# Patient Record
Sex: Female | Born: 1975 | Race: White | Hispanic: No | Marital: Married | Smoking: Former smoker
Health system: Southern US, Community
[De-identification: ages and names within clinical notes are randomized; demographics above are authoritative.]

## PROBLEM LIST (undated history)

## (undated) DIAGNOSIS — G894 Chronic pain syndrome: Secondary | ICD-10-CM

## (undated) DIAGNOSIS — N189 Chronic kidney disease, unspecified: Secondary | ICD-10-CM

## (undated) DIAGNOSIS — M797 Fibromyalgia: Secondary | ICD-10-CM

## (undated) DIAGNOSIS — G43909 Migraine, unspecified, not intractable, without status migrainosus: Secondary | ICD-10-CM

## (undated) DIAGNOSIS — I951 Orthostatic hypotension: Secondary | ICD-10-CM

## (undated) DIAGNOSIS — G5600 Carpal tunnel syndrome, unspecified upper limb: Secondary | ICD-10-CM

## (undated) DIAGNOSIS — I471 Supraventricular tachycardia, unspecified: Secondary | ICD-10-CM

## (undated) DIAGNOSIS — G43009 Migraine without aura, not intractable, without status migrainosus: Secondary | ICD-10-CM

## (undated) DIAGNOSIS — R002 Palpitations: Secondary | ICD-10-CM

## (undated) DIAGNOSIS — L4050 Arthropathic psoriasis, unspecified: Secondary | ICD-10-CM

## (undated) DIAGNOSIS — I341 Nonrheumatic mitral (valve) prolapse: Secondary | ICD-10-CM

## (undated) DIAGNOSIS — M5416 Radiculopathy, lumbar region: Secondary | ICD-10-CM

## (undated) DIAGNOSIS — M659 Unspecified synovitis and tenosynovitis, unspecified site: Secondary | ICD-10-CM

## (undated) DIAGNOSIS — I10 Essential (primary) hypertension: Secondary | ICD-10-CM

## (undated) DIAGNOSIS — E7800 Pure hypercholesterolemia, unspecified: Secondary | ICD-10-CM

## (undated) DIAGNOSIS — R0789 Other chest pain: Secondary | ICD-10-CM

## (undated) DIAGNOSIS — M533 Sacrococcygeal disorders, not elsewhere classified: Secondary | ICD-10-CM

## (undated) HISTORY — PX: CERVICAL SPINE SURGERY: SHX589

## (undated) HISTORY — DX: Chronic kidney disease, unspecified: N18.9

## (undated) HISTORY — PX: CARDIAC SURGERY: SHX584

## (undated) HISTORY — PX: WRIST SURGERY: SHX841

## (undated) HISTORY — PX: ABDOMINAL HYSTERECTOMY: SHX81

## (undated) HISTORY — PX: CARDIAC ELECTROPHYSIOLOGY MAPPING AND ABLATION: SHX1292

---

## 2014-02-13 ENCOUNTER — Ambulatory Visit: Payer: Self-pay

## 2014-07-03 ENCOUNTER — Ambulatory Visit: Payer: Self-pay | Admitting: Physician Assistant

## 2014-09-09 IMAGING — CR DG SHOULDER 2+V*R*
3 series · 3 of 3 positions shown · non-contrast
Comparison: None.

CLINICAL DATA: Right shoulder pain

EXAM:
RIGHT SHOULDER - 2+ VIEW

[si joints obli]
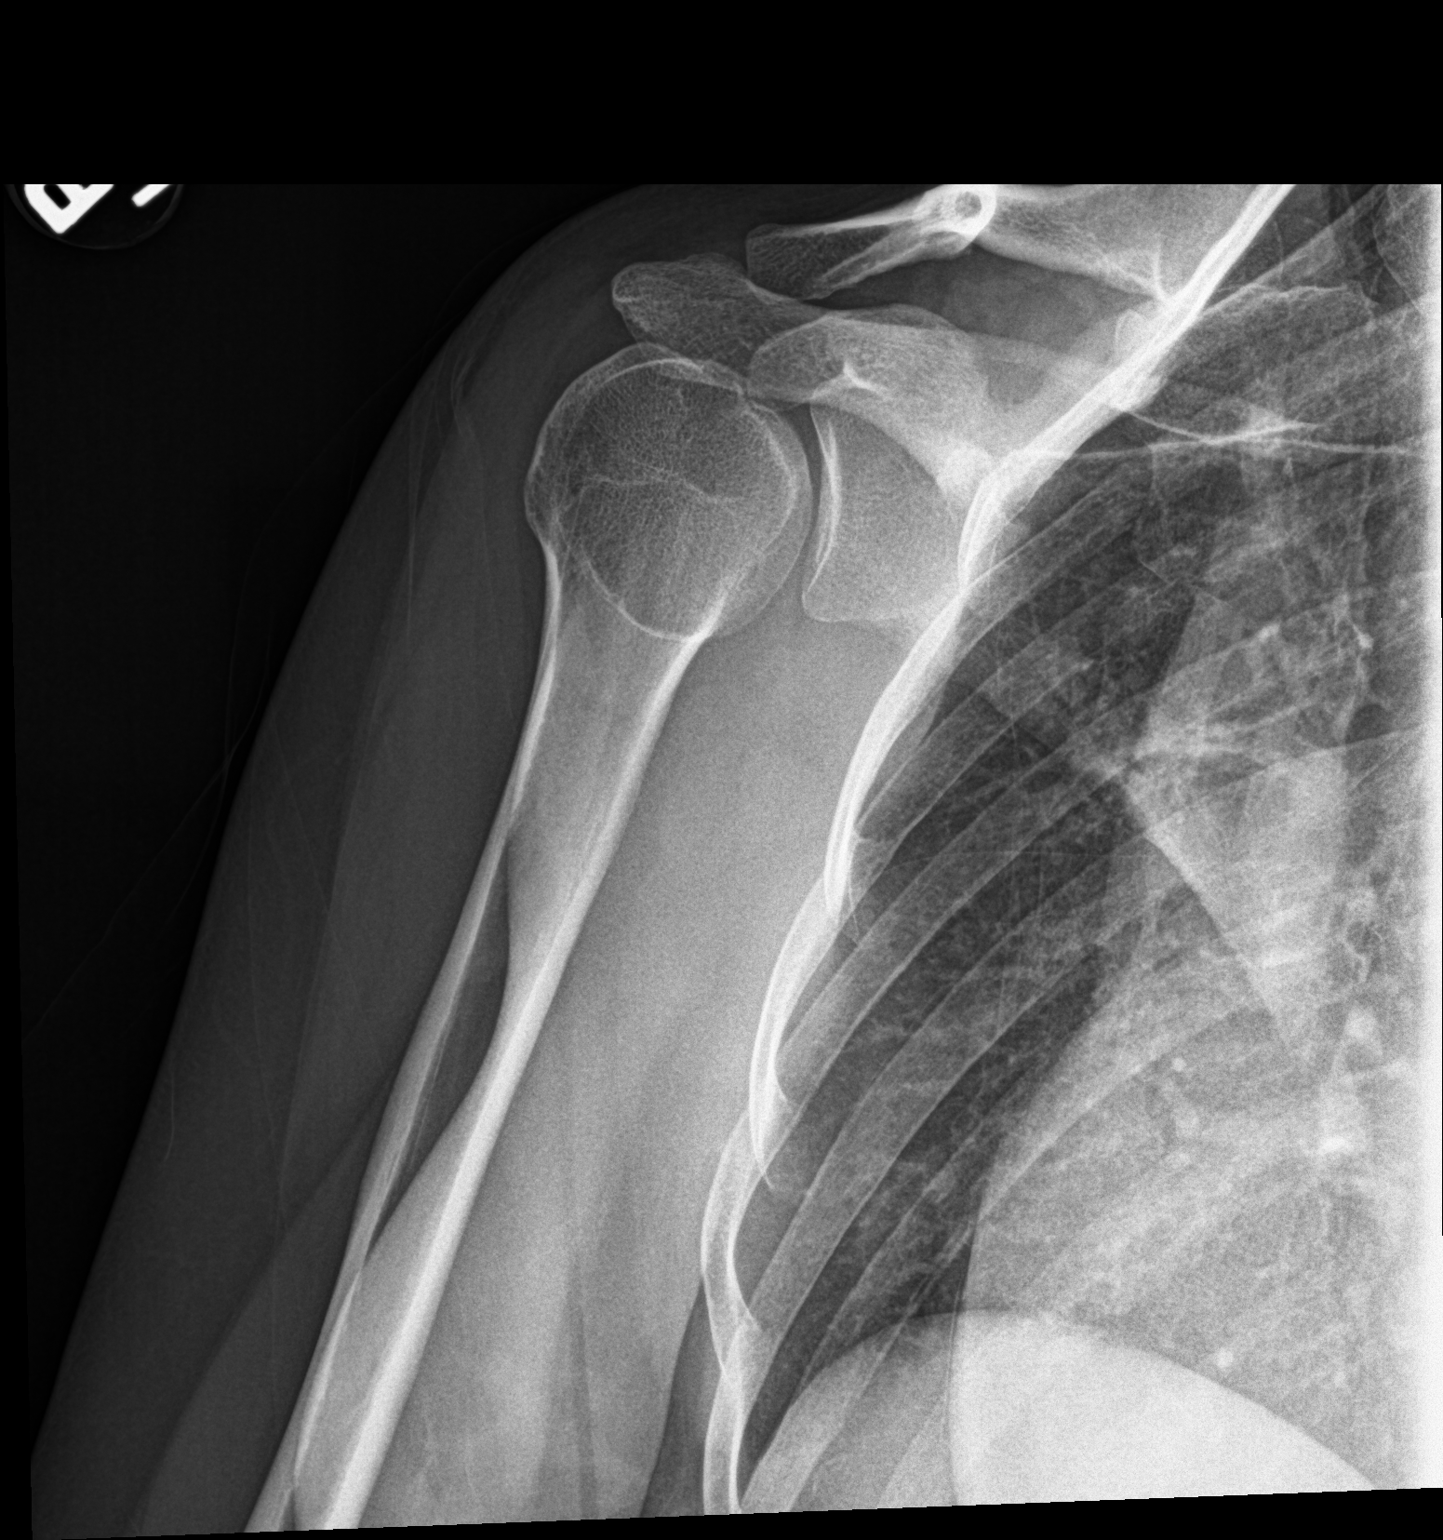

[shoulder axial]
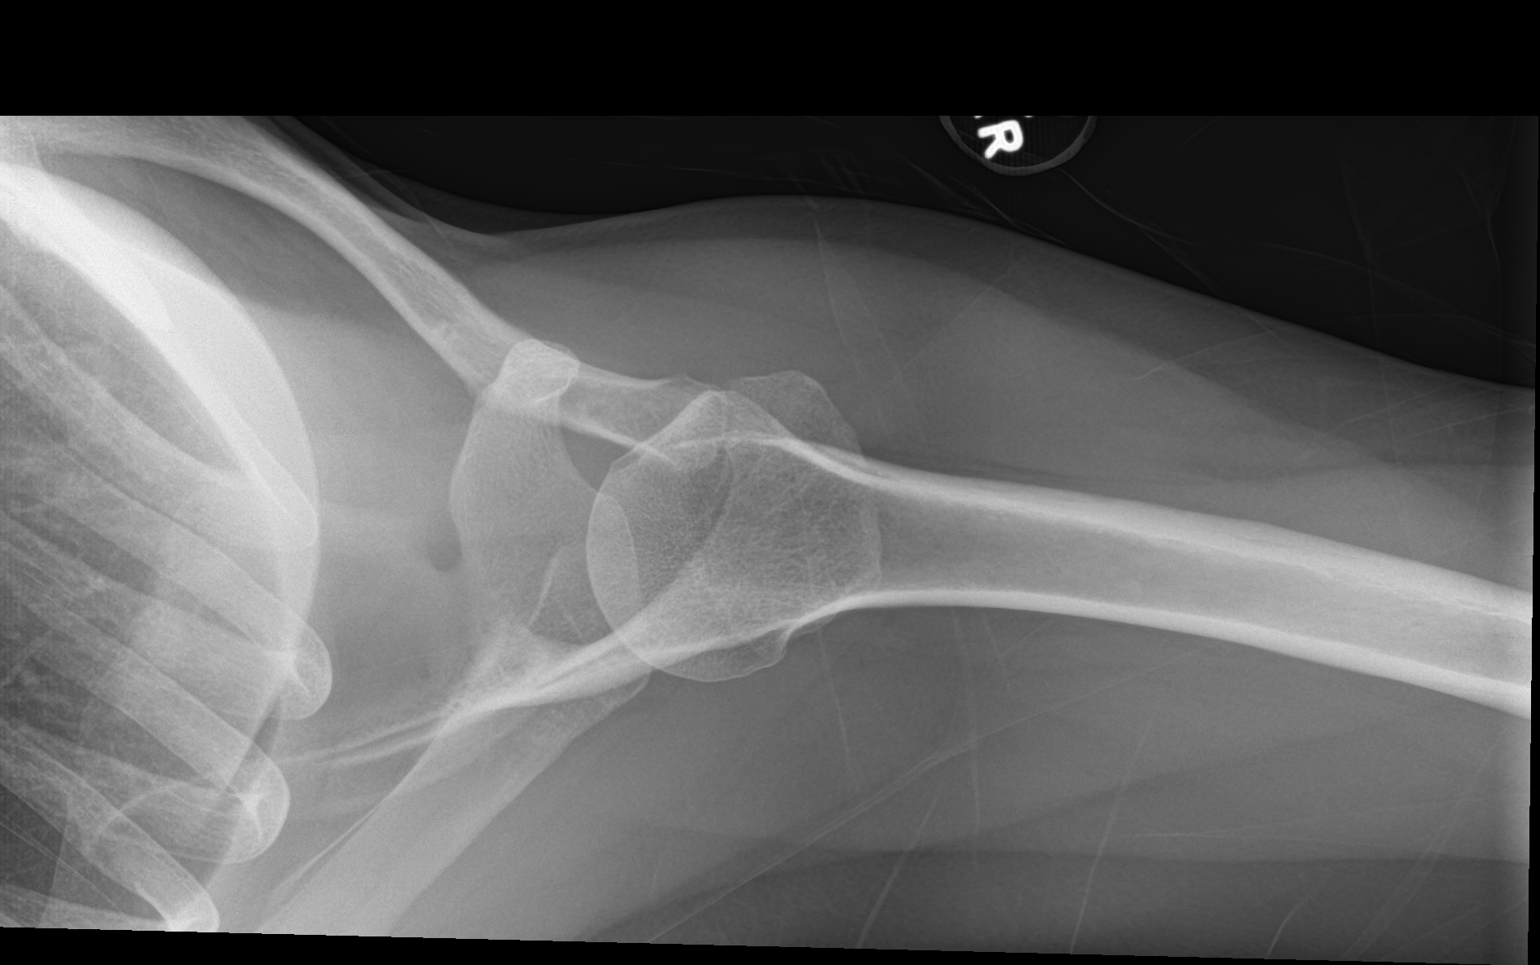

[shoulder ap internal]
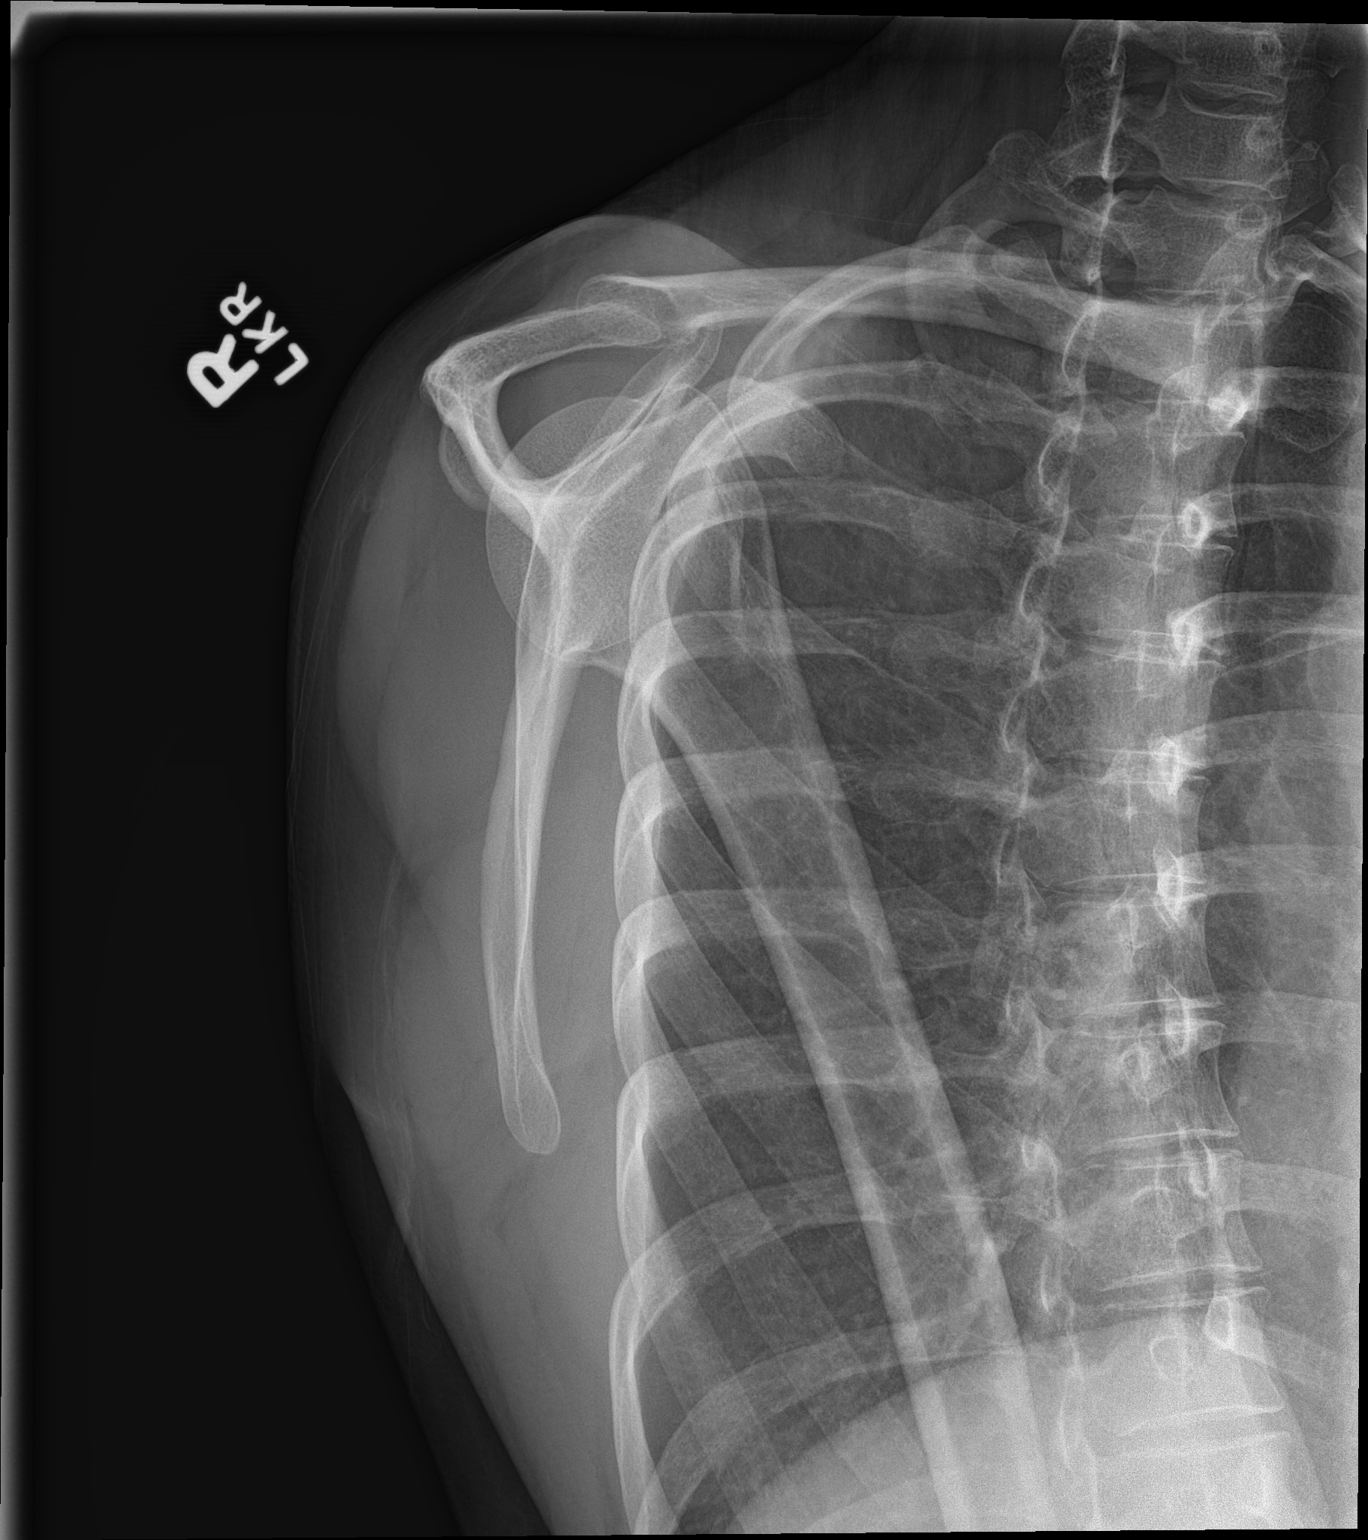

[3 of 3 positions shown; findings below may reference images not displayed]

FINDINGS: There is no evidence of fracture or dislocation. There is no
evidence of arthropathy or other focal bone abnormality. Soft
tissues are unremarkable.
IMPRESSION: Negative.

## 2015-02-20 IMAGING — US US BREAST*L* LIMITED INC AXILLA
1 series · 11 of 11 positions shown · non-contrast
Comparison: Previous exam(s).

CLINICAL DATA: Screening recall for possible bilateral masses from
baseline exam. Additionally, the patient describes diffuse nonfocal
bilateral breast pain.

EXAM:
2D DIGITAL DIAGNOSTIC BILATERAL MAMMOGRAM WITH CAD AND ADJUNCT TOMO
BILATERAL BREAST ULTRASOUND

[Series 1: us breast*left* limited inc axilla · 0.06mm/px · 11 of 11 slices shown]
[im 1/11]
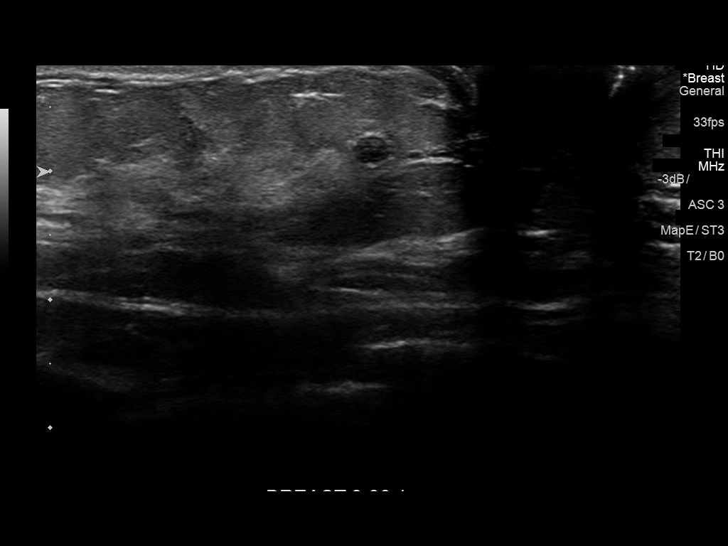
[im 2/11]
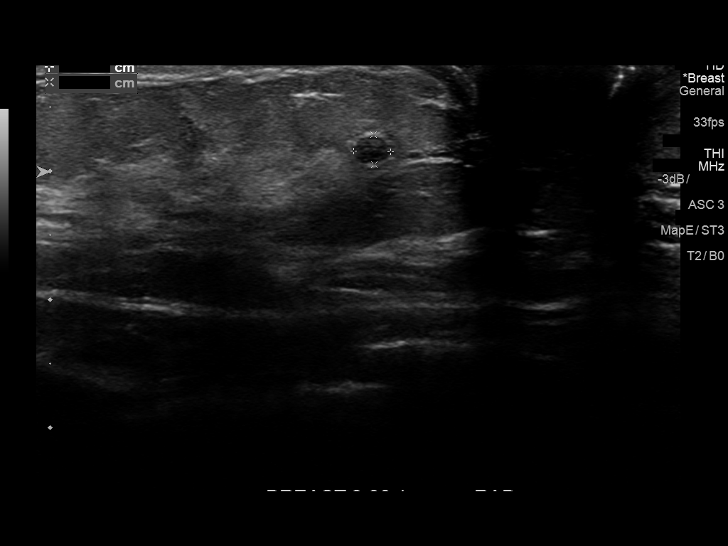
[im 3/11]
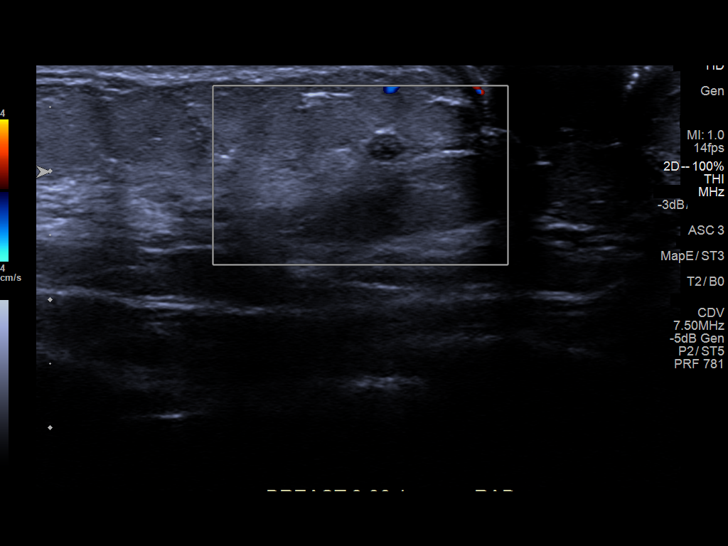
[im 4/11]
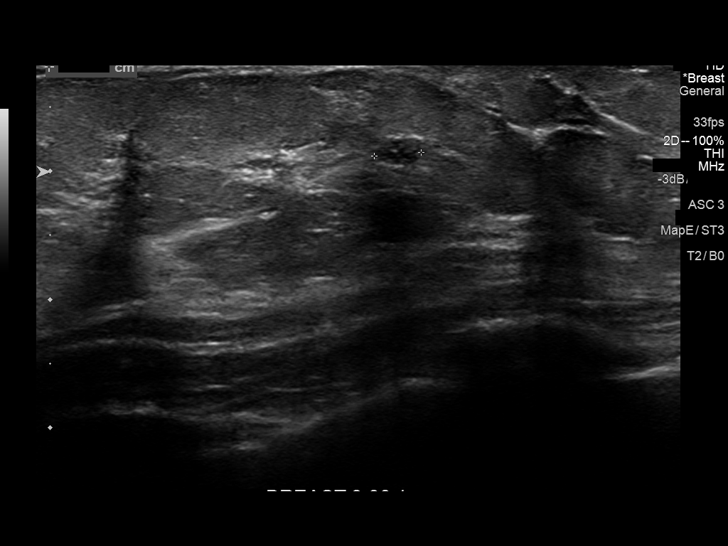
[im 5/11]
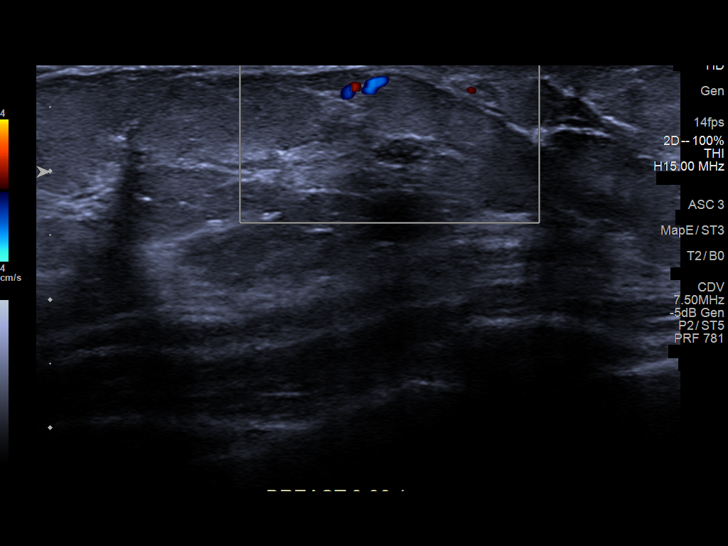
[im 6/11]
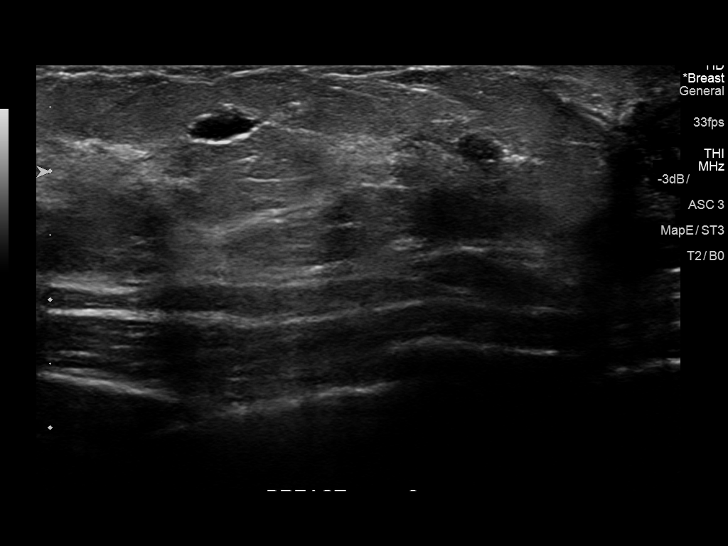
[im 7/11]
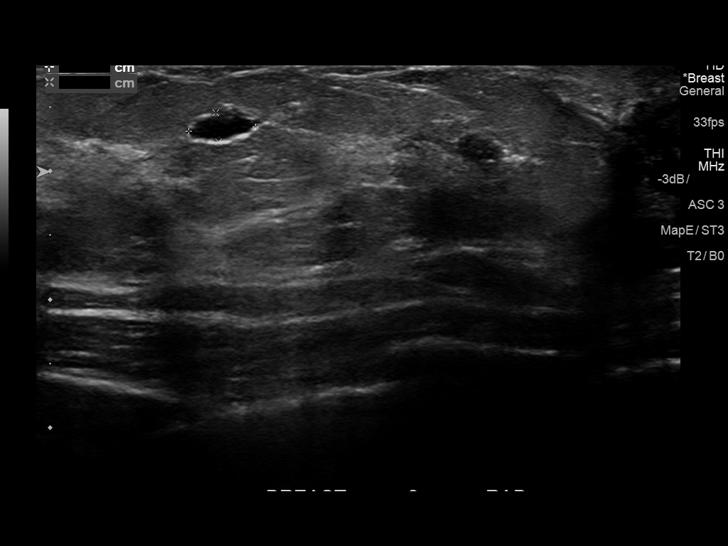
[im 8/11]
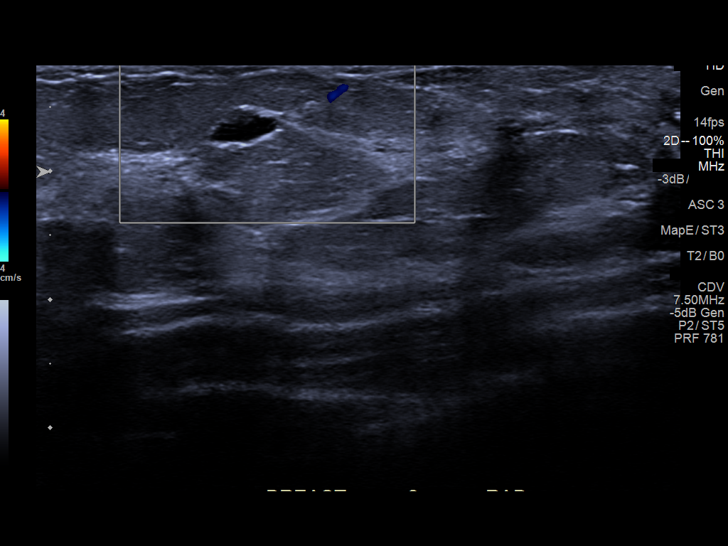
[im 9/11]
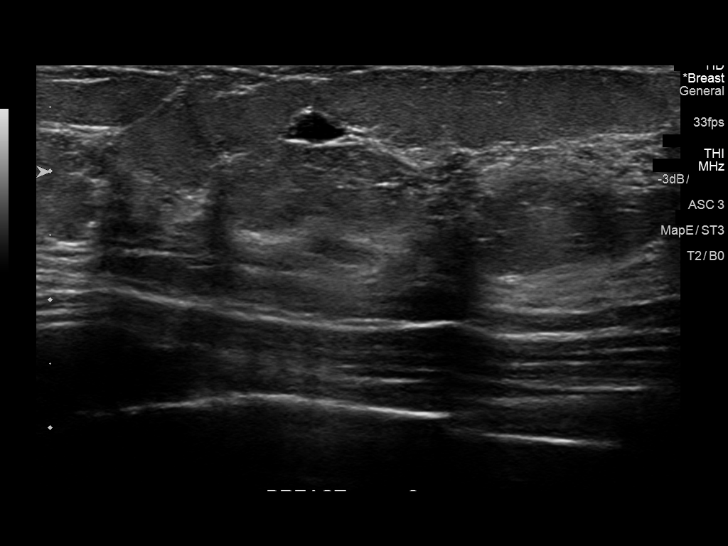
[im 10/11]
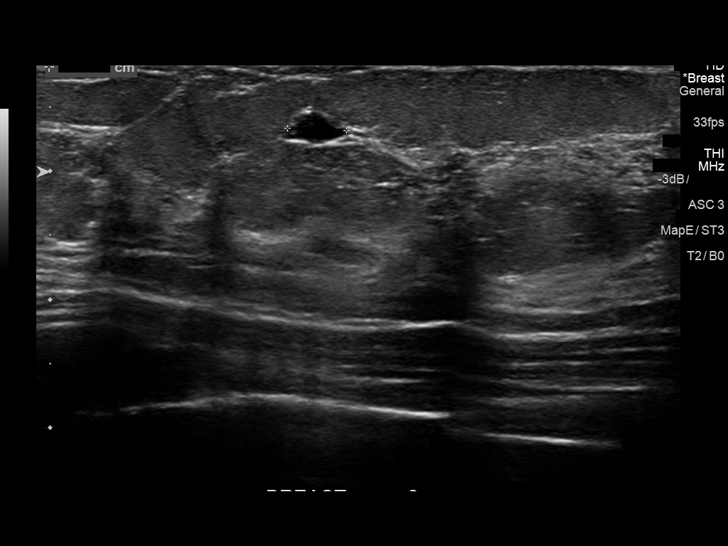
[im 11/11]
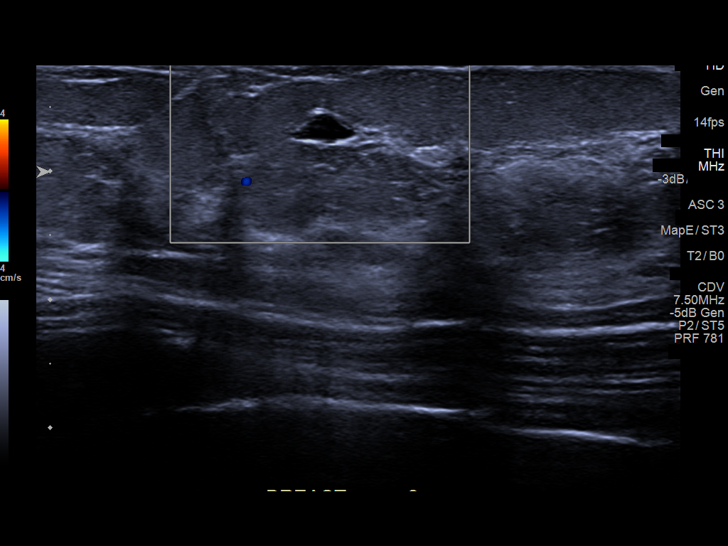

[11 of 11 positions shown; findings below may reference images not displayed]

ACR Breast Density Category c: The breast tissue is heterogeneously
dense, which may obscure small masses.
FINDINGS: No definite masses are identified on the additional spot compression
tomosynthesis images of the right breast, however due to the density
in this region, ultrasound will be performed for further evaluation.
In the slightly medial left breast, posterior depth there is a
circumscribed oval mass measuring 5 mm.

Mammographic images were processed with CAD.

Physical exam of the lower outer and lower inner right breast
demonstrates no discrete palpable masses.

Physical exam of the medial left breast demonstrates no discrete
palpable masses.

Ultrasound of the inferior right breast which was scanned from the 4
o'clock to the 8 o'clock position demonstrates multiple
circumscribed oval anechoic masses compatible with benign cysts. No
solid masses or suspicious areas of shadowing are identified.

Ultrasound of the medial left breast demonstrates a few small
anechoic circumscribed masses. There is a 5 mm hypoechoic mass at 9
o'clock, 1 cm from the nipple, likely representing a complicated
cyst.
IMPRESSION: 1. There is a 5 mm probably benign mass in the left breast at 9
o'clock, favored to represent a complicated cyst.

2. Benign bilateral fibrocystic change is noted. No mammographic
evidence of malignancy in the bilateral breasts.

RECOMMENDATION:
Six-month follow-up ultrasound of the left breast is recommended to
ensure continued stability of the probably benign 5 mm mass at 9
o'clock.

I have discussed the findings and recommendations with the patient.
Results were also provided in writing at the conclusion of the
visit. If applicable, a reminder letter will be sent to the patient
regarding the next appointment.

BI-RADS CATEGORY  3: Probably benign.

## 2015-02-20 IMAGING — US US BREAST*R* LIMITED INC AXILLA
1 series · 13 of 24 positions shown · non-contrast
Comparison: Previous exam(s).

CLINICAL DATA: Screening recall for possible bilateral masses from
baseline exam. Additionally, the patient describes diffuse nonfocal
bilateral breast pain.

EXAM:
2D DIGITAL DIAGNOSTIC BILATERAL MAMMOGRAM WITH CAD AND ADJUNCT TOMO
BILATERAL BREAST ULTRASOUND

[Series 1: us breast*right* limited inc axilla · 0.06mm/px · 13 of 24 slices shown]
[im 1/24]
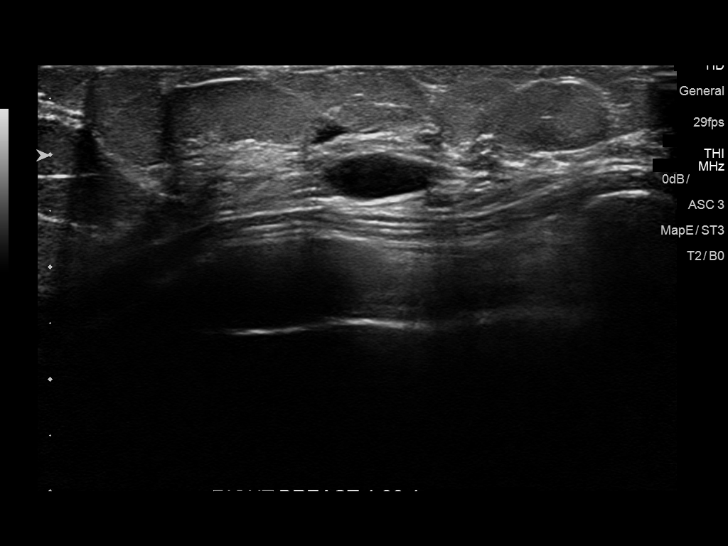
[im 3/24]
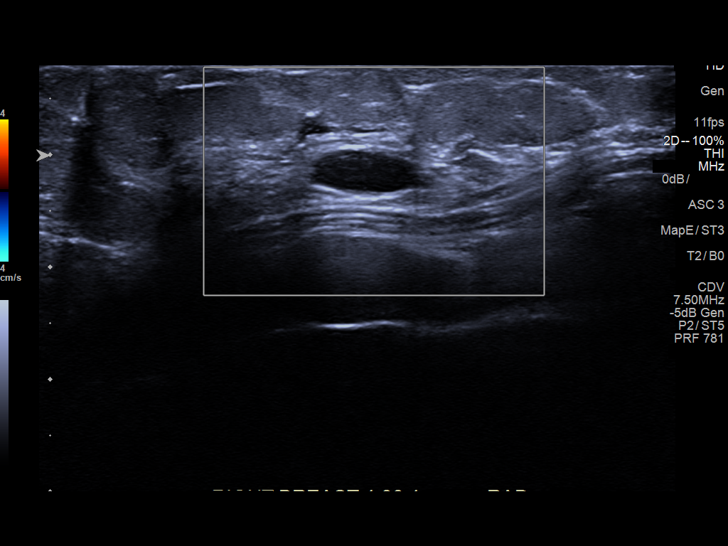
[im 5/24]
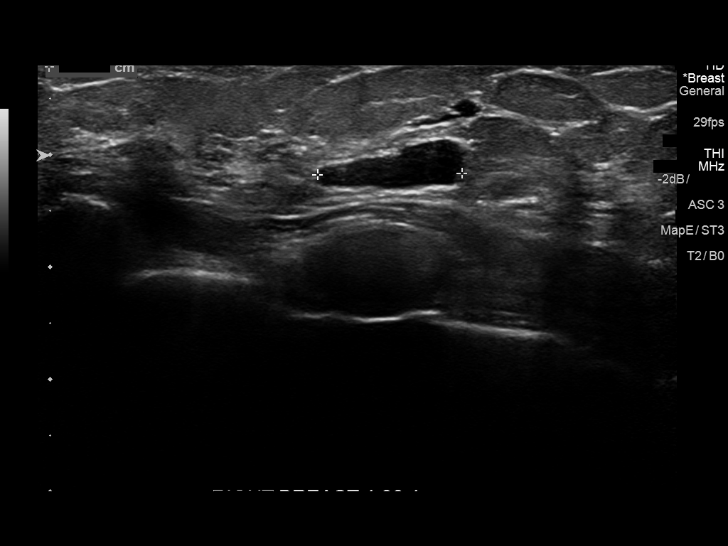
[im 7/24]
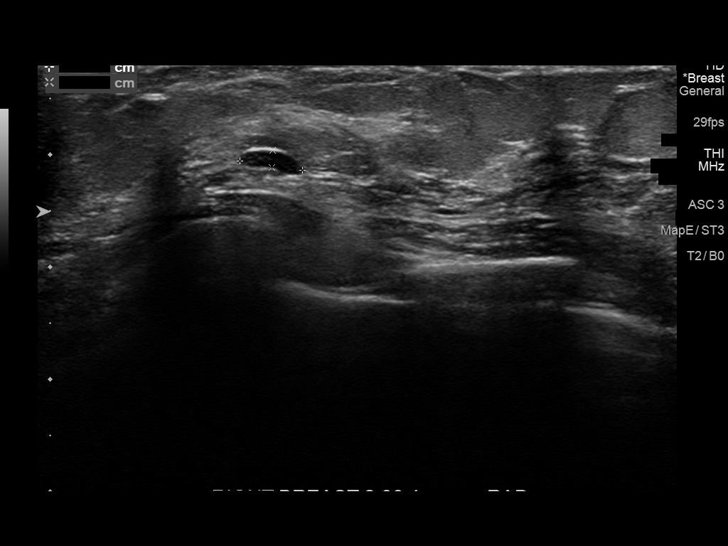
[im 9/24]
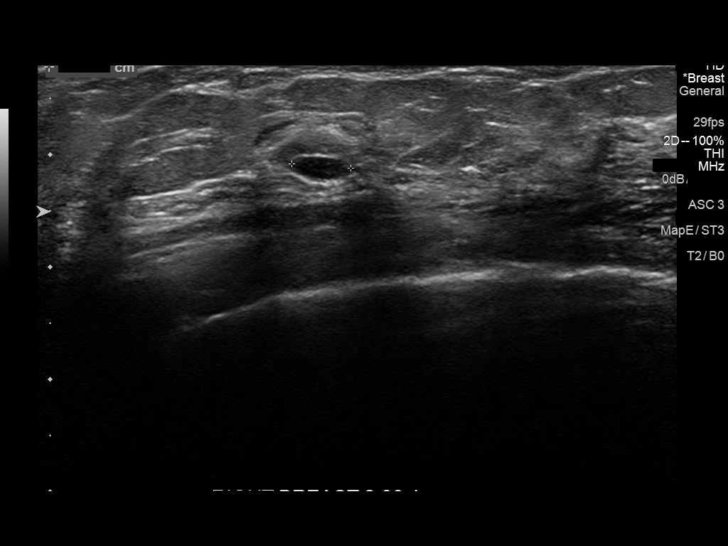
[im 11/24]
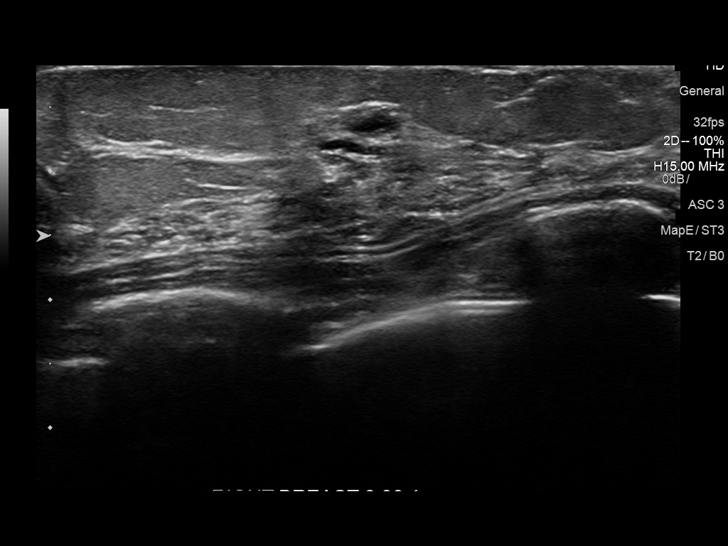
[im 13/24]
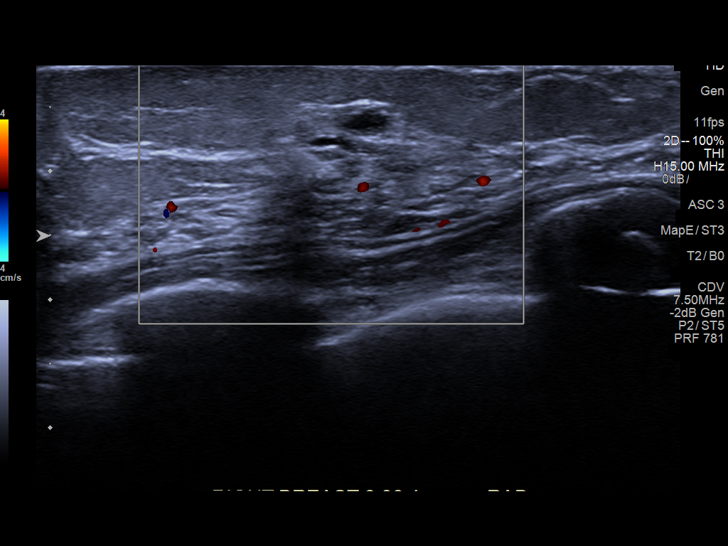
[im 14/24]
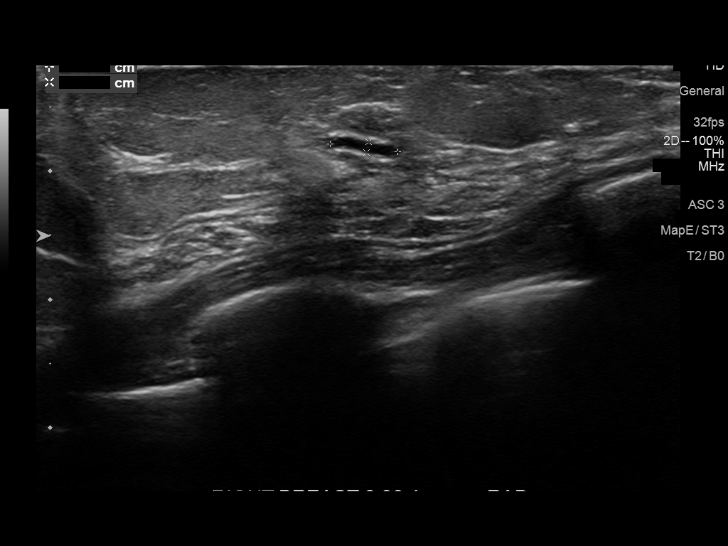
[im 16/24]
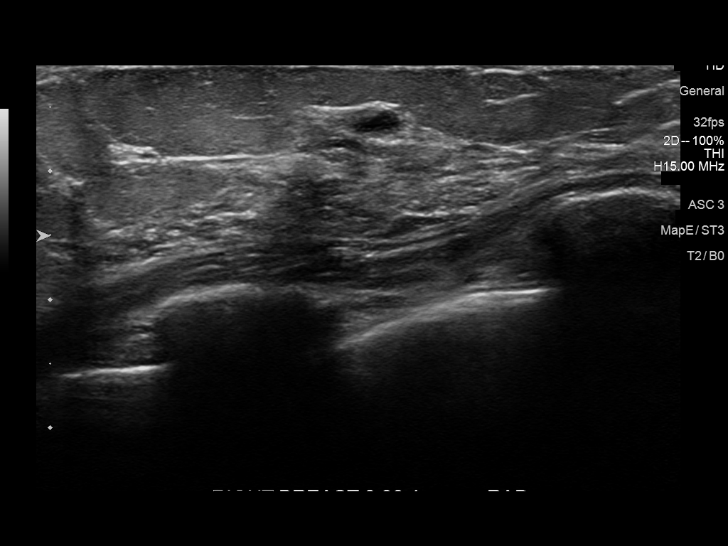
[im 18/24]
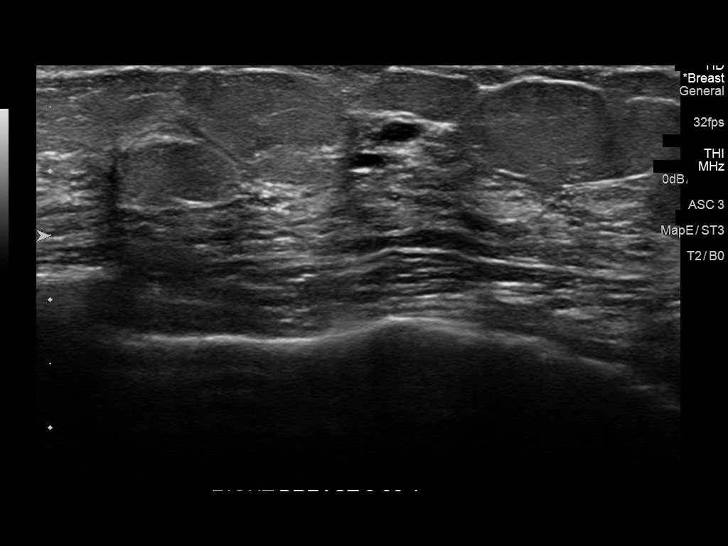
[im 20/24]
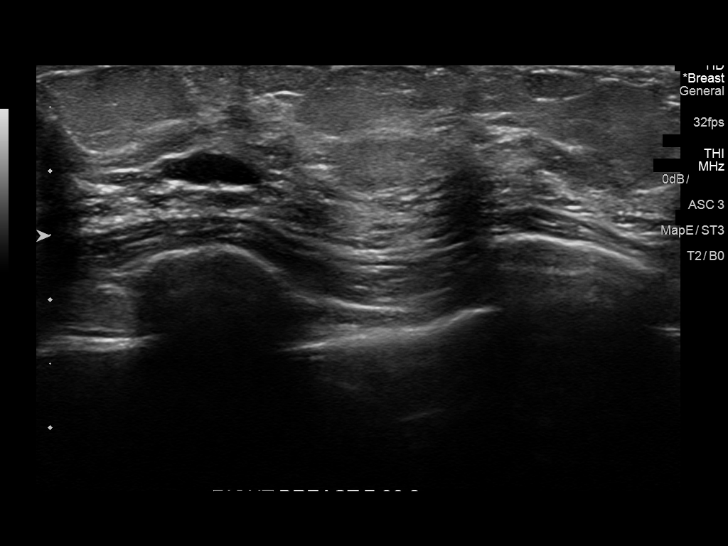
[im 22/24]
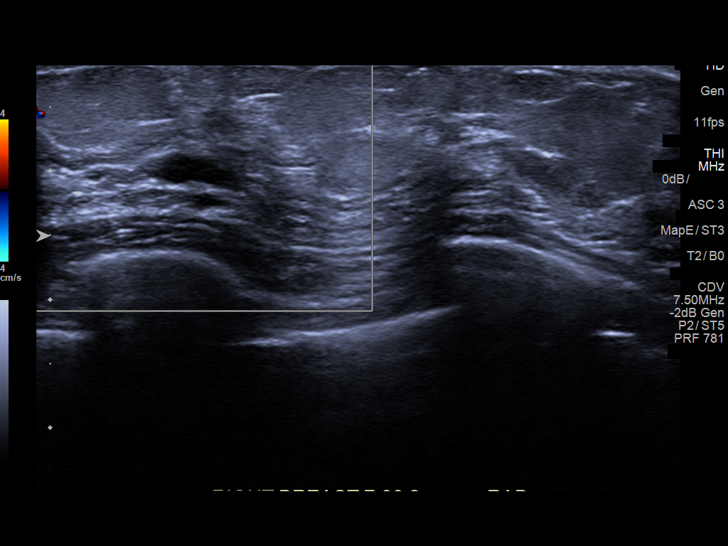
[im 24/24]
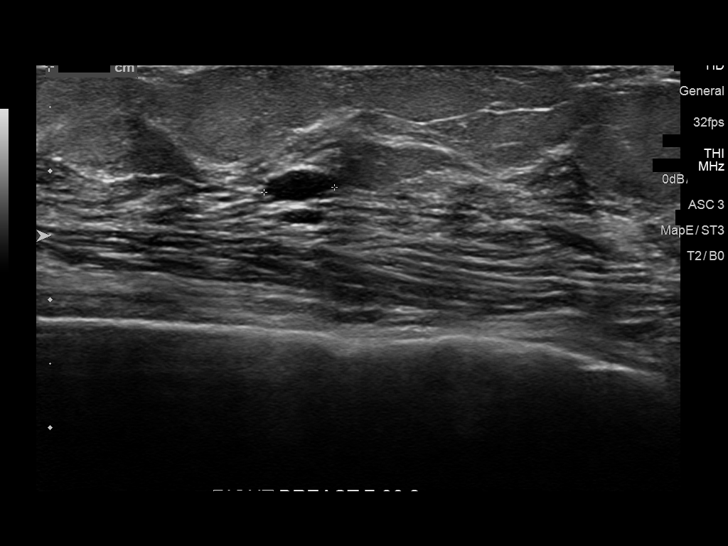

[13 of 24 positions shown; findings below may reference images not displayed]

ACR Breast Density Category c: The breast tissue is heterogeneously
dense, which may obscure small masses.
FINDINGS: No definite masses are identified on the additional spot compression
tomosynthesis images of the right breast, however due to the density
in this region, ultrasound will be performed for further evaluation.
In the slightly medial left breast, posterior depth there is a
circumscribed oval mass measuring 5 mm.

Mammographic images were processed with CAD.

Physical exam of the lower outer and lower inner right breast
demonstrates no discrete palpable masses.

Physical exam of the medial left breast demonstrates no discrete
palpable masses.

Ultrasound of the inferior right breast which was scanned from the 4
o'clock to the 8 o'clock position demonstrates multiple
circumscribed oval anechoic masses compatible with benign cysts. No
solid masses or suspicious areas of shadowing are identified.

Ultrasound of the medial left breast demonstrates a few small
anechoic circumscribed masses. There is a 5 mm hypoechoic mass at 9
o'clock, 1 cm from the nipple, likely representing a complicated
cyst.
IMPRESSION: 1. There is a 5 mm probably benign mass in the left breast at 9
o'clock, favored to represent a complicated cyst.

2. Benign bilateral fibrocystic change is noted. No mammographic
evidence of malignancy in the bilateral breasts.

RECOMMENDATION:
Six-month follow-up ultrasound of the left breast is recommended to
ensure continued stability of the probably benign 5 mm mass at 9
o'clock.

I have discussed the findings and recommendations with the patient.
Results were also provided in writing at the conclusion of the
visit. If applicable, a reminder letter will be sent to the patient
regarding the next appointment.

BI-RADS CATEGORY  3: Probably benign.

## 2015-04-05 ENCOUNTER — Ambulatory Visit
Admit: 2015-04-05 | Disposition: A | Discharge status: Home / Self Care | Payer: Self-pay | Attending: Family Medicine | Admitting: Family Medicine

## 2015-10-16 ENCOUNTER — Encounter: Payer: Self-pay | Admitting: Emergency Medicine

## 2015-10-16 ENCOUNTER — Emergency Department: Payer: Self-pay

## 2015-10-16 ENCOUNTER — Emergency Department
Admission: EM | Admit: 2015-10-16 | Discharge: 2015-10-16 | Disposition: A | Discharge status: Home / Self Care | Payer: Self-pay | Attending: Emergency Medicine | Admitting: Emergency Medicine

## 2015-10-16 DIAGNOSIS — G43109 Migraine with aura, not intractable, without status migrainosus: Secondary | ICD-10-CM | POA: Insufficient documentation

## 2015-10-16 LAB — CBC
HEMATOCRIT: 39.8 % (ref 35.0–47.0)
HEMOGLOBIN: 13.3 g/dL (ref 12.0–16.0)
MCH: 32 pg (ref 26.0–34.0)
MCHC: 33.5 g/dL (ref 32.0–36.0)
MCV: 95.5 fL (ref 80.0–100.0)
Platelets: 341 10*3/uL (ref 150–440)
RBC: 4.16 MIL/uL (ref 3.80–5.20)
RDW: 12.1 % (ref 11.5–14.5)
WBC: 7.5 10*3/uL (ref 3.6–11.0)

## 2015-10-16 LAB — BASIC METABOLIC PANEL
ANION GAP: 6 (ref 5–15)
BUN: 10 mg/dL (ref 6–20)
CHLORIDE: 104 mmol/L (ref 101–111)
CO2: 28 mmol/L (ref 22–32)
Calcium: 9.3 mg/dL (ref 8.9–10.3)
Creatinine, Ser: 0.73 mg/dL (ref 0.44–1.00)
Glucose, Bld: 95 mg/dL (ref 65–99)
POTASSIUM: 3.9 mmol/L (ref 3.5–5.1)
SODIUM: 138 mmol/L (ref 135–145)

## 2015-10-16 LAB — URINALYSIS COMPLETE WITH MICROSCOPIC (ARMC ONLY)
Bilirubin Urine: NEGATIVE
Glucose, UA: NEGATIVE mg/dL
Ketones, ur: NEGATIVE mg/dL
LEUKOCYTES UA: NEGATIVE
NITRITE: NEGATIVE
PH: 6 (ref 5.0–8.0)
Protein, ur: NEGATIVE mg/dL
SPECIFIC GRAVITY, URINE: 1.005 (ref 1.005–1.030)

## 2015-10-16 IMAGING — CT CT HEAD W/O CM
1 series · 16 of 30 positions shown, 20 images · non-contrast
Comparison: None.

CLINICAL DATA: Migraine headache.

EXAM:
CT HEAD WITHOUT CONTRAST
TECHNIQUE: Contiguous axial images were obtained from the base of the skull
through the vertex without intravenous contrast.

[Series 2: head wo · axial · 0.39mm/px · z∈[-198,-54]mm · 16 of 36 slices shown, 20 images]
[im 2/36  brain]
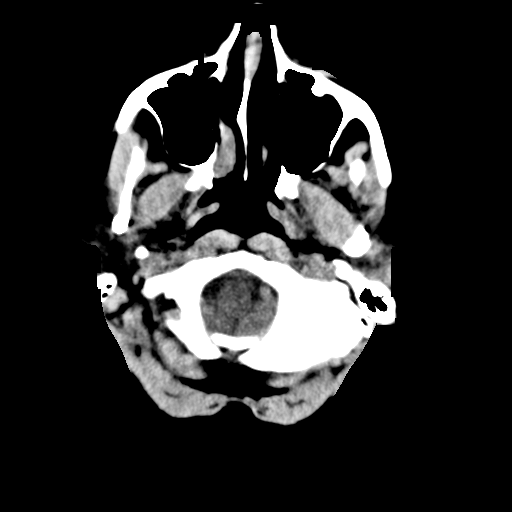
[im 2/36  bone]
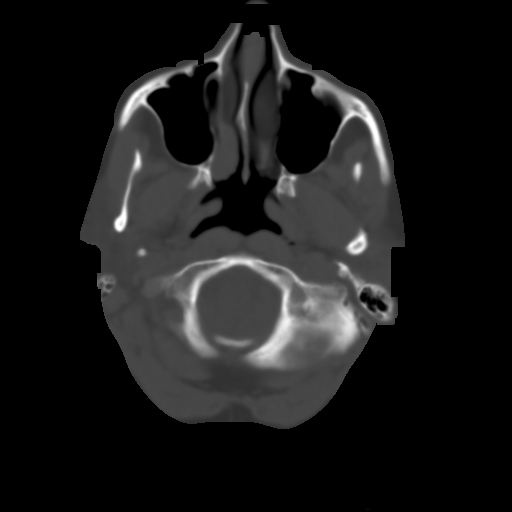
[im 4/36  brain]
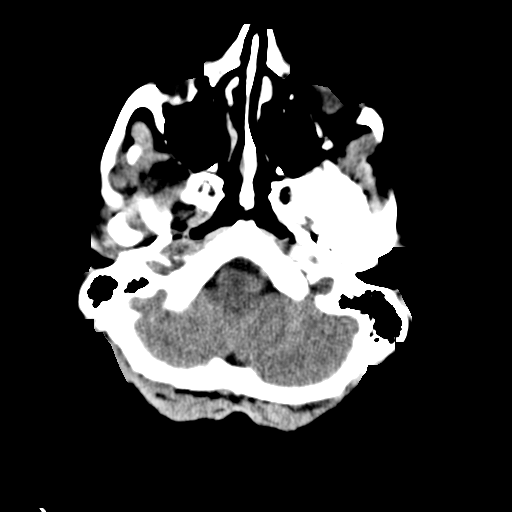
[im 7/36  brain]
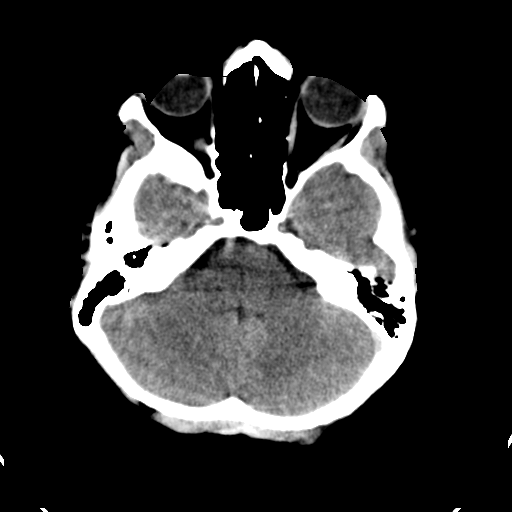
[im 9/36  brain]
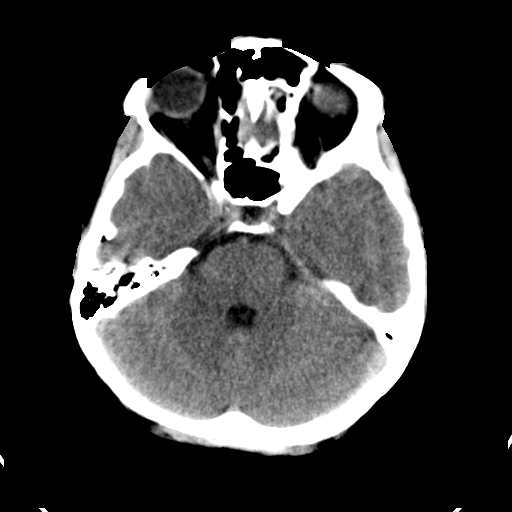
[im 10/36  brain]
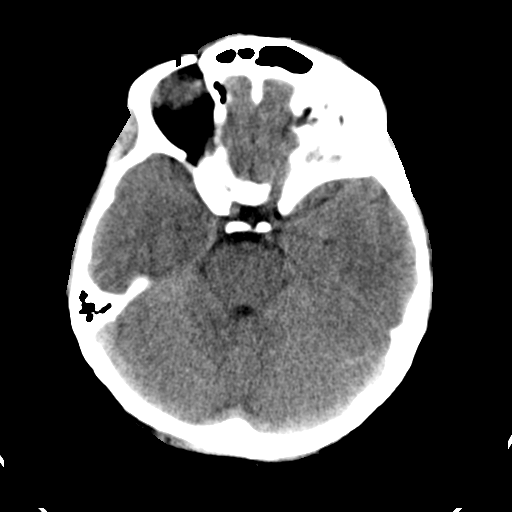
[im 10/36  bone]
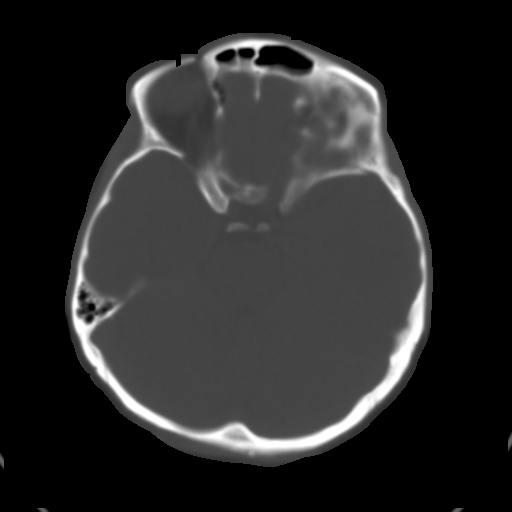
[im 13/36  brain]
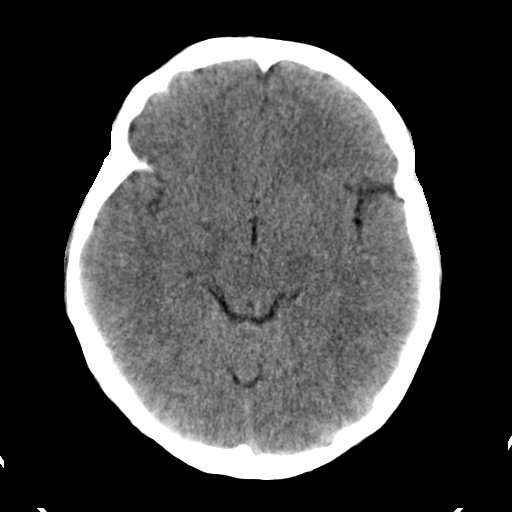
[im 15/36  brain]
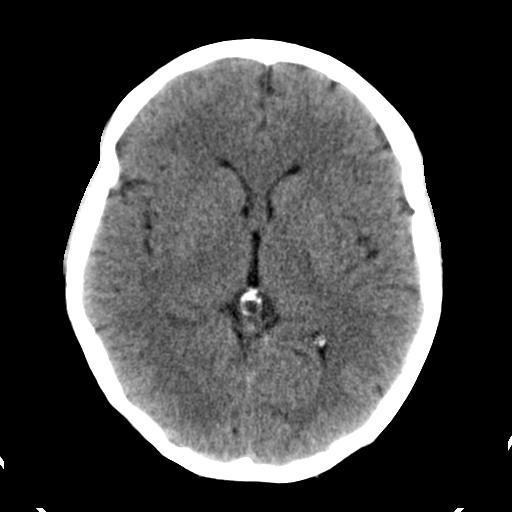
[im 17/36  brain]
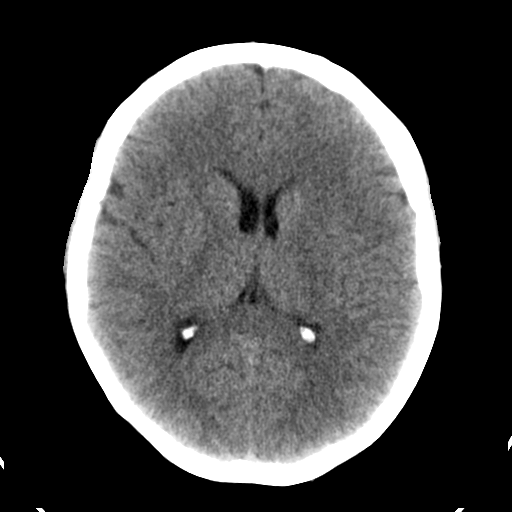
[im 19/36  brain]
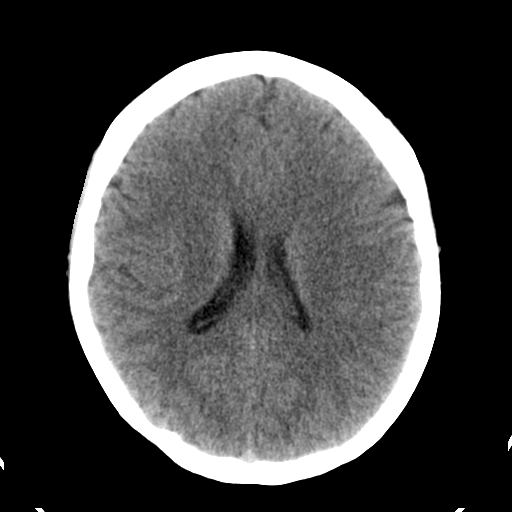
[im 19/36  bone]
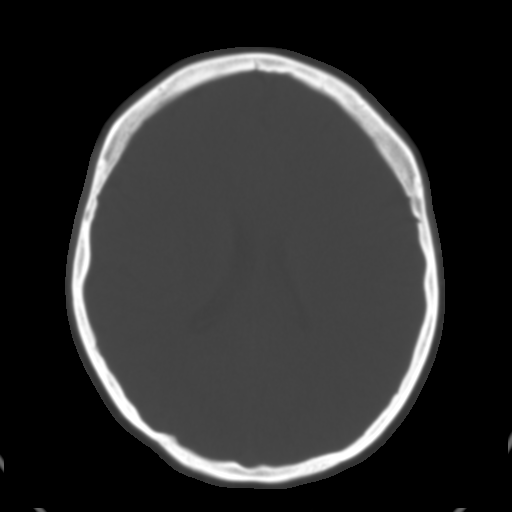
[im 21/36  brain]
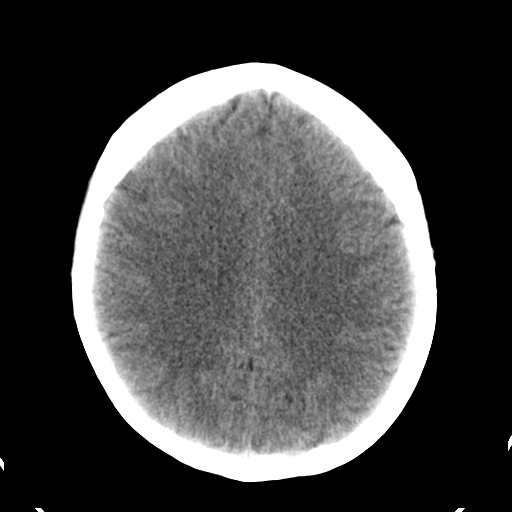
[im 23/36  brain]
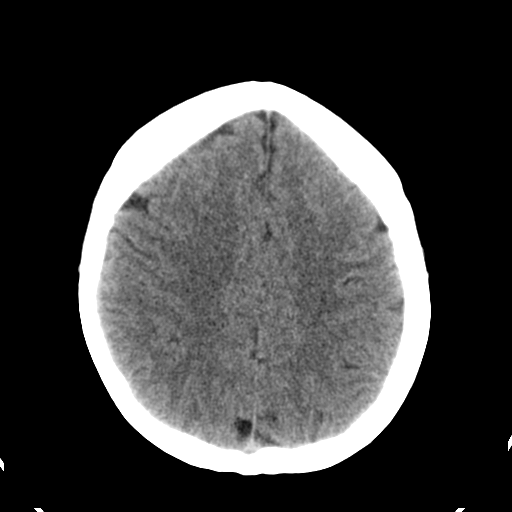
[im 26/36  brain]
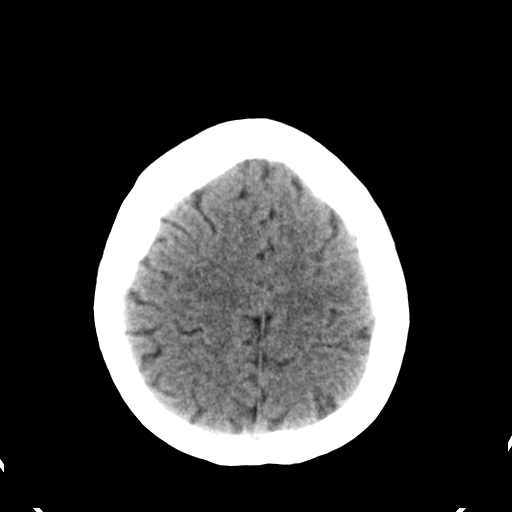
[im 27/36  brain]
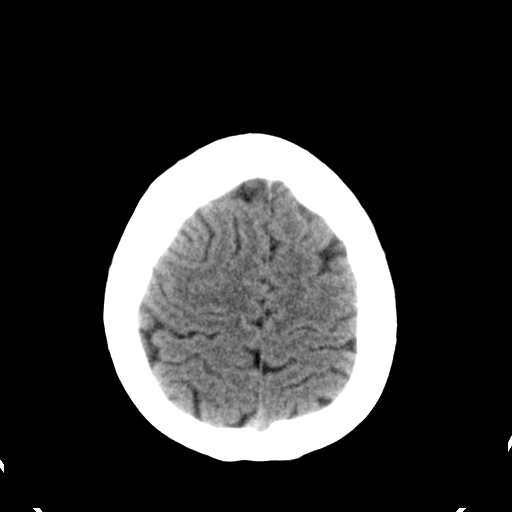
[im 27/36  bone]
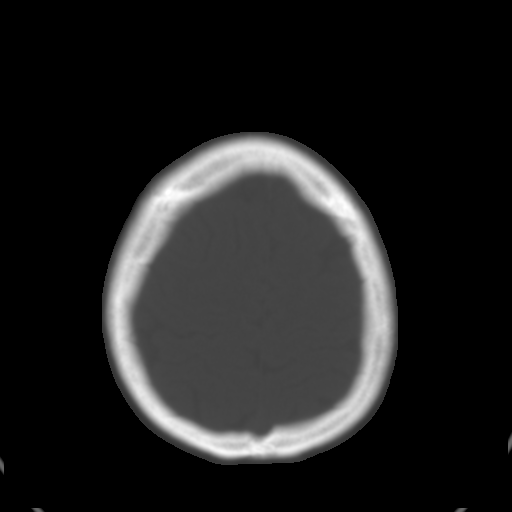
[im 29/36  brain]
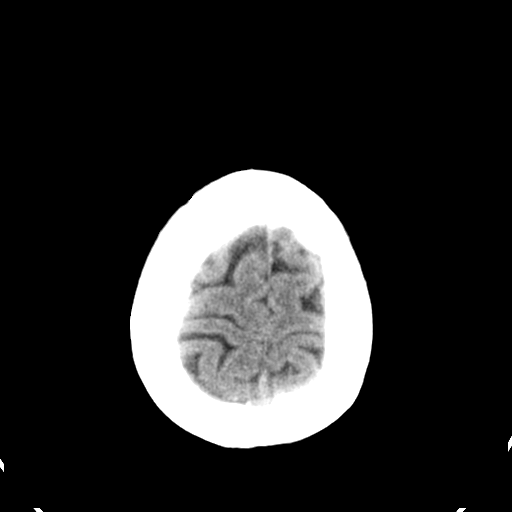
[im 32/36  brain]
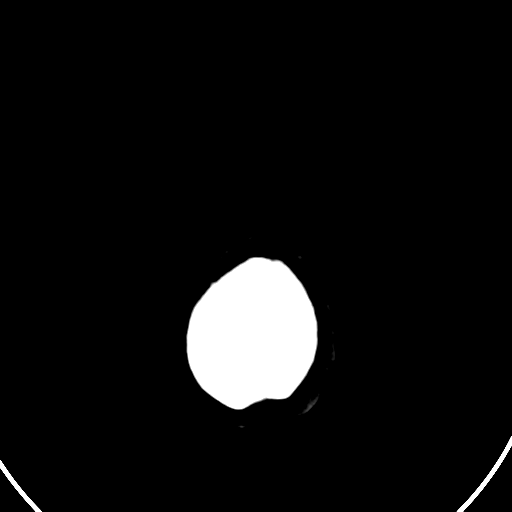
[im 34/36  brain]
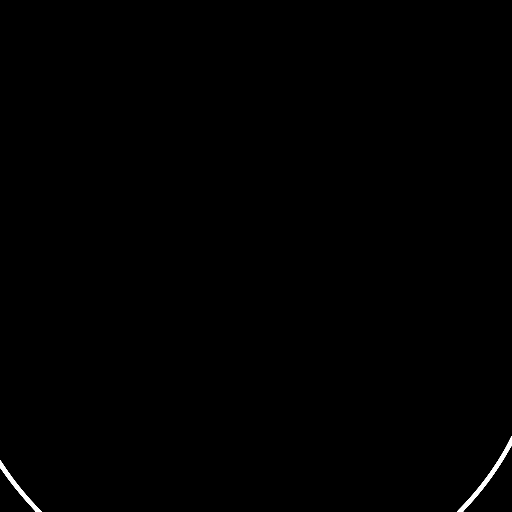

[16 of 30 positions shown; findings below may reference images not displayed]

FINDINGS: Bony calvarium appears intact. No mass effect or midline shift is
noted. Ventricular size is within normal limits. There is no
evidence of mass lesion, hemorrhage or acute infarction.
IMPRESSION: Normal head CT.

## 2015-10-16 MED ORDER — DIPHENHYDRAMINE HCL 50 MG/ML IJ SOLN
25.0000 mg | Freq: Once | INTRAMUSCULAR | Status: AC
  Administered 2015-10-16: 25 mg via INTRAVENOUS
  Filled 2015-10-16: qty 1

## 2015-10-16 MED ORDER — KETOROLAC TROMETHAMINE 30 MG/ML IJ SOLN
30.0000 mg | Freq: Once | INTRAMUSCULAR | Status: AC
  Administered 2015-10-16: 30 mg via INTRAVENOUS
  Filled 2015-10-16: qty 1

## 2015-10-16 MED ORDER — BUTALBITAL-APAP-CAFFEINE 50-325-40 MG PO TABS: 1.0000 | ORAL_TABLET | Freq: Four times a day (QID) | ORAL | Status: DC | PRN

## 2015-10-16 MED ORDER — SODIUM CHLORIDE 0.9 % IV SOLN
1000.0000 mL | Freq: Once | INTRAVENOUS | Status: AC
  Administered 2015-10-16: 1000 mL via INTRAVENOUS

## 2015-10-16 MED ORDER — METOCLOPRAMIDE HCL 5 MG/ML IJ SOLN
20.0000 mg | Freq: Once | INTRAVENOUS | Status: AC
  Administered 2015-10-16: 20 mg via INTRAVENOUS
  Filled 2015-10-16: qty 4

## 2015-10-16 NOTE — ED Provider Notes (Signed)
Sign out was to follow-up with patient's pain and reassess her for resolution of her migraine symptoms. Physical Exam  BP 117/84 mmHg  Pulse 92  Temp(Src) 98.2 F (36.8 C) (Oral)  Resp 16  Ht 5\' 4"  (1.626 m)  Wt 130 lb (58.968 kg)  BMI 22.30 kg/m2  SpO2 98% ----------------------------------------- 3:33 PM on 10/16/2015 -----------------------------------------   Physical Exam Patient resting comfortably at this time. She says the headache is now resolved. ED Course  Procedures  MDM Patient with migrainous type headache. We'll discharge with Fioricet as well as follow up with primary care. Patient understands the plan and is willing to comply.      Myrna Blazeravid Matthew Rosaisela Jamroz, MD 10/16/15 1534

## 2015-10-16 NOTE — ED Provider Notes (Signed)
[facility] Emergency Department Provider Note  ____________________________________________  Time seen: On arrival  I have reviewed the triage vital signs and the nursing notes.   HISTORY  Chief Complaint Migraine    HPI Sandra Doyle is a [age] y.o. female who presents with complaints of migraine. She states that she hasn't had one in several years but her symptoms today are consistent with prior episodes. She reports she develops headache which is normally followed by lightheadedness, sensitivity to light and sometimes nausea. She tells me that she did not actually lose consciousness but felt very lightheaded at work which led to her coming to the emergency department. She denies chest pain. No palpitations. No fevers chills or neck pain. No neuro deficits. She feels that she may have had an aura prior to the symptoms which she has had no past as well.     History reviewed. No pertinent past medical history. Migraines There are no active problems to display for this patient.   Past Surgical History  Procedure Laterality Date  . Cardiac surgery      ablasion  . Abdominal hysterectomy      No current outpatient prescriptions on file.  Allergies Review of patient's allergies indicates no known allergies.  No family history on file.  Social History Social History  Substance Use Topics  . Smoking status: Never Smoker   . Smokeless tobacco: None  . Alcohol Use: No    Review of Systems  Constitutional: Negative for fever. Eyes: Positive for possible aura ENT: Negative for sore throat Cardiovascular: Negative for chest pain. Respiratory: Negative for shortness of breath. Gastrointestinal: Mild nausea Genitourinary: Negative for dysuria. Musculoskeletal: Negative for back pain. Skin: Negative for rash. Neurological: Negative for focal weakness. Positive for headache no neck pain Psychiatric: No  anxiety    ____________________________________________   PHYSICAL EXAM:  VITAL SIGNS: ED Triage Vitals  Enc Vitals Group     BP 10/16/15 1139 117/84 mmHg     Pulse Rate 10/16/15 1139 92     Resp 10/16/15 1139 16     Temp 10/16/15 1139 98.2 F (36.8 C)     Temp Source 10/16/15 1139 Oral     SpO2 10/16/15 1139 98 %     Weight 10/16/15 1139 130 lb (58.968 kg)     Height 10/16/15 1139 5\' 4"  (1.626 m)     Head Cir --      Peak Flow --      Pain Score 10/16/15 1139 7     Pain Loc --      Pain Edu? --      Excl. in GC? --      Constitutional: Alert and oriented. Well appearing and in no distress. Eyes: Conjunctivae are normal. EOMI ENT   Head: Normocephalic and atraumatic.   Mouth/Throat: Mucous membranes are moist. Cardiovascular: Normal rate, regular rhythm. Normal and symmetric distal pulses are present in all extremities.  Respiratory: Normal respiratory effort without tachypnea nor retractions.  Gastrointestinal: Soft and non-tender in all quadrants. No distention. There is no CVA tenderness. Genitourinary: deferred Musculoskeletal: Nontender with normal range of motion in all extremities. No lower extremity tenderness nor edema. Neurologic:  Normal speech and language. No gross focal neurologic deficits are appreciated. Cranial nerves II through XII are intact. Normal strength in all extremities Skin:  Skin is warm, dry and intact. No rash noted. Psychiatric: Mood and affect are normal. Patient exhibits appropriate insight and judgment.  ____________________________________________    LABS (pertinent  positives/negatives)  Labs Reviewed  URINALYSIS COMPLETEWITH MICROSCOPIC ([facility] ONLY) - Abnormal; Notable for the following:    Color, Urine YELLOW (*)    APPearance CLEAR (*)    Hgb urine dipstick 1+ (*)    Bacteria, UA RARE (*)    Squamous Epithelial / LPF 0-5 (*)    All other components within normal limits  BASIC METABOLIC PANEL  CBC     ____________________________________________   EKG  ED ECG REPORT I, Jene Every, the attending physician, personally viewed and interpreted this ECG.  Date: 10/16/2015 EKG Time: 11:41 AM Rate: 74 Rhythm: normal sinus rhythm QRS Axis: normal Intervals: normal ST/T Wave abnormalities: normal Conduction Disutrbances: none Narrative Interpretation: unremarkable   ____________________________________________    RADIOLOGY I have personally reviewed any xrays that were ordered on this patient: CT head is unremarkable  ____________________________________________   PROCEDURES  Procedure(s) performed: none  Critical Care performed:none  ____________________________________________   INITIAL IMPRESSION / ASSESSMENT AND PLAN / ED COURSE  Pertinent labs & imaging results that were available during my care of the patient were reviewed by me and considered in my medical decision making (see chart for details).  Patient presents with complaints of possible migraine. There is similar to headaches in past. Because She has not had headaches in the last year we will obtain a CT head. We will treat with Benadryl and Reglan and ivf.  CT head unremarkable, Toradol added  Patient resting quietly. I will sign out the case to Dr. Pershing Proud. I have asked him to re-evaluate after medications are given to ensure improvement in headache. If she feels improved expect discharge  ____________________________________________   FINAL CLINICAL IMPRESSION(S) / ED DIAGNOSES  Final diagnoses:  Migraine with aura and without status migrainosus, not intractable     Jene Every, MD 10/16/15 1421

## 2015-10-16 NOTE — Discharge Instructions (Signed)

## 2015-10-16 NOTE — ED Notes (Signed)
Pt reports migraine today, reports hx of the same, states "I haven't had one in years". Pt reports pain started 1030, reports she passed out at work. Pt reports feeling lightheaded and laying in floor, denies falling or hitting head. Pt reports sensitivity to light.

## 2015-11-06 ENCOUNTER — Encounter: Payer: Self-pay | Admitting: *Deleted

## 2015-11-06 ENCOUNTER — Ambulatory Visit
Admission: EM | Admit: 2015-11-06 | Discharge: 2015-11-06 | Disposition: A | Discharge status: Home / Self Care | Payer: Self-pay | Attending: Family Medicine | Admitting: Family Medicine

## 2015-11-06 ENCOUNTER — Ambulatory Visit (INDEPENDENT_AMBULATORY_CARE_PROVIDER_SITE_OTHER): Payer: Self-pay

## 2015-11-06 DIAGNOSIS — S46911A Strain of unspecified muscle, fascia and tendon at shoulder and upper arm level, right arm, initial encounter: Secondary | ICD-10-CM

## 2015-11-06 MED ORDER — HYDROCODONE-ACETAMINOPHEN 5-325 MG PO TABS: 1.0000 | ORAL_TABLET | Freq: Four times a day (QID) | ORAL | Status: DC | PRN

## 2015-11-06 NOTE — ED Notes (Addendum)
Pt states that she was walking her dog with a leash yesterday, dog took off running which caused her to fall, hitting her face on the post of the porch and jerking her right shoulder.  Pt is having right shoulder pain.

## 2015-11-06 NOTE — ED Provider Notes (Signed)
CSN: [id number]     Arrival date & time 11/06/15  1722 History   First MD Initiated Contact with Patient 11/06/15 1839     Chief Complaint  Patient presents with  . Shoulder Pain   (Consider location/radiation/quality/duration/timing/severity/associated sxs/prior Treatment) HPI Comments: 39 yo female with a c/o right shoulder pain since yesterday, after being pulled by her dog while she was walking him, causing her to fall and land on outstretched arm.  The history is provided by the patient.    History reviewed. No pertinent past medical history. Past Surgical History  Procedure Laterality Date  . Abdominal hysterectomy    . Cardiac surgery      ablasion  . Wrist surgery     No family history on file. Social History  Substance Use Topics  . Smoking status: Never Smoker   . Smokeless tobacco: None  . Alcohol Use: No   OB History    No data available     Review of Systems  Allergies  Review of patient's allergies indicates no known allergies.  Home Medications   Prior to Admission medications   Medication Sig Start Date End Date Taking? Authorizing Provider  butalbital-acetaminophen-caffeine (FIORICET) 50-325-40 MG tablet Take 1-2 tablets by mouth every 6 (six) hours as needed for headache. 10/16/15 10/15/16  Myrna Blazeravid Matthew Schaevitz, MD  cyclobenzaprine (FLEXERIL) 10 MG tablet Take 1 tablet (10 mg total) by mouth at bedtime. 11/12/15   Payton Mccallumrlando Peng Thorstenson, MD  HYDROcodone-acetaminophen (NORCO/VICODIN) 5-325 MG tablet Take 1 tablet by mouth every 6 (six) hours as needed. 11/12/15   Payton Mccallumrlando Lynnel Zanetti, MD   Meds Ordered and Administered this Visit  Medications - No data to display  BP 101/69 mmHg  Pulse 73  Temp(Src) 97.4 F (36.3 C) (Tympanic)  Ht 5\' 4"  (1.626 m)  Wt 135 lb (61.236 kg)  BMI 23.16 kg/m2  SpO2 100% No data found.   Physical Exam  Constitutional: She appears well-developed and well-nourished. No distress.  Musculoskeletal:       Right shoulder: She  exhibits decreased range of motion, tenderness and swelling. She exhibits no bony tenderness, no effusion, no crepitus, no deformity, no laceration, no pain, no spasm, normal pulse and normal strength.  Right upper extremity neurovascularly intact  Skin: She is not diaphoretic.  Nursing note and vitals reviewed.   ED Course  Procedures (including critical care time)  Labs Review Labs Reviewed - No data to display  Imaging Review No results found.   Visual Acuity Review  Right Eye Distance:   Left Eye Distance:   Bilateral Distance:    Right Eye Near:   Left Eye Near:    Bilateral Near:         MDM   1. Shoulder strain, right, initial encounter    Discharge Medication List as of 11/06/2015  7:38 PM    START taking these medications   Details  HYDROcodone-acetaminophen (NORCO/VICODIN) 5-325 MG tablet Take 1-2 tablets by mouth every 6 (six) hours as needed., Starting 11/06/2015, Until Discontinued, Print      1. x-ray results (negative for fracture) and diagnosis reviewed with patient 2. rx as per orders above; reviewed possible side effects, interactions, risks and benefits  3. Recommend supportive treatment with rest, ice, then progressive gentle ROM exercises 4. Follow-up prn if symptoms worsen or don't improve    Payton Mccallumrlando Jaylen Claude, MD 12/06/15 [phone]

## 2015-11-06 NOTE — Discharge Instructions (Signed)
Rotator Cuff Injury °Rotator cuff injury is any type of injury to the set of muscles and tendons that make up the stabilizing unit of your shoulder. This unit holds the ball of your upper arm bone (humerus) in the socket of your shoulder blade (scapula).  °CAUSES °Injuries to your rotator cuff most commonly come from sports or activities that cause your arm to be moved repeatedly over your head. Examples of this include throwing, weight lifting, swimming, or racquet sports. Long lasting (chronic) irritation of your rotator cuff can cause soreness and swelling (inflammation), bursitis, and eventual damage to your tendons, such as a tear (rupture). °SIGNS AND SYMPTOMS °Acute rotator cuff tear: °· Sudden tearing sensation followed by severe pain shooting from your upper shoulder down your arm toward your elbow. °· Decreased range of motion of your shoulder because of pain and muscle spasm. °· Severe pain. °· Inability to raise your arm out to the side because of pain and loss of muscle power (large tears). °Chronic rotator cuff tear: °· Pain that usually is worse at night and may interfere with sleep. °· Gradual weakness and decreased shoulder motion as the pain worsens. °· Decreased range of motion. °Rotator cuff tendinitis:  °· Deep ache in your shoulder and the outside upper arm over your shoulder. °· Pain that comes on gradually and becomes worse when lifting your arm to the side or turning it inward. °DIAGNOSIS °Rotator cuff injury is diagnosed through a medical history, physical exam, and imaging exam. The medical history helps determine the type of rotator cuff injury. Your health care provider will look at your injured shoulder, feel the injured area, and ask you to move your shoulder in different positions. X-ray exams typically are done to rule out other causes of shoulder pain, such as fractures. MRI is the exam of choice for the most severe shoulder injuries because the images show muscles and tendons.    °TREATMENT  °Chronic tear: °· Medicine for pain, such as acetaminophen or ibuprofen. °· Physical therapy and range-of-motion exercises may be helpful in maintaining shoulder function and strength. °· Steroid injections into your shoulder joint. °· Surgical repair of the rotator cuff if the injury does not heal with noninvasive treatment. °Acute tear: °· Anti-inflammatory medicines such as ibuprofen and naproxen to help reduce pain and swelling. °· A sling to help support your arm and rest your rotator cuff muscles. Long-term use of a sling is not advised. It may cause significant stiffening of the shoulder joint. °· Surgery may be considered within a few weeks, especially in younger, active people, to return the shoulder to full function. °· Indications for surgical treatment include the following: °¨ Age younger than 60 years. °¨ Rotator cuff tears that are complete. °¨ Physical therapy, rest, and anti-inflammatory medicines have been used for 6-8 weeks, with no improvement. °¨ Employment or sporting activity that requires constant shoulder use. °Tendinitis: °· Anti-inflammatory medicines such as ibuprofen and naproxen to help reduce pain and swelling. °· A sling to help support your arm and rest your rotator cuff muscles. Long-term use of a sling is not advised. It may cause significant stiffening of the shoulder joint. °· Severe tendinitis may require: °¨ Steroid injections into your shoulder joint. °¨ Physical therapy. °¨ Surgery. °HOME CARE INSTRUCTIONS  °· Apply ice to your injury: °¨ Put ice in a plastic bag. °¨ Place a towel between your skin and the bag. °¨ Leave the ice on for 20 minutes, 2-3 times a day. °· If you   have a shoulder immobilizer (sling and straps), wear it until told otherwise by your health care provider. °· You may want to sleep on several pillows or in a recliner at night to lessen swelling and pain. °· Only take over-the-counter or prescription medicines for pain, discomfort, or fever as  directed by your health care provider. °· Do simple hand squeezing exercises with a soft rubber ball to decrease hand swelling. °SEEK MEDICAL CARE IF:  °· Your shoulder pain increases, or new pain or numbness develops in your arm, hand, or fingers. °· Your hand or fingers are colder than your other hand. °SEEK IMMEDIATE MEDICAL CARE IF:  °· Your arm, hand, or fingers are numb or tingling. °· Your arm, hand, or fingers are increasingly swollen and painful, or they turn white or blue. °MAKE SURE YOU: °· Understand these instructions. °· Will watch your condition. °· Will get help right away if you are not doing well or get worse. °  °This information is not intended to replace advice given to you by your health care provider. Make sure you discuss any questions you have with your health care provider. °  °Document Released: 11/28/2000 Document Revised: 12/06/2013 Document Reviewed: 07/13/2013 °[employer] Interactive Patient Education ©2016 [employer] ° °

## 2015-11-12 ENCOUNTER — Ambulatory Visit
Admission: EM | Admit: 2015-11-12 | Discharge: 2015-11-12 | Disposition: A | Discharge status: Home / Self Care | Payer: Self-pay | Attending: Family Medicine | Admitting: Family Medicine

## 2015-11-12 DIAGNOSIS — S46911D Strain of unspecified muscle, fascia and tendon at shoulder and upper arm level, right arm, subsequent encounter: Secondary | ICD-10-CM

## 2015-11-12 DIAGNOSIS — S29012A Strain of muscle and tendon of back wall of thorax, initial encounter: Secondary | ICD-10-CM

## 2015-11-12 MED ORDER — HYDROCODONE-ACETAMINOPHEN 5-325 MG PO TABS: 1.0000 | ORAL_TABLET | Freq: Four times a day (QID) | ORAL | Status: DC | PRN

## 2015-11-12 MED ORDER — CYCLOBENZAPRINE HCL 10 MG PO TABS: 10.0000 mg | ORAL_TABLET | Freq: Every day | ORAL | Status: DC

## 2015-11-12 NOTE — ED Notes (Signed)
Recheck from 11/06/2015. Pt was diagnosed with shoulder strain after walking dog. Xray was negative. Pt reports the pain is improved, but not to goal.

## 2015-11-15 NOTE — ED Provider Notes (Signed)
CSN: [id number]     Arrival date & time 11/12/15  1711 History   First MD Initiated Contact with Patient 11/12/15 1745     Chief Complaint  Patient presents with  . Shoulder Pain   (Consider location/radiation/quality/duration/timing/severity/associated sxs/prior Treatment) HPI Comments: 39 yo female seen here last week with a right shoulder injury (strain) after getting pulled while walking her dog. States improving but not completely back to normal.   Patient is a 39 y.o. female presenting with shoulder pain. The history is provided by the patient.  Shoulder Pain Location:  Shoulder Shoulder location:  R shoulder Pain details:    Quality:  Aching   No past medical history on file. Past Surgical History  Procedure Laterality Date  . Abdominal hysterectomy    . Cardiac surgery      ablasion  . Wrist surgery     No family history on file. Social History  Substance Use Topics  . Smoking status: Never Smoker   . Smokeless tobacco: None  . Alcohol Use: No   OB History    No data available     Review of Systems  Allergies  Review of patient's allergies indicates no known allergies.  Home Medications   Prior to Admission medications   Medication Sig Start Date End Date Taking? Authorizing Provider  butalbital-acetaminophen-caffeine (FIORICET) 50-325-40 MG tablet Take 1-2 tablets by mouth every 6 (six) hours as needed for headache. 10/16/15 10/15/16 Yes Myrna Blazeravid Matthew Schaevitz, MD  cyclobenzaprine (FLEXERIL) 10 MG tablet Take 1 tablet (10 mg total) by mouth at bedtime. 11/12/15   Payton Mccallumrlando Idan Prime, MD  HYDROcodone-acetaminophen (NORCO/VICODIN) 5-325 MG tablet Take 1 tablet by mouth every 6 (six) hours as needed. 11/12/15   Payton Mccallumrlando Bassam Dresch, MD   Meds Ordered and Administered this Visit  Medications - No data to display  BP 124/64 mmHg  Pulse 80  Temp(Src) 98.1 F (36.7 C)  Resp 16  Ht 5\' 3"  (1.6 m)  Wt 130 lb (58.968 kg)  BMI 23.03 kg/m2  SpO2 100% No data  found.   Physical Exam  Constitutional: She appears well-developed and well-nourished. No distress.  Musculoskeletal:       Right shoulder: She exhibits decreased range of motion (with abduction, however improved from one week ago), tenderness (over trapezius and deltoid muscle) and spasm (to right trapezius). She exhibits no bony tenderness, no swelling, no effusion, no crepitus, no deformity, no laceration, no pain, normal pulse and normal strength.  Skin: She is not diaphoretic.  Nursing note and vitals reviewed.   ED Course  Procedures (including critical care time)  Labs Review Labs Reviewed - No data to display  Imaging Review No results found.   Visual Acuity Review  Right Eye Distance:   Left Eye Distance:   Bilateral Distance:    Right Eye Near:   Left Eye Near:    Bilateral Near:         MDM   1. Shoulder strain, right, subsequent encounter   2. Upper back strain, initial encounter    Discharge Medication List as of 11/12/2015  7:01 PM    START taking these medications   Details  cyclobenzaprine (FLEXERIL) 10 MG tablet Take 1 tablet (10 mg total) by mouth at bedtime., Starting 11/12/2015, Until Discontinued, Normal       1. diagnosis reviewed with patient; slowly improving since seen last week 2. rx as per orders above; reviewed possible side effects, interactions, risks and benefits  3. Recommend supportive treatment with otc  analgesics, gentle ROM exercises, heat/ice 4. Follow-up prn if symptoms worsen or don't improve; also discussed with patient that she may need to follow up with PCP or orthopedist    Payton Mccallum, MD 12/06/15 [phone]

## 2016-03-24 ENCOUNTER — Ambulatory Visit (INDEPENDENT_AMBULATORY_CARE_PROVIDER_SITE_OTHER): Payer: BLUE CROSS/BLUE SHIELD

## 2016-03-24 ENCOUNTER — Encounter: Payer: Self-pay | Admitting: *Deleted

## 2016-03-24 ENCOUNTER — Ambulatory Visit
Admission: EM | Admit: 2016-03-24 | Discharge: 2016-03-24 | Disposition: A | Discharge status: Home / Self Care | Payer: BLUE CROSS/BLUE SHIELD | Attending: Family Medicine | Admitting: Family Medicine

## 2016-03-24 DIAGNOSIS — M461 Sacroiliitis, not elsewhere classified: Secondary | ICD-10-CM

## 2016-03-24 DIAGNOSIS — M5442 Lumbago with sciatica, left side: Secondary | ICD-10-CM | POA: Diagnosis not present

## 2016-03-24 DIAGNOSIS — M62830 Muscle spasm of back: Secondary | ICD-10-CM | POA: Diagnosis not present

## 2016-03-24 IMAGING — CR DG LUMBAR SPINE COMPLETE 4+V
6 series · 6 of 6 positions shown · non-contrast
Comparison: None.

CLINICAL DATA: Low back pain radiating into left buttock.

EXAM:
LUMBAR SPINE - COMPLETE 4+ VIEW

[l-spine ap]
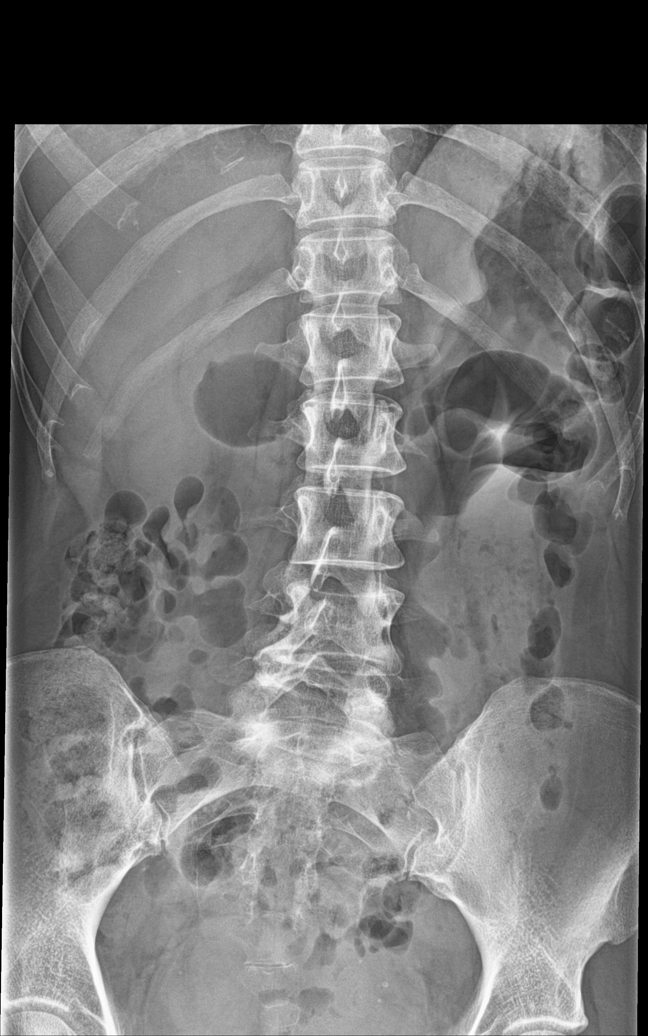

[l-spine obl (1 of 2)]
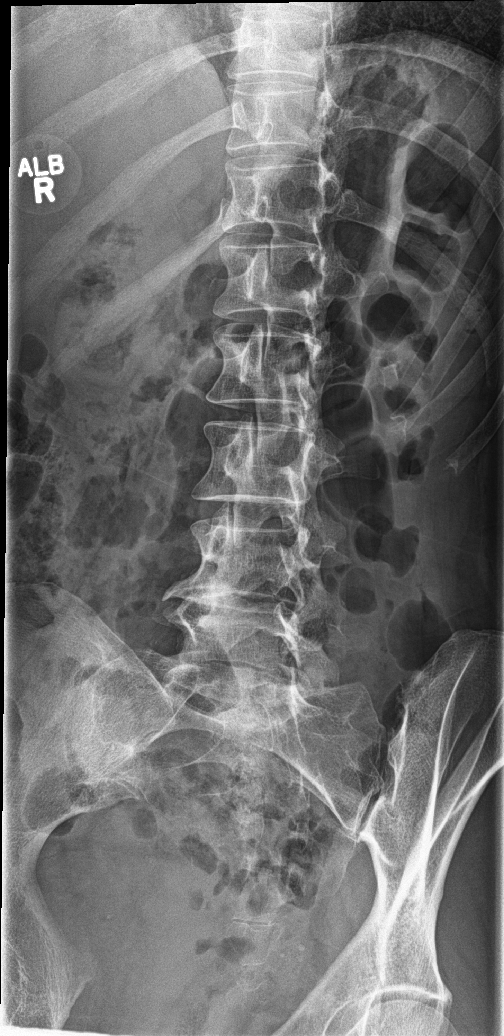

[l-spine obl (2 of 2)]
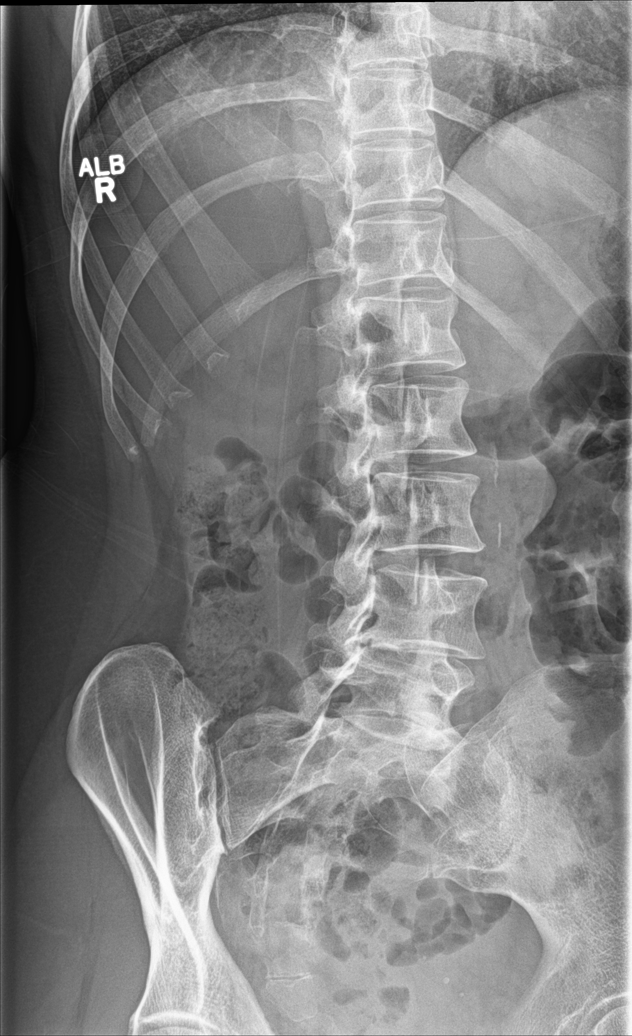

[l-spine lat]
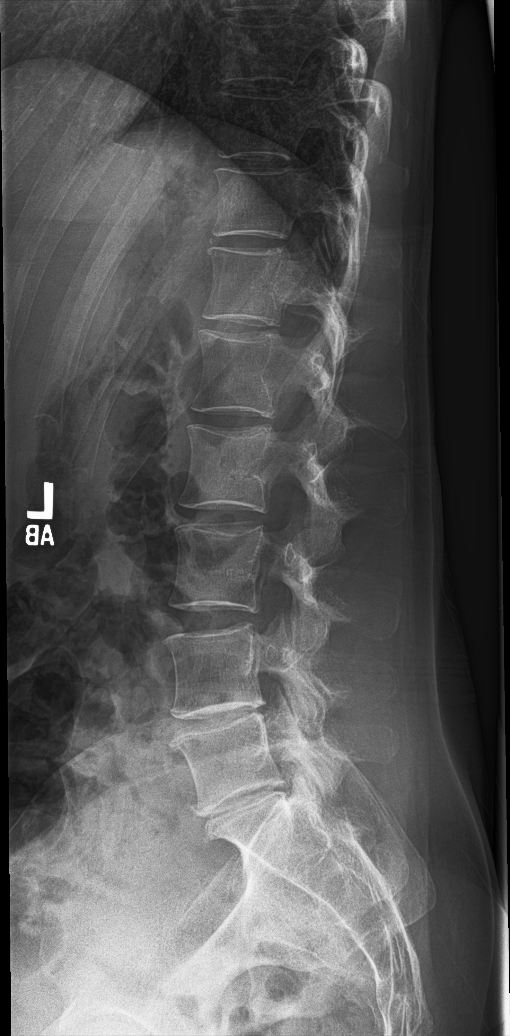

[l-spine spot (1 of 2)]
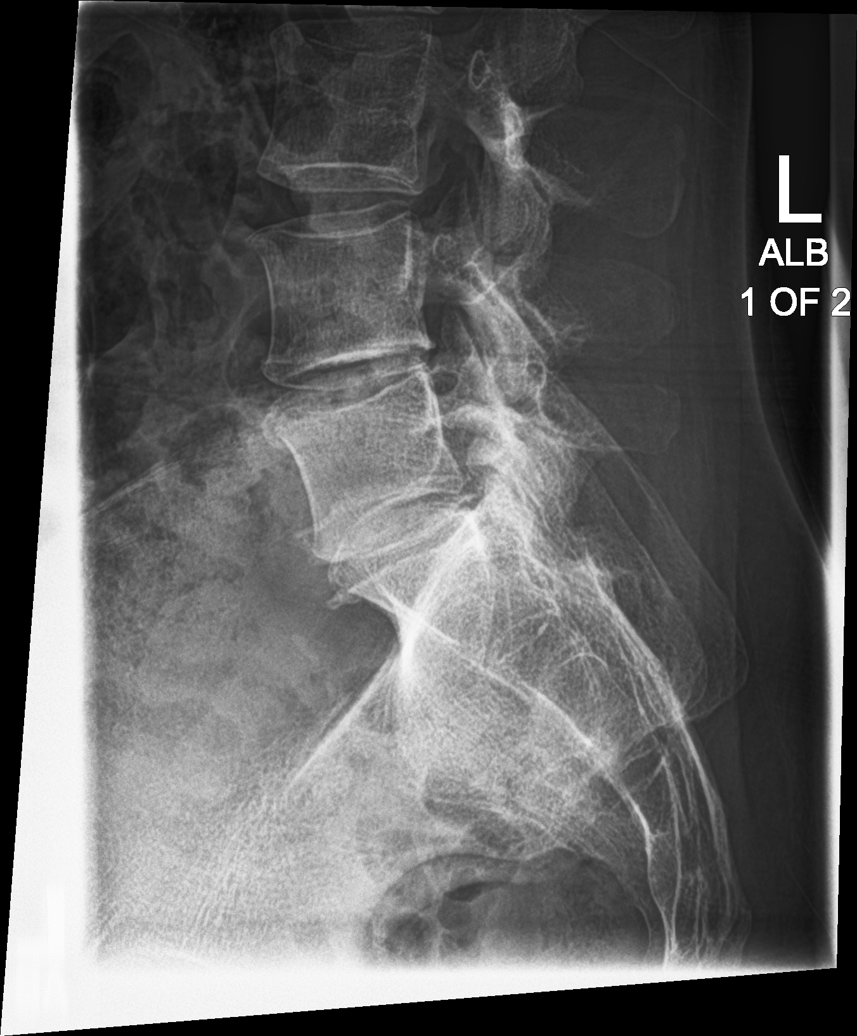

[l-spine spot (2 of 2)]
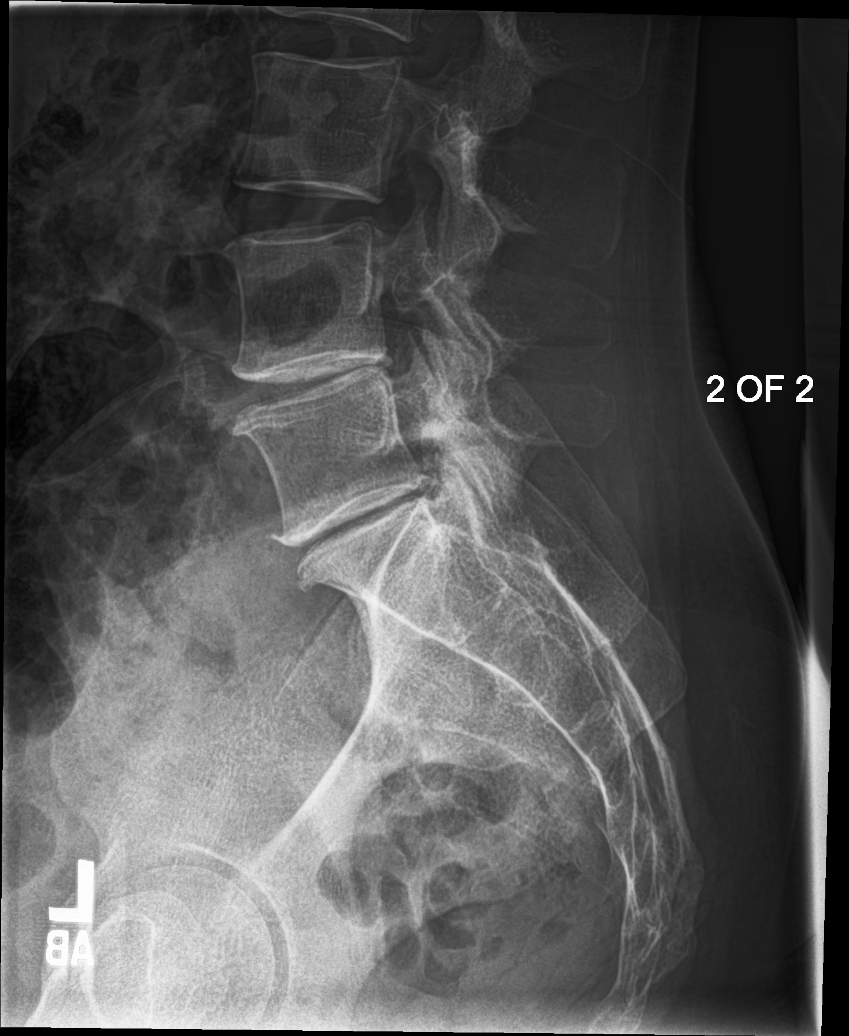

[6 of 6 positions shown; findings below may reference images not displayed]

FINDINGS: No evidence of fracture or subluxation. Spondylosis present at L4-5
and L5-S1 consisting of mild disc space narrowing as well as facet
hypertrophy. No bony lesions or destruction identified.
IMPRESSION: Lumbar spondylosis at L4-5 and L5-S1.

## 2016-03-24 IMAGING — CR DG SI JOINTS 3+V
3 series · 3 of 3 positions shown · non-contrast
Comparison: None.

CLINICAL DATA: Low back pain.

EXAM:
BILATERAL SACROILIAC JOINTS - 3+ VIEW

[si joints ap]
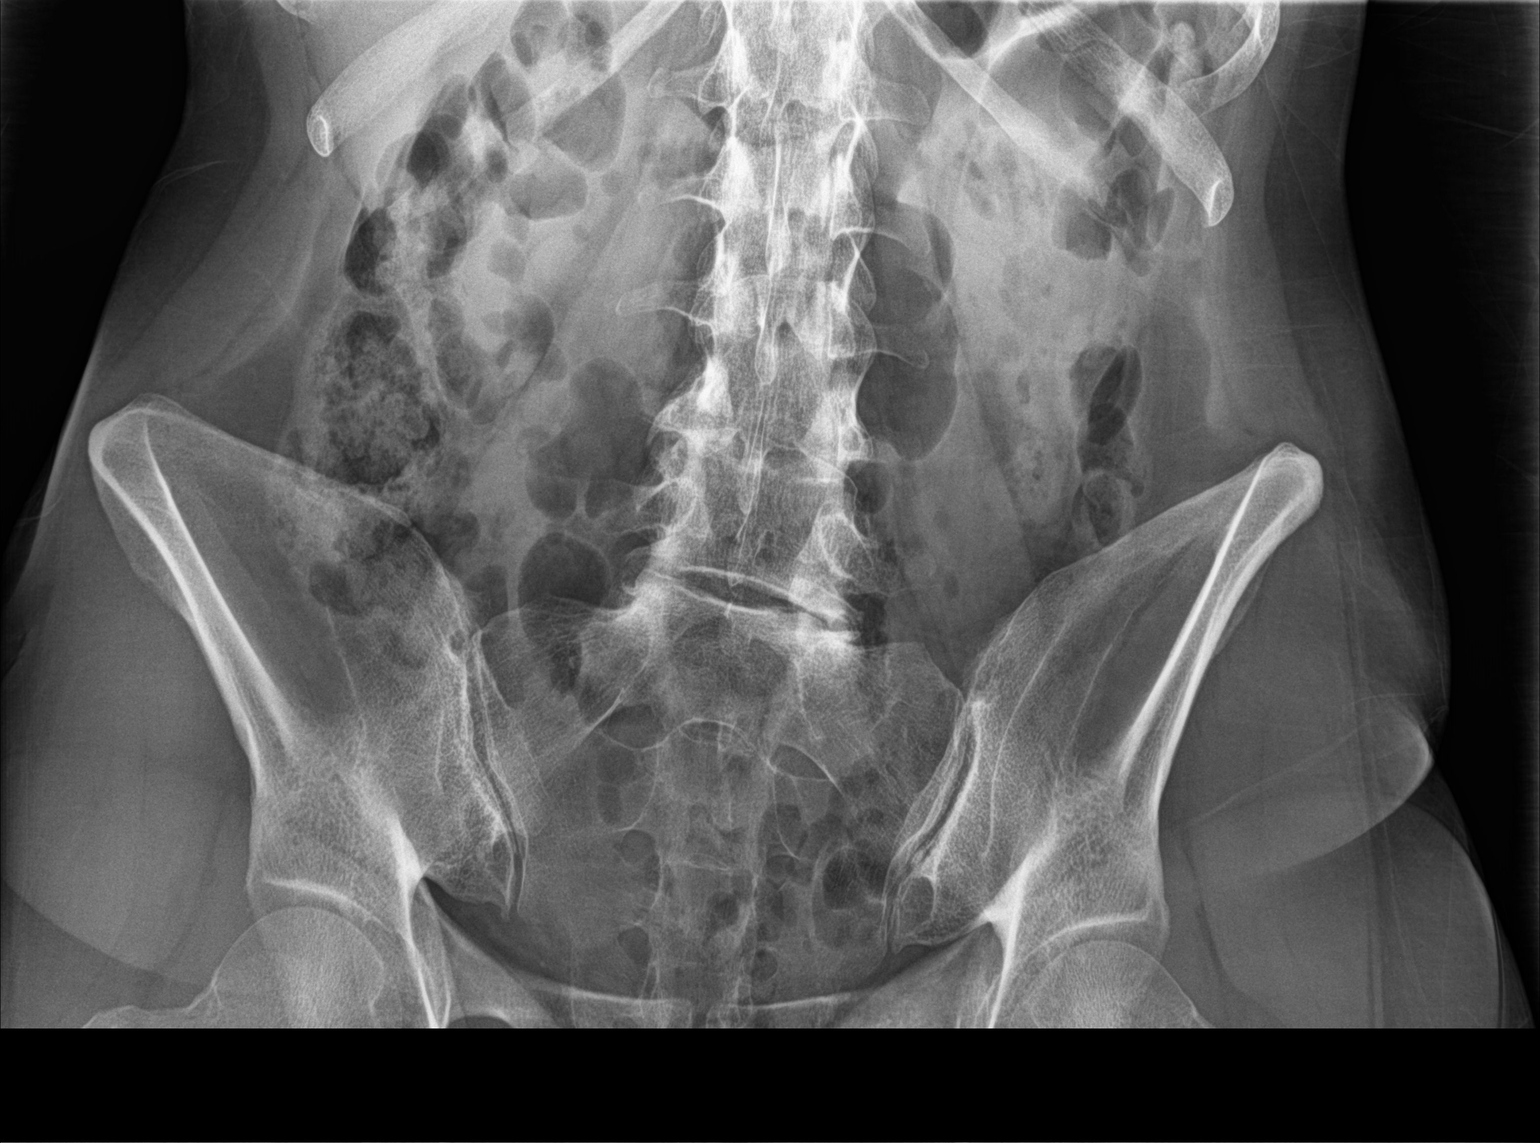

[si joints obl (1 of 2)]
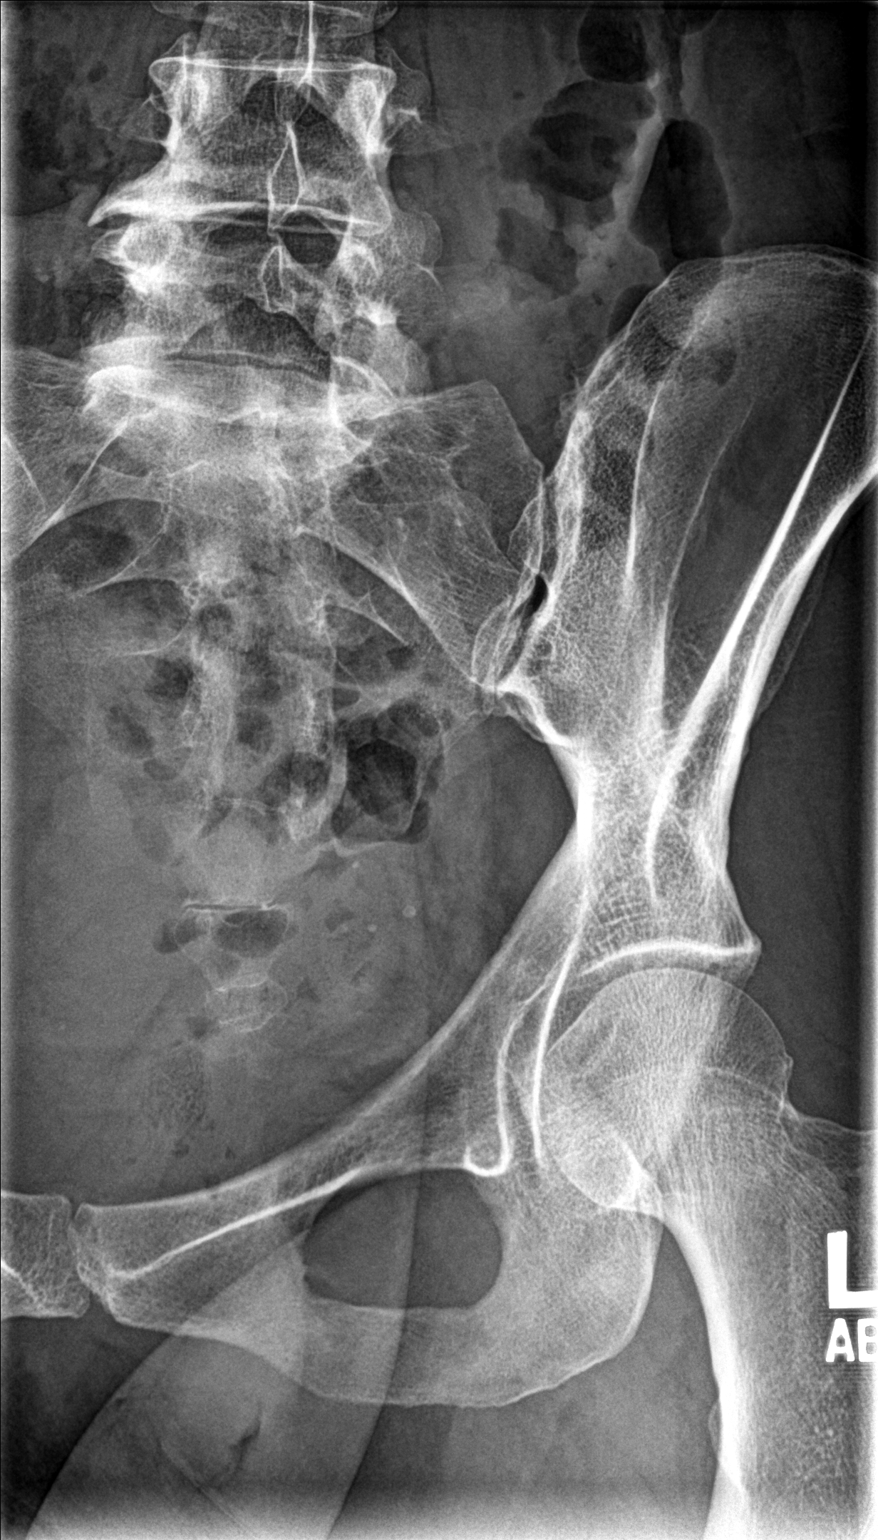

[si joints obl (2 of 2)]
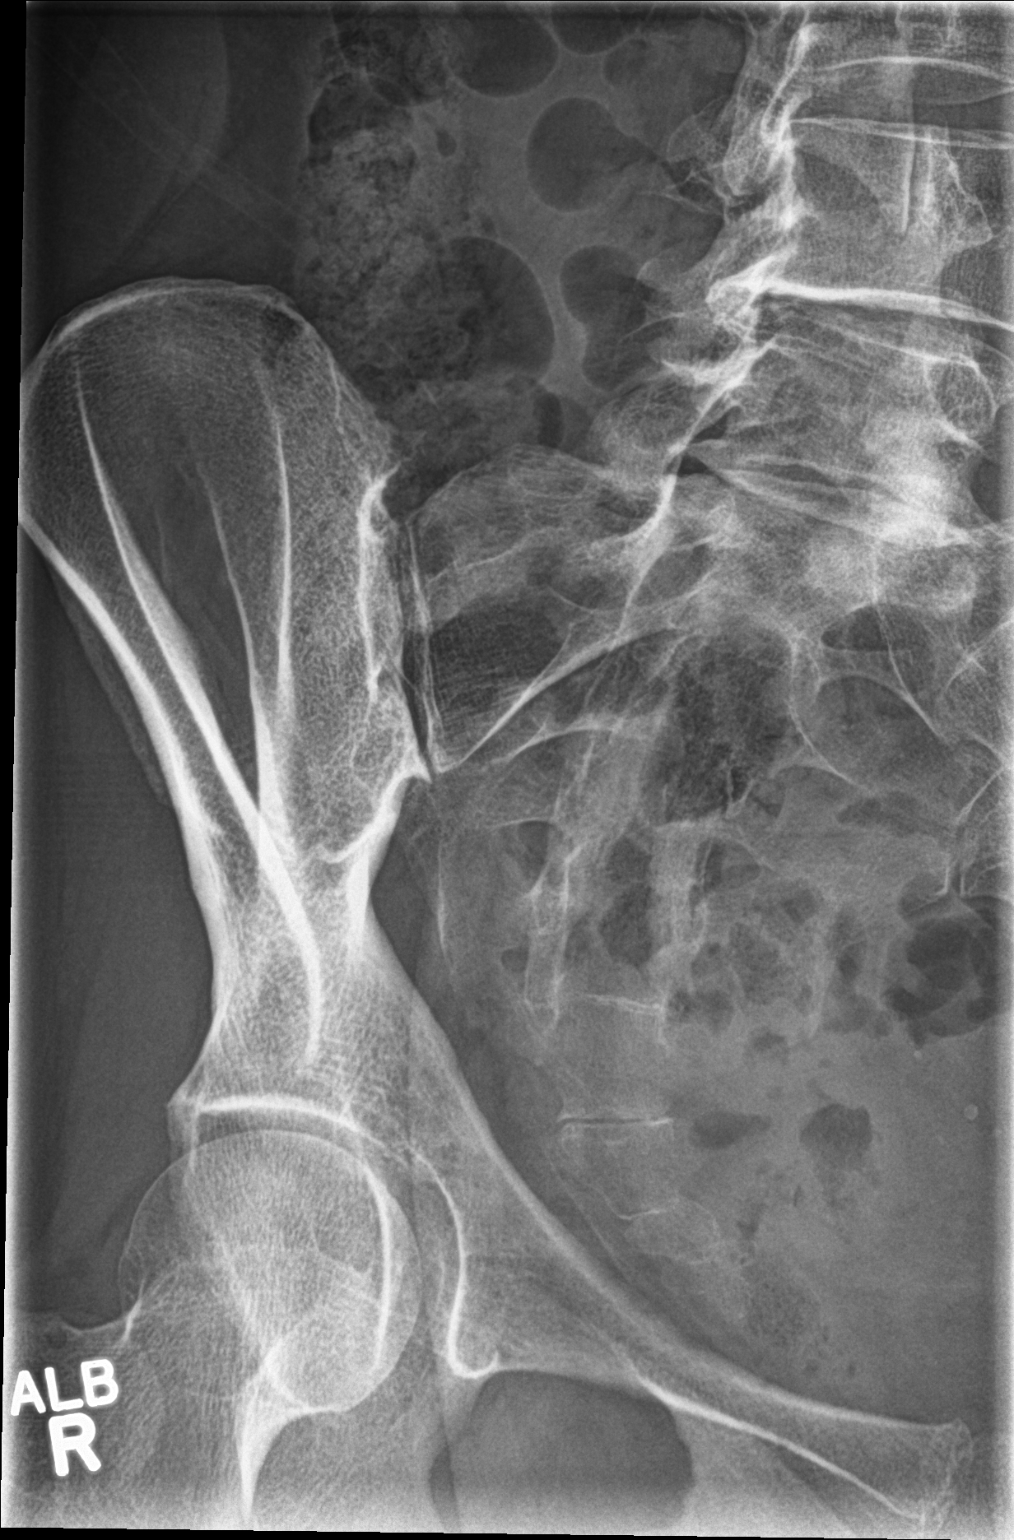

[3 of 3 positions shown; findings below may reference images not displayed]

FINDINGS: The sacroiliac joints are symmetric. Minimal degenerative changes
are seen involving inferior SI joints bilaterally. No erosions or
bony destruction. No focal bony lesions are seen.
IMPRESSION: No fracture identified. Minimal degenerative changes of both SI
joints.

## 2016-03-24 MED ORDER — MELOXICAM 15 MG PO TABS: 15.0000 mg | ORAL_TABLET | Freq: Every day | ORAL | Status: DC

## 2016-03-24 MED ORDER — PREDNISONE 10 MG (21) PO TBPK: ORAL_TABLET | ORAL | Status: DC

## 2016-03-24 MED ORDER — KETOROLAC TROMETHAMINE 60 MG/2ML IM SOLN
60.0000 mg | Freq: Once | INTRAMUSCULAR | Status: AC
  Administered 2016-03-24: 60 mg via INTRAMUSCULAR

## 2016-03-24 MED ORDER — ORPHENADRINE CITRATE ER 100 MG PO TB12: 100.0000 mg | ORAL_TABLET | Freq: Two times a day (BID) | ORAL | Status: DC

## 2016-03-24 MED ORDER — HYDROCODONE-ACETAMINOPHEN 5-325 MG PO TABS: 1.0000 | ORAL_TABLET | Freq: Three times a day (TID) | ORAL | Status: DC | PRN

## 2016-03-24 NOTE — ED Notes (Signed)
Left hip and back pain onset yesterday, awoke with pain yesterday and stayed home from work and partially resolved. Went back to work today and pain is much worse. Chronic back pain from disc disease.

## 2016-03-24 NOTE — ED Provider Notes (Signed)
CSN: [id number]     Arrival date & time 03/24/16  1305 History   First MD Initiated Contact with Patient 03/24/16 1629    Nurses notes were reviewed.  Chief Complaint  Patient presents with  . Back Pain  . Hip Pain   Patient reports having back pain started yesterday. She's had history of back pain before but nothing recently and she denies any recent injury to her back. She states by last night which went to bed she was having excruciating pain. She will work today but I 12:00 actively work because of the pain was so bad. She reports difficult to sitting down. She states that she is was told years ago she had a bad disc in her back but she never had surgery.  Past history she is former smoker she's had a cardiac ablation she does not have a PCP and does not or has not been seen by PCP in years. She has had abdominal hysterectomy. She denies any pertinent medical findings with her condition.    (Consider location/radiation/quality/duration/timing/severity/associated sxs/prior Treatment) Patient is a 40 y.o. female presenting with back pain and hip pain. The history is provided by the patient. No language interpreter was used.  Back Pain Location:  Sacro-iliac joint and lumbar spine Quality:  Aching and stabbing Radiates to:  L posterior upper leg, L thigh, L knee and L foot Pain severity:  Severe Pain is:  Same all the time Onset quality:  Sudden Duration:  2 days Timing:  Constant Progression:  Worsening Chronicity:  Recurrent Context: not emotional stress, not falling, not jumping from heights, not lifting heavy objects, not MCA, not MVA, not occupational injury, not pedestrian accident, not physical stress, not recent illness, not recent injury and not twisting   Relieved by:  Nothing Worsened by:  Bending and movement Ineffective treatments:  None tried Associated symptoms: numbness, paresthesias and weakness   Hip Pain    History reviewed. No pertinent past medical  history. Past Surgical History  Procedure Laterality Date  . Abdominal hysterectomy    . Cardiac surgery      ablasion  . Wrist surgery    . Cardiac electrophysiology mapping and ablation     History reviewed. No pertinent family history. Social History  Substance Use Topics  . Smoking status: Former Games developermoker  . Smokeless tobacco: None  . Alcohol Use: No   OB History    No data available     Review of Systems  Musculoskeletal: Positive for back pain.  Neurological: Positive for weakness, numbness and paresthesias.  All other systems reviewed and are negative.   Allergies  Review of patient's allergies indicates no known allergies.  Home Medications   Prior to Admission medications   Medication Sig Start Date End Date Taking? Authorizing Provider  butalbital-acetaminophen-caffeine (FIORICET) 50-325-40 MG tablet Take 1-2 tablets by mouth every 6 (six) hours as needed for headache. 10/16/15 10/15/16  Myrna Blazeravid Matthew Schaevitz, MD  cyclobenzaprine (FLEXERIL) 10 MG tablet Take 1 tablet (10 mg total) by mouth at bedtime. 11/12/15   Payton Mccallumrlando Conty, MD  HYDROcodone-acetaminophen (NORCO) 5-325 MG tablet Take 1 tablet by mouth every 8 (eight) hours as needed for moderate pain. 03/24/16   Hassan RowanEugene Amesha Bailey, MD  HYDROcodone-acetaminophen (NORCO/VICODIN) 5-325 MG tablet Take 1 tablet by mouth every 6 (six) hours as needed. 11/12/15   Payton Mccallumrlando Conty, MD  meloxicam (MOBIC) 15 MG tablet Take 1 tablet (15 mg total) by mouth daily. 03/24/16   Hassan RowanEugene Katleen Carraway, MD  orphenadrine (NORFLEX) 100  MG tablet Take 1 tablet (100 mg total) by mouth 2 (two) times daily. 03/24/16   Hassan Rowan, MD  predniSONE (STERAPRED UNI-PAK 21 TAB) 10 MG (21) TBPK tablet Sig 6 tablet day 1, 5 tablets day 2, 4 tablets day 3,,3tablets day 4, 2 tablets day 5, 1 tablet day 6 take all tablets orally 03/24/16   Hassan Rowan, MD   Meds Ordered and Administered this Visit   Medications  ketorolac (TORADOL) injection 60 mg (60 mg Intramuscular  Given 03/24/16 1701)    BP 117/90 mmHg  Pulse 72  Temp(Src) 97.9 F (36.6 C) (Oral)  Resp 16  Ht  (1.626 m)  Wt 125 lb (56.7 kg)  BMI 21.45 kg/m2  SpO2 100% No data found.   Physical Exam  Constitutional: She is oriented to person, place, and time. She appears well-developed and well-nourished.  HENT:  Head: Normocephalic and atraumatic.  Eyes: Conjunctivae are normal. Pupils are equal, round, and reactive to light.  Neck: Normal range of motion.  Musculoskeletal: She exhibits tenderness.       Lumbar back: She exhibits tenderness, pain and spasm.       Back:  Patient has tenderness over the lumbar spine but on the left iliac sacral crest joint area last for most the pain is present and reproduces the pain goes down her left leg as well.  Neurological: She is alert and oriented to person, place, and time.  Skin: Skin is warm. No rash noted. No erythema.  Psychiatric: She has a normal mood and affect.  Vitals reviewed.   ED Course  Procedures (including critical care time)  Labs Review Labs Reviewed - No data to display  Imaging Review Dg Lumbar Spine Complete  03/24/2016  CLINICAL DATA:  Low back pain radiating into left buttock. EXAM: LUMBAR SPINE - COMPLETE 4+ VIEW COMPARISON:  None. FINDINGS: No evidence of fracture or subluxation. Spondylosis present at L4-5 and L5-S1 consisting of mild disc space narrowing as well as facet hypertrophy. No bony lesions or destruction identified. IMPRESSION: Lumbar spondylosis at L4-5 and L5-S1. Electronically Signed   By: Irish Lack M.D.   On: 03/24/2016 18:00   Dg Si Joints  03/24/2016  CLINICAL DATA:  Low back pain. EXAM: BILATERAL SACROILIAC JOINTS - 3+ VIEW COMPARISON:  None. FINDINGS: The sacroiliac joints are symmetric. Minimal degenerative changes are seen involving inferior SI joints bilaterally. No erosions or bony destruction. No focal bony lesions are seen. IMPRESSION: No fracture identified. Minimal degenerative  changes of both SI joints. Electronically Signed   By: Irish Lack M.D.   On: 03/24/2016 17:59     Visual Acuity Review  Right Eye Distance:   Left Eye Distance:   Bilateral Distance:    Right Eye Near:   Left Eye Near:    Bilateral Near:         MDM   1. Left-sided low back pain with left-sided sciatica   2. Sacroiliitis (HCC)   3. Muscle spasm of back    Patient was given 60 mg Toradol IM for the back pain. X-ray shows arthritis degenerative changes. Degenerative changes of both SI joints. With that in mind placing a 6 day course of prednisone we have some Vicodin for extreme pain which he is on when necessary basis and mobile 50 mg and Norflex twice a day. Will keep rales OR can't until Wednesday of this week and thus no for Monday and Tuesday. She normally is established up the PCP as needed for  PCP for long-term care therapy closer not going able to provide FMLA since were not following her for this problem long-term.    Note: This dictation was prepared with Dragon dictation along with smaller phrase technology. Any transcriptional errors that result from this process are unintentional.  Hassan Rowan, MD 03/24/16 [phone]

## 2016-03-24 NOTE — Discharge Instructions (Signed)
Muscle Cramps and Spasms Muscle cramps and spasms are when muscles tighten by themselves. They usually get better within minutes. Muscle cramps are painful. They are usually stronger and last longer than muscle spasms. Muscle spasms may or may not be painful. They can last a few seconds or much longer. HOME CARE  Drink enough fluid to keep your pee (urine) clear or pale yellow.  Massage, stretch, and relax the muscle.  Use a warm towel, heating pad, or warm shower water on tight muscles.  Place ice on the muscle if it is tender or in pain.  Put ice in a plastic bag.  Place a towel between your skin and the bag.  Leave the ice on for 15-20 minutes, 03-04 times a day.  Only take medicine as told by your doctor. GET HELP RIGHT AWAY IF:  Your cramps or spasms get worse, happen more often, or do not get better with time. MAKE SURE YOU:  Understand these instructions.  Will watch your condition.  Will get help right away if you are not doing well or get worse.   This information is not intended to replace advice given to you by your health care provider. Make sure you discuss any questions you have with your health care provider.   Document Released: 11/13/2008 Document Revised: 03/28/2013 Document Reviewed: 11/17/2012 Elsevier Interactive Patient Education 2016 [employer]  Sciatica Sciatica is pain, weakness, numbness, or tingling along your sciatic nerve. The nerve starts in the lower back and runs down the back of each leg. Nerve damage or certain conditions pinch or put pressure on the sciatic nerve. This causes the pain, weakness, and other discomforts of sciatica. HOME CARE   Only take medicine as told by your doctor.  Apply ice to the affected area for 20 minutes. Do this 3-4 times a day for the first 48-72 hours. Then try heat in the same way.  Exercise, stretch, or do your usual activities if these do not make your pain worse.  Go to physical therapy as told by your  doctor.  Keep all doctor visits as told.  Do not wear high heels or shoes that are not supportive.  Get a firm mattress if your mattress is too soft to lessen pain and discomfort. GET HELP RIGHT AWAY IF:   You cannot control when you poop (bowel movement) or pee (urinate).  You have more weakness in your lower back, lower belly (pelvis), butt (buttocks), or legs.  You have redness or puffiness (swelling) of your back.  You have a burning feeling when you pee.  You have pain that gets worse when you lie down.  You have pain that wakes you from your sleep.  Your pain is worse than past pain.  Your pain lasts longer than 4 weeks.  You are suddenly losing weight without reason. MAKE SURE YOU:   Understand these instructions.  Will watch this condition.  Will get help right away if you are not doing well or get worse.   This information is not intended to replace advice given to you by your health care provider. Make sure you discuss any questions you have with your health care provider.   Document Released: 09/09/2008 Document Revised: 08/22/2015 Document Reviewed: 04/11/2012 Elsevier Interactive Patient Education 2016 [employer] Inc[employer]  Chronic Back Pain  When back pain lasts longer than 3 months, it is called chronic back pain.People with chronic back pain often go through certain periods that are more intense (flare-ups).  CAUSES Chronic  back pain can be caused by wear and tear (degeneration) on different structures in your back. These structures include:  The bones of your spine (vertebrae) and the joints surrounding your spinal cord and nerve roots (facets).  The strong, fibrous tissues that connect your vertebrae (ligaments). Degeneration of these structures may result in pressure on your nerves. This can lead to constant pain. HOME CARE INSTRUCTIONS  Avoid bending, heavy lifting, prolonged sitting, and activities which make the problem worse.  Take brief periods  of rest throughout the day to reduce your pain. Lying down or standing usually is better than sitting while you are resting.  Take over-the-counter or prescription medicines only as directed by your caregiver. SEEK IMMEDIATE MEDICAL CARE IF:   You have weakness or numbness in one of your legs or feet.  You have trouble controlling your bladder or bowels.  You have nausea, vomiting, abdominal pain, shortness of breath, or fainting.   This information is not intended to replace advice given to you by your health care provider. Make sure you discuss any questions you have with your health care provider.   Document Released: 01/08/2005 Document Revised: 02/23/2012 Document Reviewed: 05/21/2015 Elsevier Interactive Patient Education [employer]

## 2016-03-28 ENCOUNTER — Other Ambulatory Visit: Payer: Self-pay | Admitting: Family Medicine

## 2016-03-28 DIAGNOSIS — Z1231 Encounter for screening mammogram for malignant neoplasm of breast: Secondary | ICD-10-CM

## 2016-03-31 ENCOUNTER — Ambulatory Visit
Admission: RE | Admit: 2016-03-31 | Discharge: 2016-03-31 | Disposition: A | Discharge status: Home / Self Care | Payer: BLUE CROSS/BLUE SHIELD | Source: Ambulatory Visit | Attending: Family Medicine | Admitting: Family Medicine

## 2016-03-31 DIAGNOSIS — R928 Other abnormal and inconclusive findings on diagnostic imaging of breast: Secondary | ICD-10-CM | POA: Insufficient documentation

## 2016-03-31 DIAGNOSIS — Z1231 Encounter for screening mammogram for malignant neoplasm of breast: Secondary | ICD-10-CM | POA: Diagnosis not present

## 2016-03-31 IMAGING — MG MM DIGITAL SCREENING BILAT W/ CAD
4 series · 8 of 8 positions shown · non-contrast
Comparison: None.

CLINICAL DATA: Screening.

EXAM:
DIGITAL SCREENING BILATERAL MAMMOGRAM WITH CAD

[R CC · right · 5 of 5 slices shown (1 of 2)]
[im 1/5]
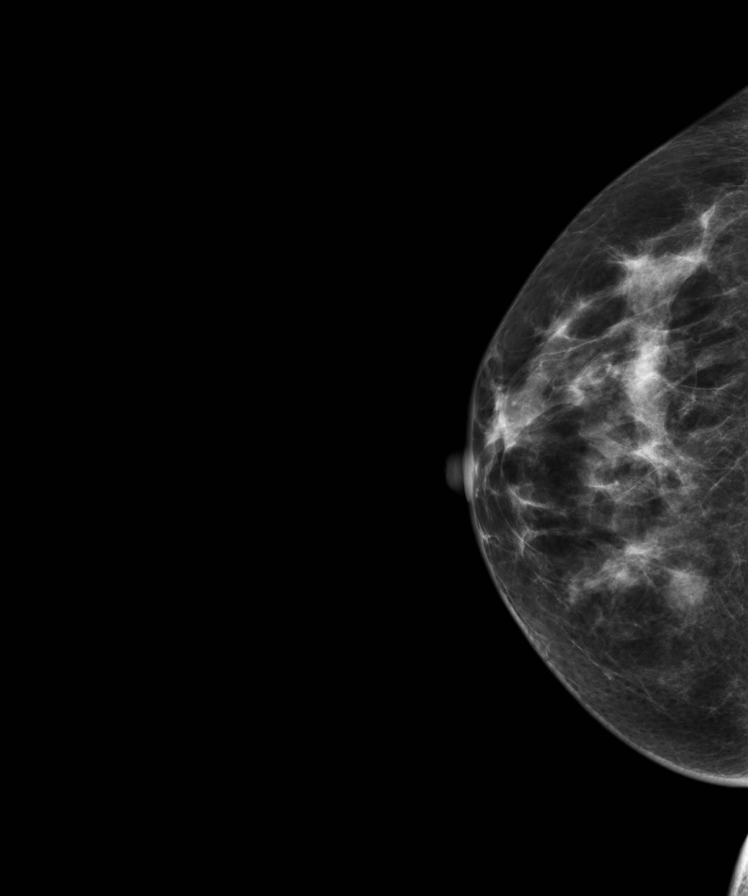
[im 2/5]
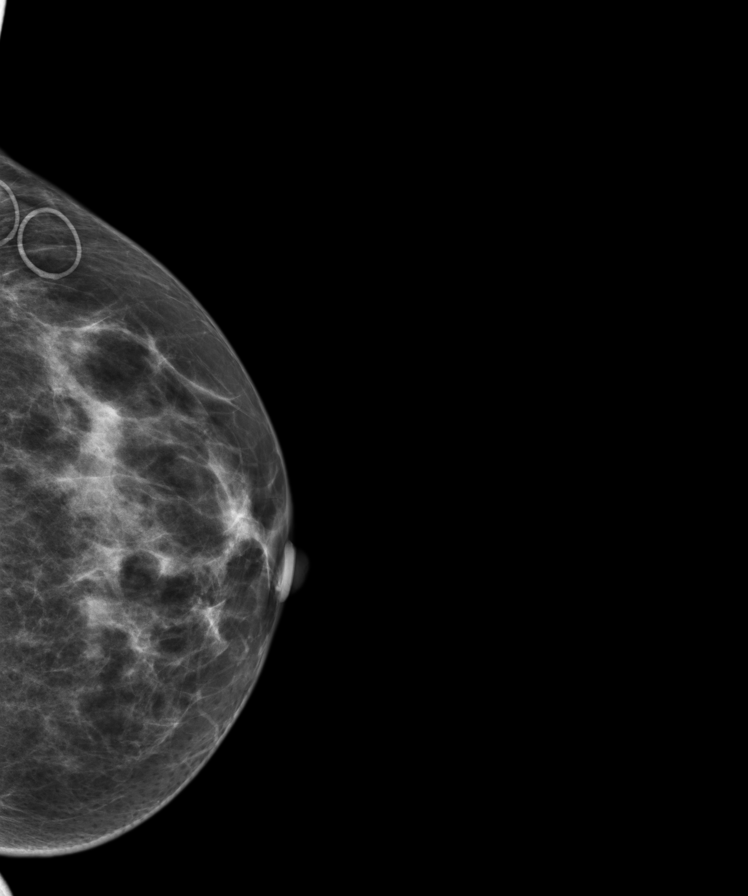
[im 3/5]
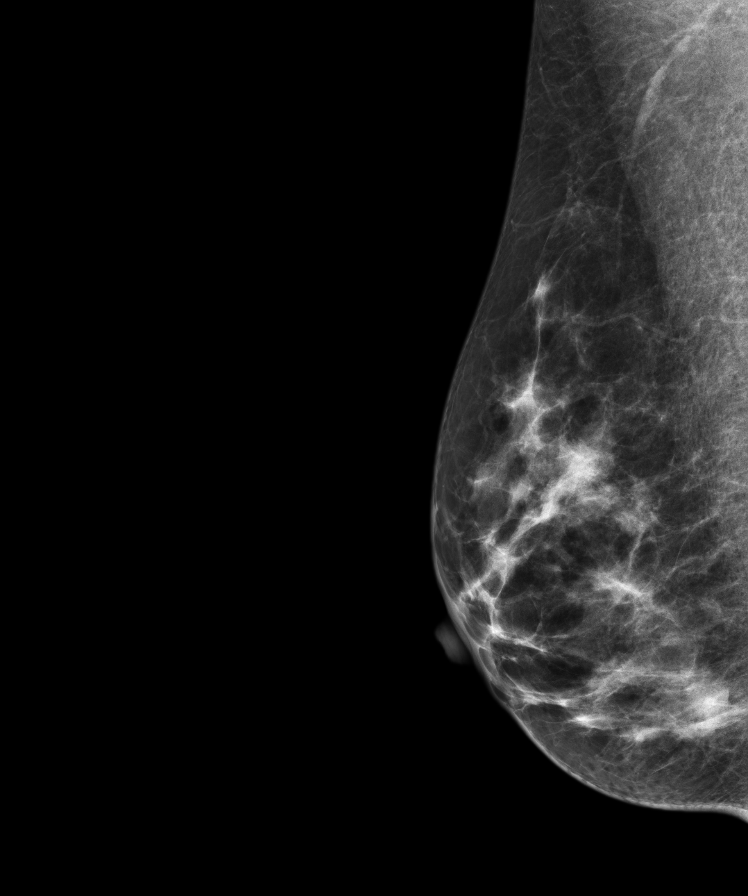
[im 4/5]
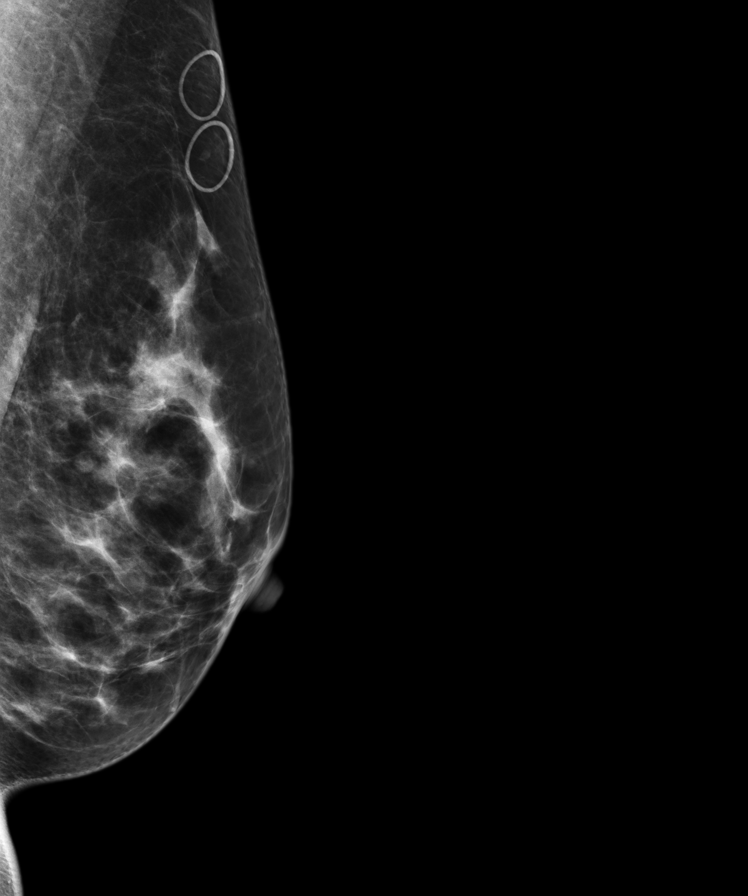
[im 5/5]
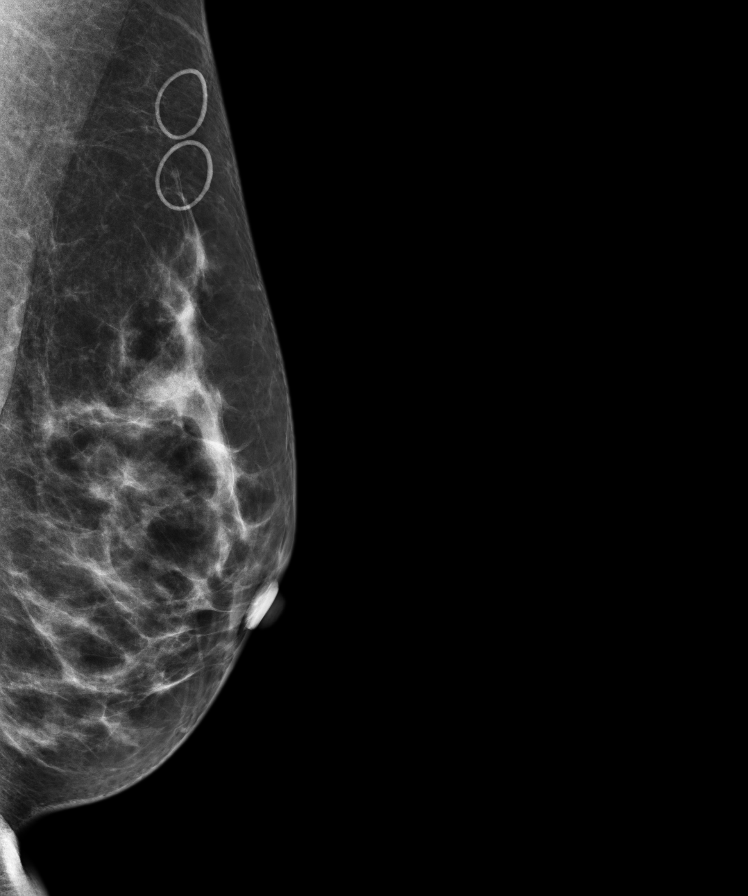

[R MLO]
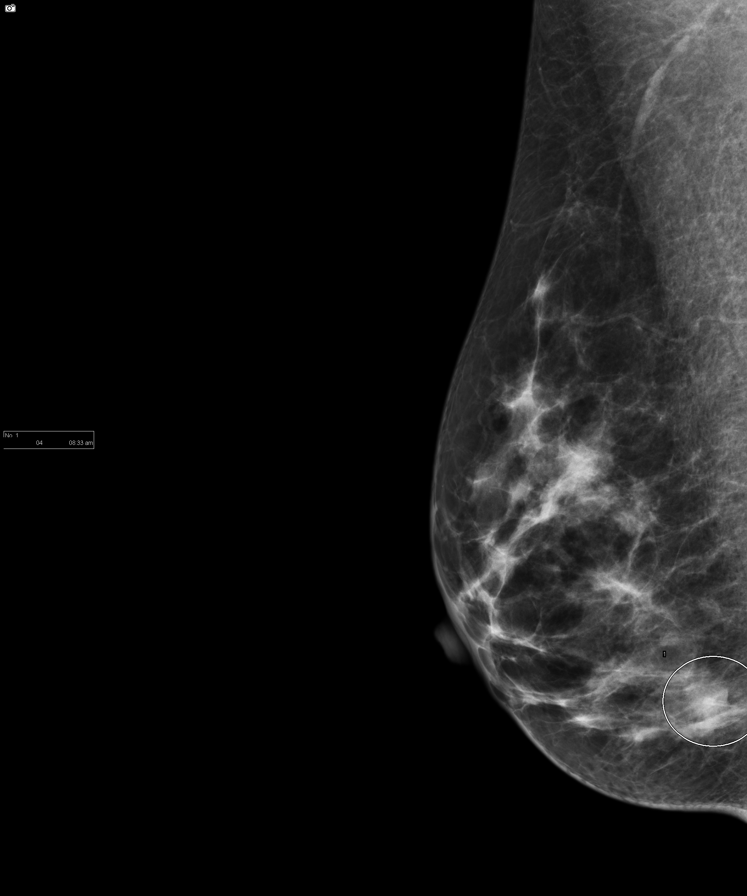

[R CC (2 of 2)]
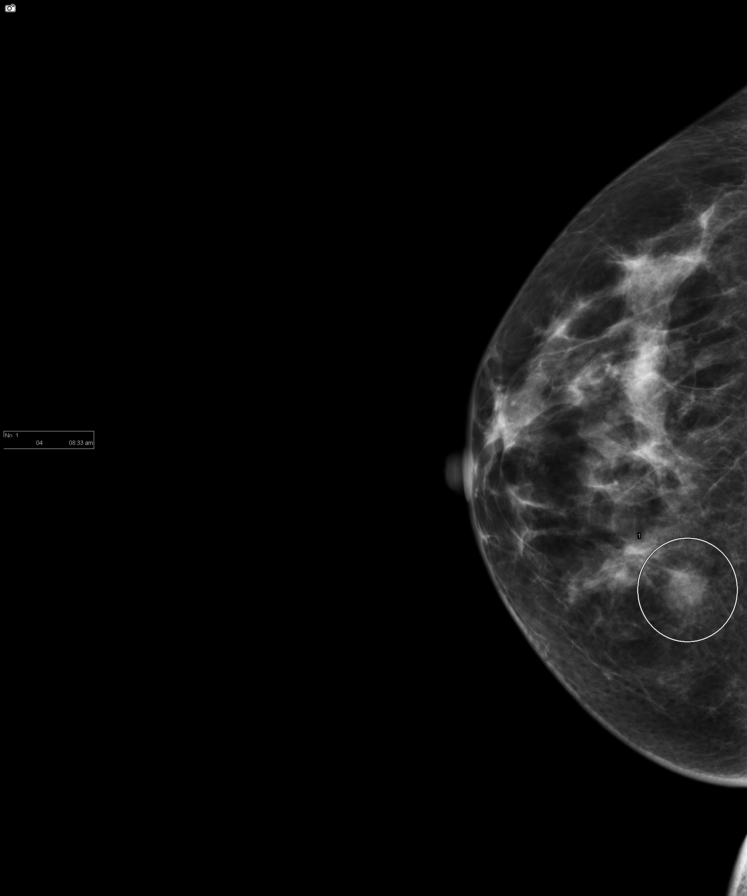

[L MLO]
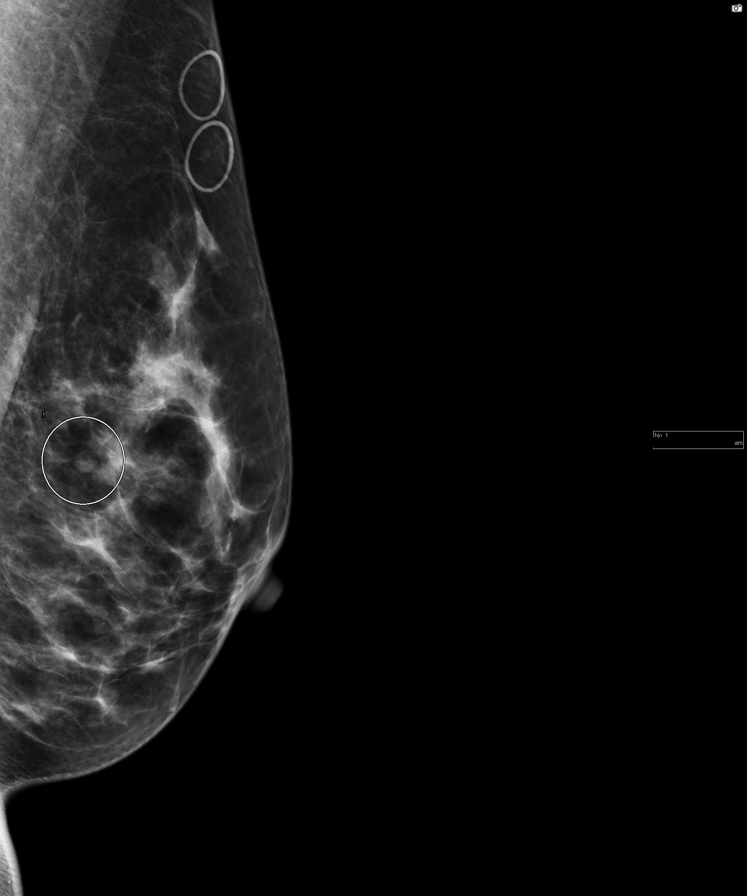

[8 of 8 positions shown; findings below may reference images not displayed]

ACR Breast Density Category c: The breast tissue is heterogeneously
dense, which may obscure small masses.
FINDINGS: In the right breast a possible asymmetry in the lower inner
quadrant, posteriorly requires further evaluation.

In the left breast a possible asymmetry in the posterior central
left breast on one of the oblique views requires further evaluation.

Images were processed with CAD.
IMPRESSION: Further evaluation is suggested for possible asymmetries in both
breasts.

RECOMMENDATION:
Diagnostic mammogram and possibly ultrasound of both breasts.
(Code:[LG])

The patient will be contacted regarding the findings, and additional
imaging will be scheduled.

BI-RADS CATEGORY  0: Incomplete. Need additional imaging evaluation
and/or prior mammograms for comparison.

## 2016-04-08 ENCOUNTER — Other Ambulatory Visit: Payer: Self-pay | Admitting: Family Medicine

## 2016-04-08 DIAGNOSIS — R928 Other abnormal and inconclusive findings on diagnostic imaging of breast: Secondary | ICD-10-CM

## 2016-04-17 DIAGNOSIS — M5416 Radiculopathy, lumbar region: Secondary | ICD-10-CM | POA: Insufficient documentation

## 2016-04-17 DIAGNOSIS — M533 Sacrococcygeal disorders, not elsewhere classified: Secondary | ICD-10-CM | POA: Insufficient documentation

## 2016-04-18 ENCOUNTER — Ambulatory Visit
Admission: RE | Admit: 2016-04-18 | Discharge: 2016-04-18 | Disposition: A | Discharge status: Home / Self Care | Payer: BLUE CROSS/BLUE SHIELD | Source: Ambulatory Visit | Attending: Family Medicine | Admitting: Family Medicine

## 2016-04-18 DIAGNOSIS — R928 Other abnormal and inconclusive findings on diagnostic imaging of breast: Secondary | ICD-10-CM

## 2016-04-18 DIAGNOSIS — N63 Unspecified lump in breast: Secondary | ICD-10-CM | POA: Diagnosis not present

## 2016-04-18 IMAGING — MG MM DIGITAL DIAGNOSTIC BILAT W/ TOMO W/ CAD
8 of 20 series · 8 of 40 positions shown · non-contrast
Comparison: Previous exam(s).

CLINICAL DATA: Screening recall for possible bilateral masses from
baseline exam. Additionally, the patient describes diffuse nonfocal
bilateral breast pain.

EXAM:
2D DIGITAL DIAGNOSTIC BILATERAL MAMMOGRAM WITH CAD AND ADJUNCT TOMO
BILATERAL BREAST ULTRASOUND

[R CC]
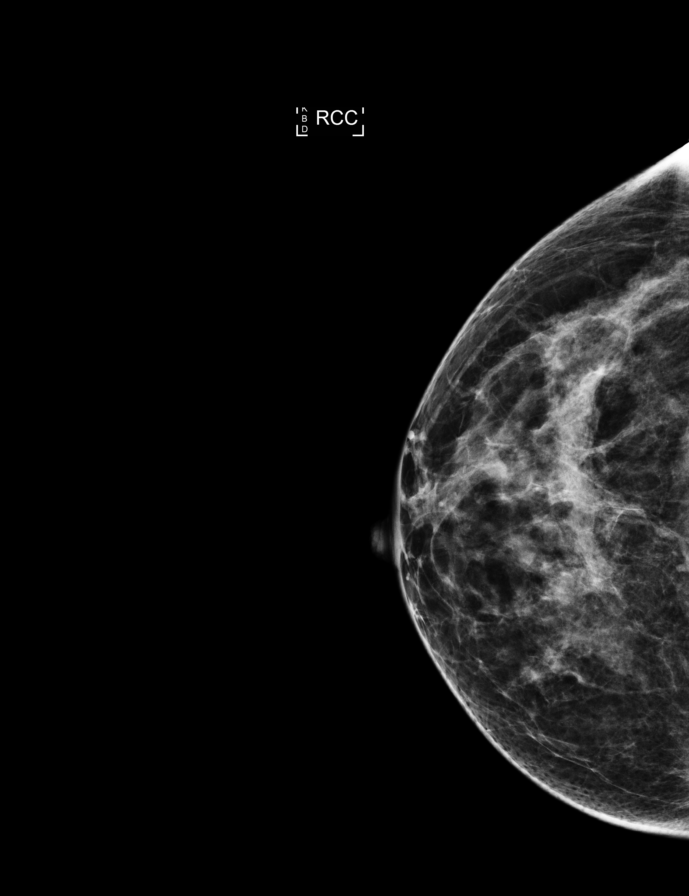

[L MLO synth-2D]
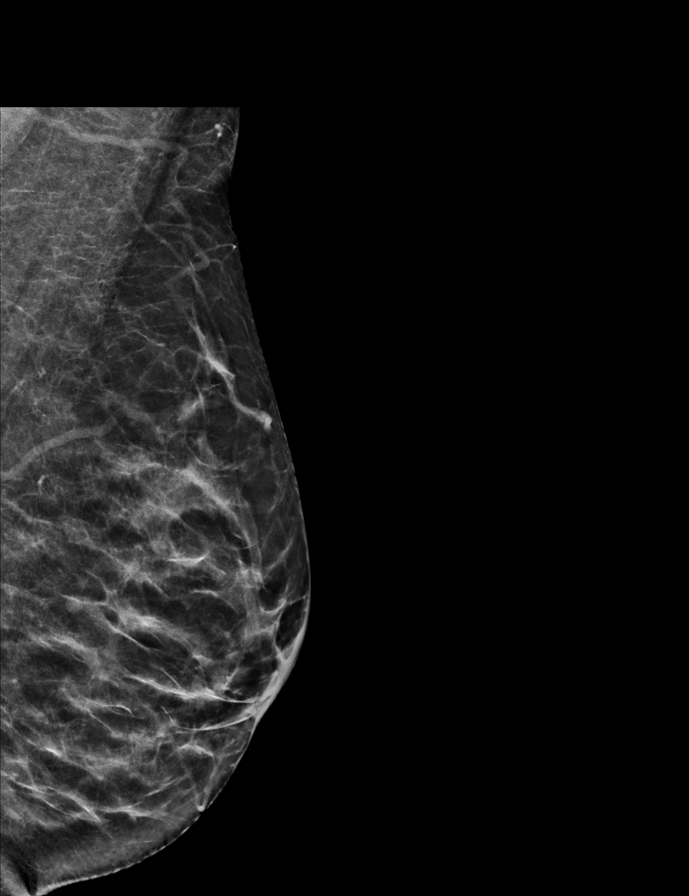

[R XCCL]
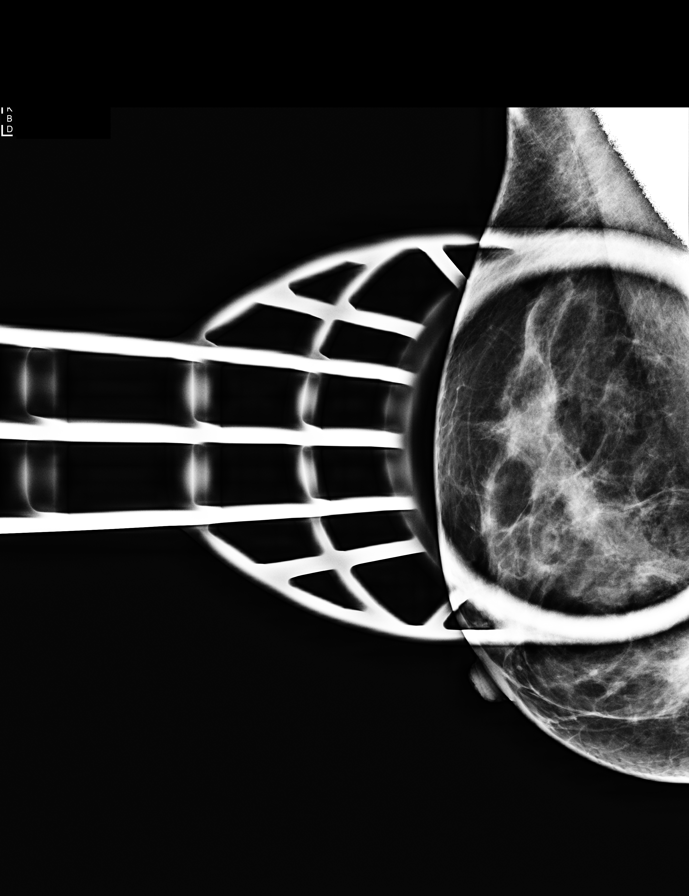

[L CC]
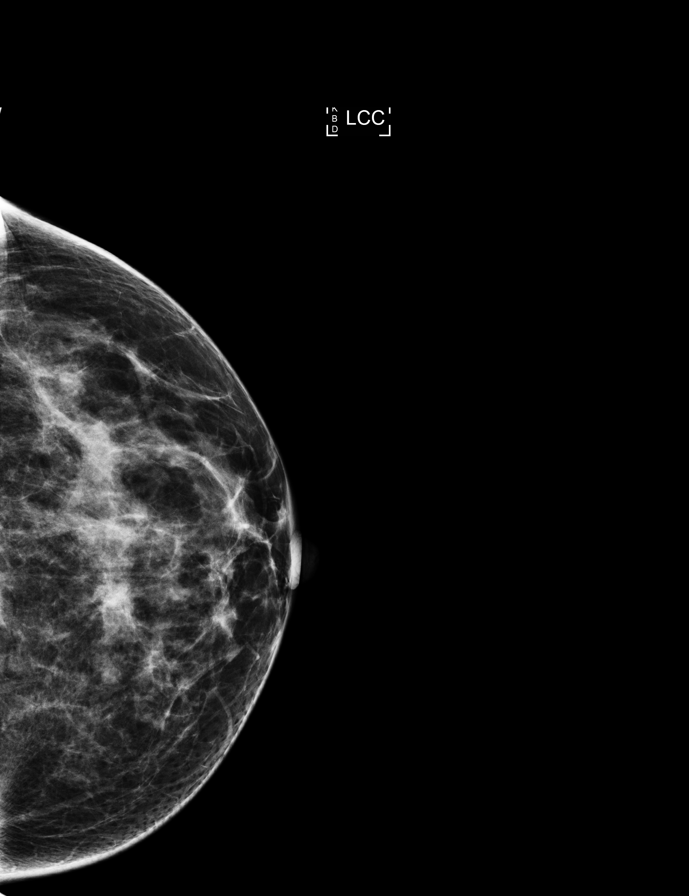

[L CC synth-2D]
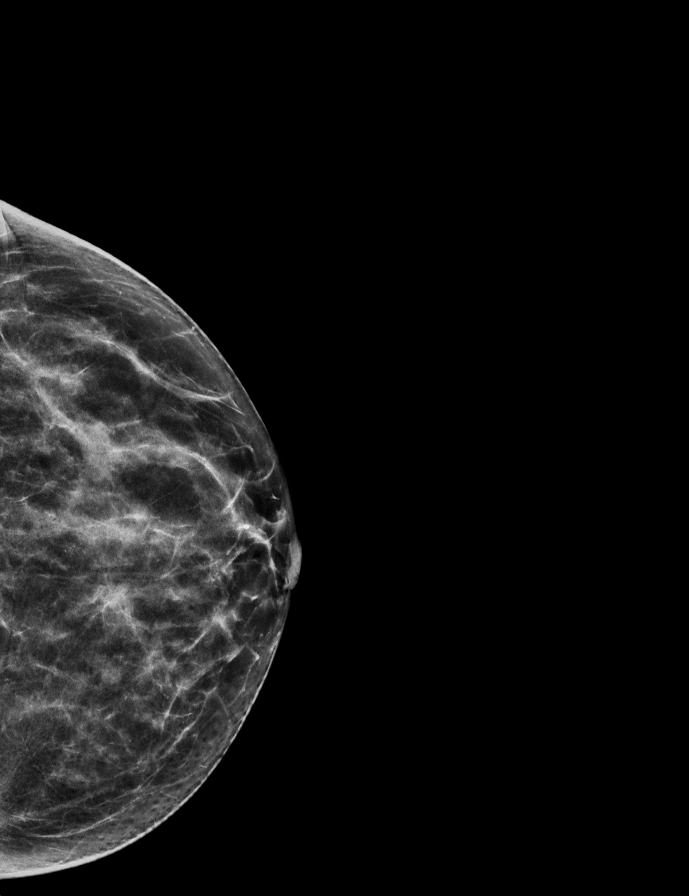

[R MLO synth-2D (1 of 2)]
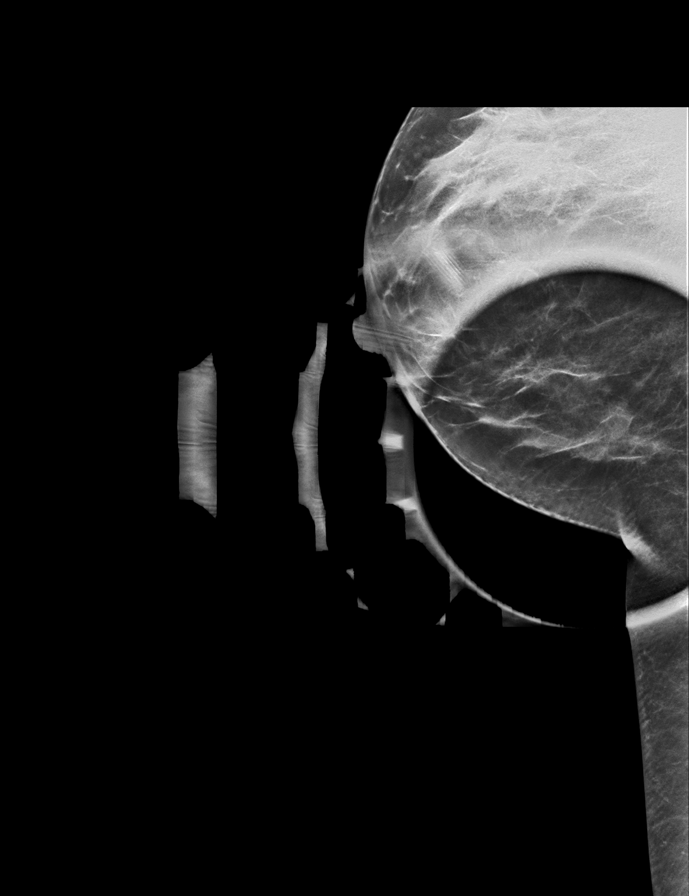

[R XCCL synth-2D]
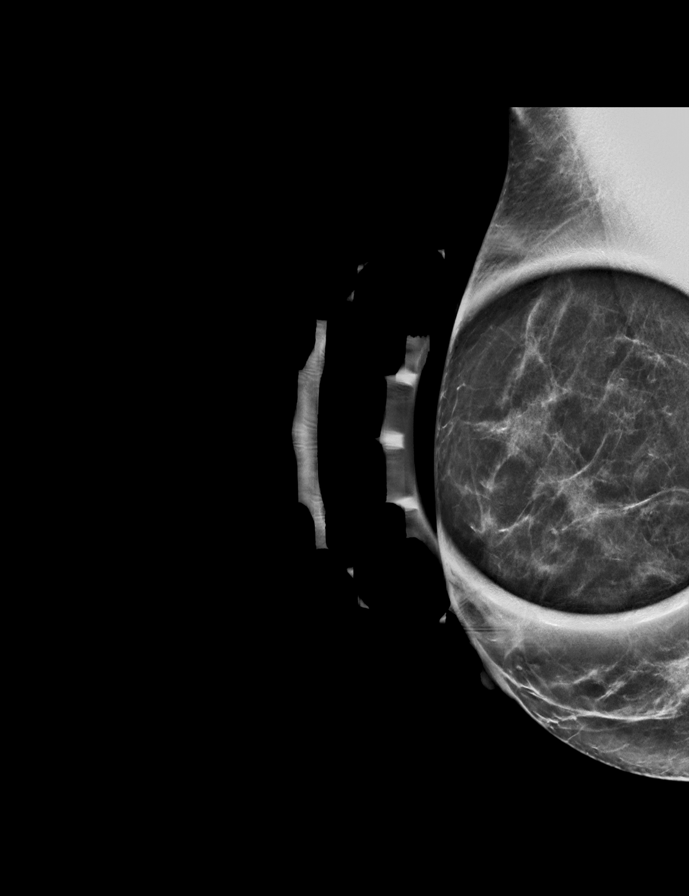

[R MLO synth-2D (2 of 2)]
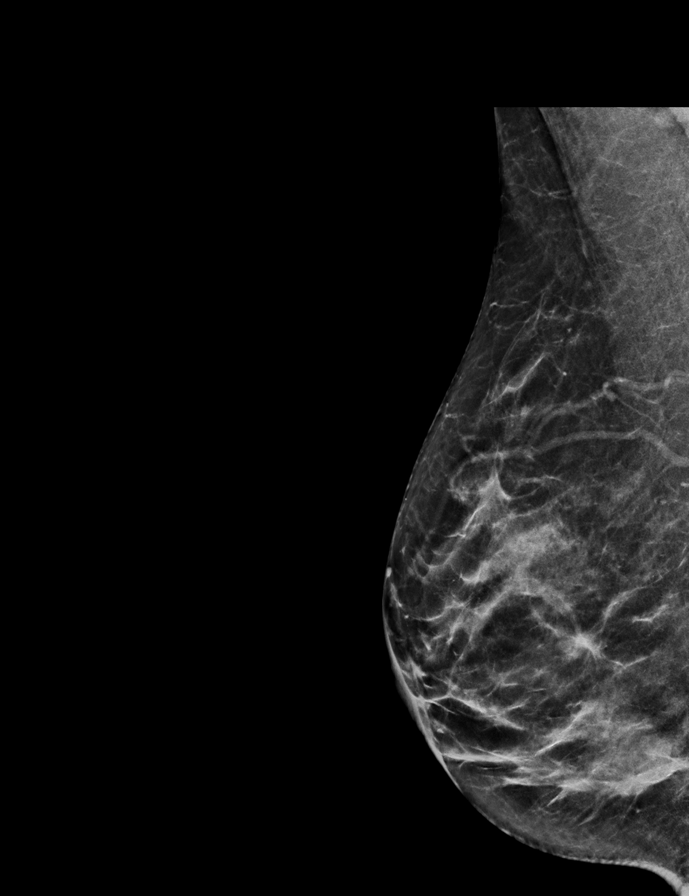

[8 of 40 positions shown; findings below may reference images not displayed]

ACR Breast Density Category c: The breast tissue is heterogeneously
dense, which may obscure small masses.
FINDINGS: No definite masses are identified on the additional spot compression
tomosynthesis images of the right breast, however due to the density
in this region, ultrasound will be performed for further evaluation.
In the slightly medial left breast, posterior depth there is a
circumscribed oval mass measuring 5 mm.

Mammographic images were processed with CAD.

Physical exam of the lower outer and lower inner right breast
demonstrates no discrete palpable masses.

Physical exam of the medial left breast demonstrates no discrete
palpable masses.

Ultrasound of the inferior right breast which was scanned from the 4
o'clock to the 8 o'clock position demonstrates multiple
circumscribed oval anechoic masses compatible with benign cysts. No
solid masses or suspicious areas of shadowing are identified.

Ultrasound of the medial left breast demonstrates a few small
anechoic circumscribed masses. There is a 5 mm hypoechoic mass at 9
o'clock, 1 cm from the nipple, likely representing a complicated
cyst.
IMPRESSION: 1. There is a 5 mm probably benign mass in the left breast at 9
o'clock, favored to represent a complicated cyst.

2. Benign bilateral fibrocystic change is noted. No mammographic
evidence of malignancy in the bilateral breasts.

RECOMMENDATION:
Six-month follow-up ultrasound of the left breast is recommended to
ensure continued stability of the probably benign 5 mm mass at 9
o'clock.

I have discussed the findings and recommendations with the patient.
Results were also provided in writing at the conclusion of the
visit. If applicable, a reminder letter will be sent to the patient
regarding the next appointment.

BI-RADS CATEGORY  3: Probably benign.

## 2016-07-11 ENCOUNTER — Ambulatory Visit
Admission: EM | Admit: 2016-07-11 | Discharge: 2016-07-11 | Disposition: A | Discharge status: Home / Self Care | Payer: BLUE CROSS/BLUE SHIELD | Attending: Family Medicine | Admitting: Family Medicine

## 2016-07-11 ENCOUNTER — Ambulatory Visit (INDEPENDENT_AMBULATORY_CARE_PROVIDER_SITE_OTHER): Payer: BLUE CROSS/BLUE SHIELD

## 2016-07-11 DIAGNOSIS — S63094A Other dislocation of right wrist and hand, initial encounter: Secondary | ICD-10-CM

## 2016-07-11 DIAGNOSIS — M25531 Pain in right wrist: Secondary | ICD-10-CM

## 2016-07-11 DIAGNOSIS — S63091A Other subluxation of right wrist and hand, initial encounter: Secondary | ICD-10-CM

## 2016-07-11 HISTORY — DX: Supraventricular tachycardia: I47.1

## 2016-07-11 HISTORY — DX: Supraventricular tachycardia, unspecified: I47.10

## 2016-07-11 IMAGING — CR DG WRIST COMPLETE 3+V*R*
4 series · 4 of 4 positions shown · non-contrast
Comparison: None.

CLINICAL DATA: Pt with ulnar right wrist pain after straining it
today lifting a large load of laundry. Pt hx of right wrist fracture
with metal plate [78].

EXAM:
RIGHT WRIST - COMPLETE 3+ VIEW

[wrist pa]
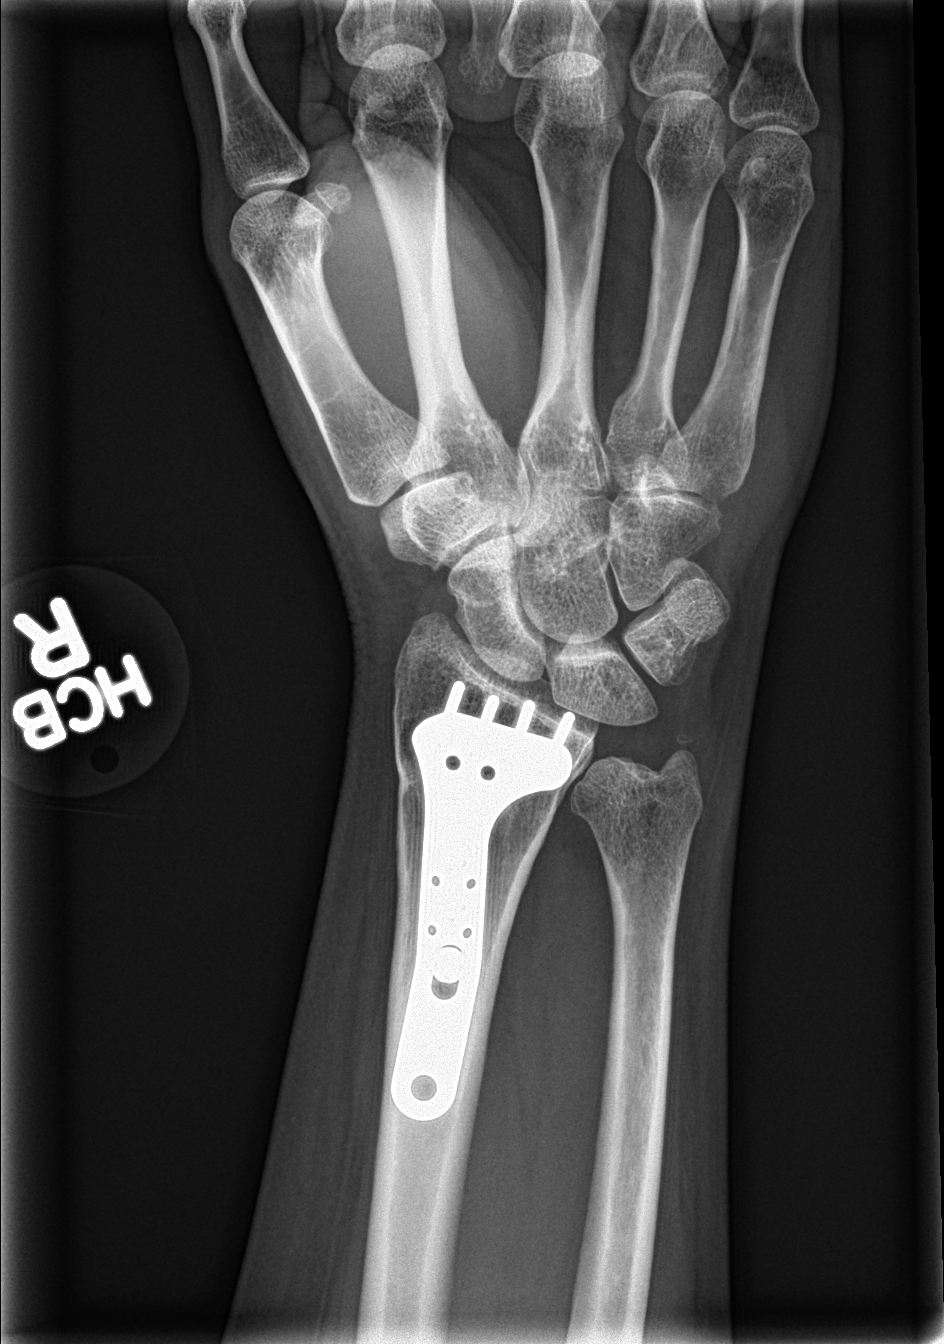

[wrist obl]
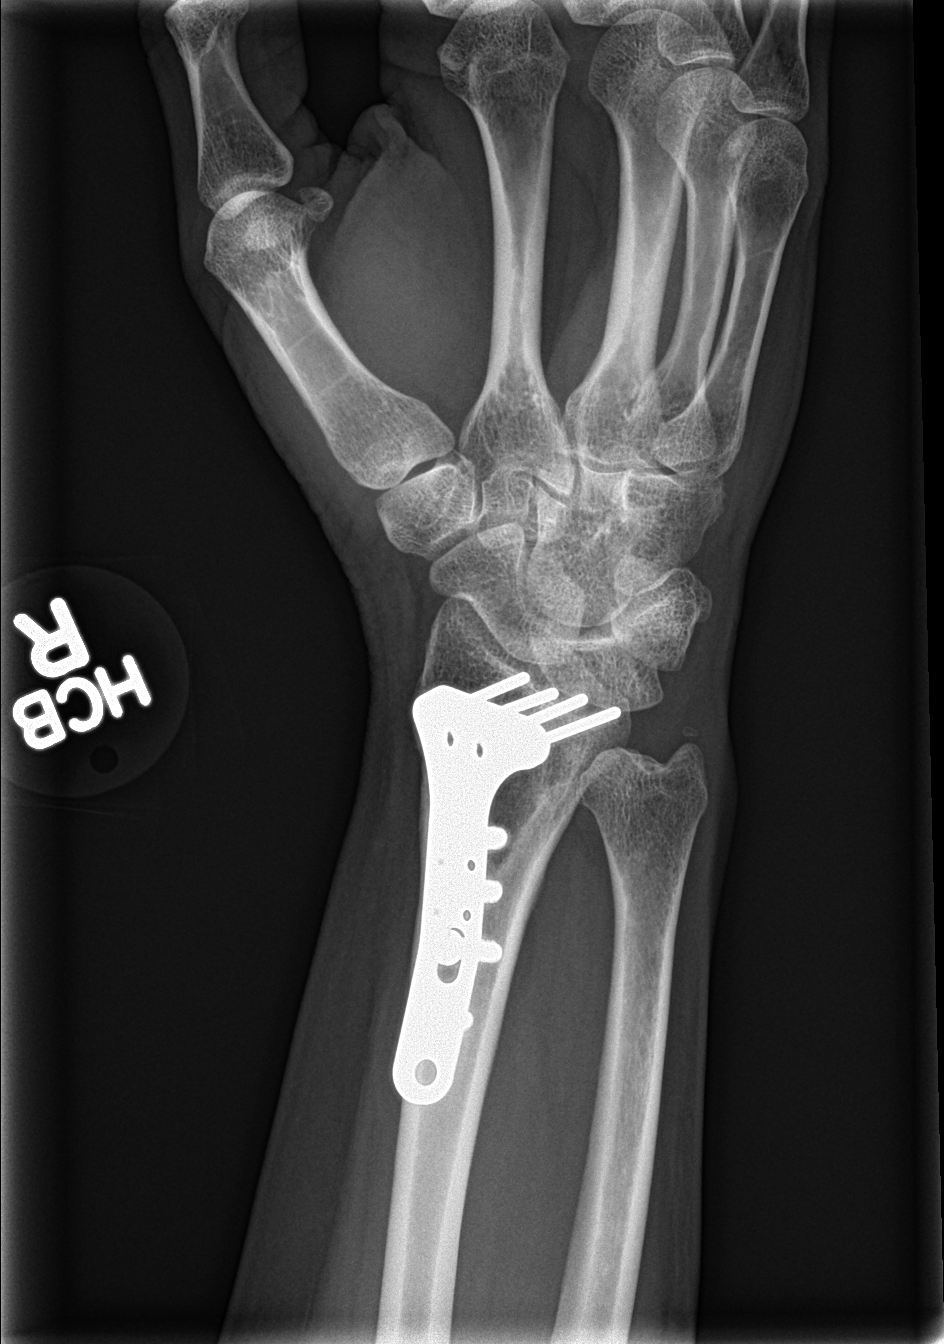

[wrist lat]
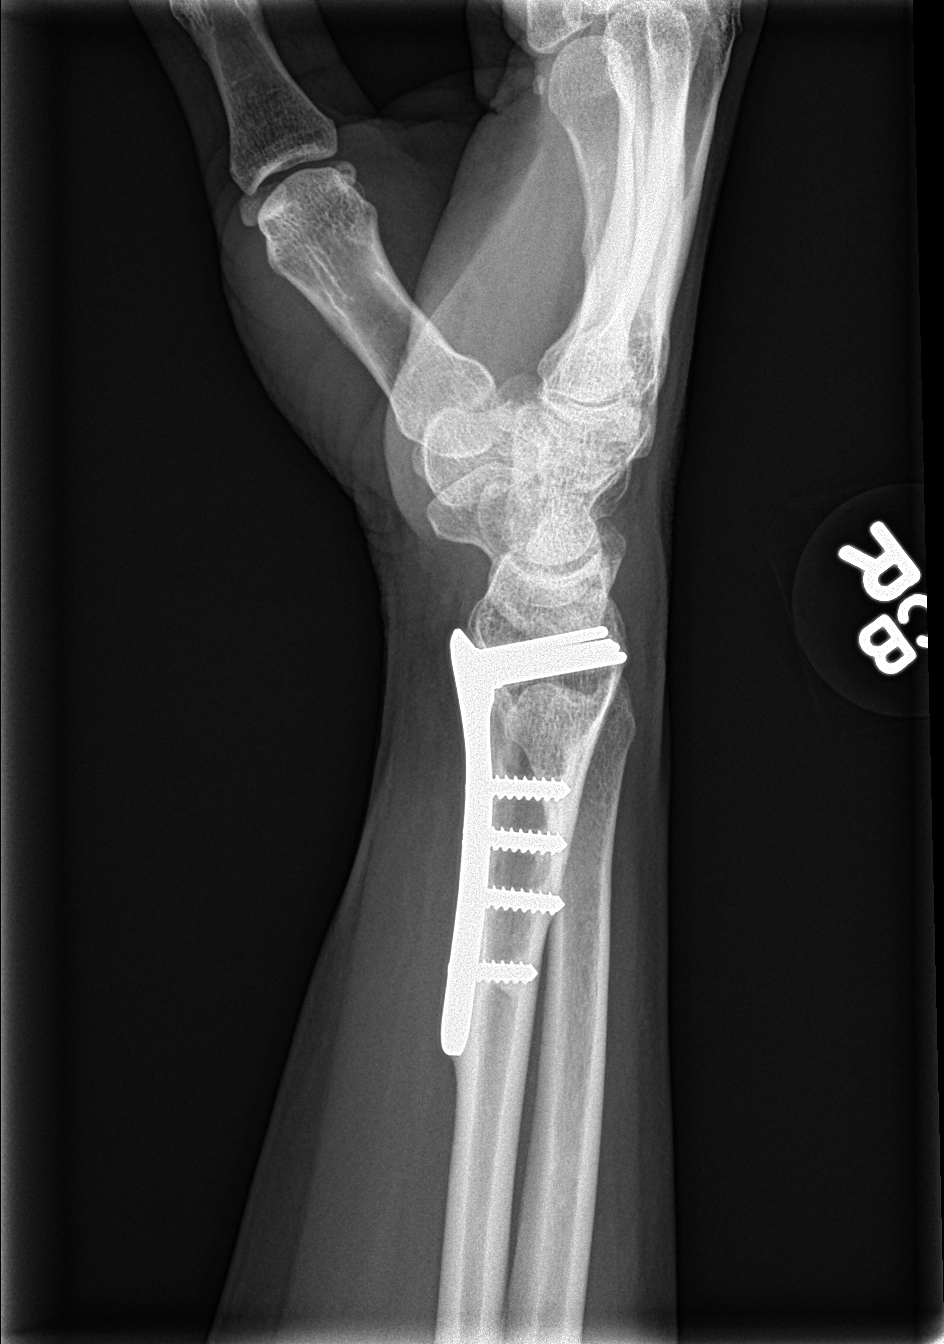

[wrist navicular]
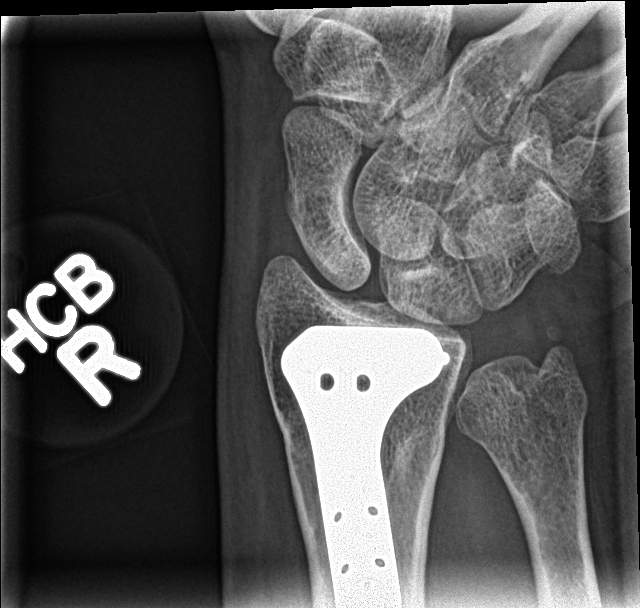

[4 of 4 positions shown; findings below may reference images not displayed]

FINDINGS: No acute fracture.  No dislocation.

Or old fracture of the distal radius has been reduced with a volar
fixation plate and screws. The orthopedic hardware is well-seated.
Fracture is well healed. There is a small, old, associated ulnar
styloid fracture.

The joints are normally spaced and aligned. No significant
arthropathic change.

Soft tissues are unremarkable.
IMPRESSION: 1. No acute fracture.
2. No evidence of loosening of the distal radius orthopedic
hardware.
3. No arthropathic change.

## 2016-07-11 MED ORDER — NAPROXEN 500 MG PO TABS: 500.0000 mg | ORAL_TABLET | Freq: Two times a day (BID) | ORAL | 0 refills | Status: DC

## 2016-07-11 NOTE — ED Provider Notes (Signed)
CSN: [id number]     Arrival date & time 07/11/16  1720 History   First MD Initiated Contact with Patient 07/11/16 1803     Chief Complaint  Patient presents with  . Wrist Pain   (Consider location/radiation/quality/duration/timing/severity/associated sxs/prior Treatment) HPI   Is a 40 year old female who presents with right dominant wrist pain ulnarly and occurred while she was unloading laundry. She states that several blankets had become intertwined creating a large ball. She is trying to unload them she felt a pop over the ulnar aspect of her wrist and then noticed numbness and tingling in an ulnar distribution. Since that time she's been using ice and elevation along with ibuprofen but has continued to bother her and she became alarmed with the numbness and tingling. Fracture of her radius in the past which required ORIF she also had a carpal tunnel release performed.   Past Medical History:  Diagnosis Date  . SVT (supraventricular tachycardia) (HCC)    Past Surgical History:  Procedure Laterality Date  . ABDOMINAL HYSTERECTOMY    . CARDIAC ELECTROPHYSIOLOGY MAPPING AND ABLATION    . CARDIAC SURGERY     ablasion  . WRIST SURGERY     No family history on file. Social History  Substance Use Topics  . Smoking status: Former Games developer  . Smokeless tobacco: Never Used  . Alcohol use No   OB History    No data available     Review of Systems  Constitutional: Positive for activity change. Negative for appetite change, chills, fatigue and fever.  Musculoskeletal: Positive for arthralgias and myalgias.  All other systems reviewed and are negative.   Allergies  Review of patient's allergies indicates no known allergies.  Home Medications   Prior to Admission medications   Medication Sig Start Date End Date Taking? Authorizing Provider  butalbital-acetaminophen-caffeine (FIORICET) 50-325-40 MG tablet Take 1-2 tablets by mouth every 6 (six) hours as needed for headache. 10/16/15  10/15/16  Myrna Blazer, MD  cyclobenzaprine (FLEXERIL) 10 MG tablet Take 1 tablet (10 mg total) by mouth at bedtime. 11/12/15   Payton Mccallum, MD  HYDROcodone-acetaminophen (NORCO) 5-325 MG tablet Take 1 tablet by mouth every 8 (eight) hours as needed for moderate pain. 03/24/16   Hassan Rowan, MD  HYDROcodone-acetaminophen (NORCO/VICODIN) 5-325 MG tablet Take 1 tablet by mouth every 6 (six) hours as needed. 11/12/15   Payton Mccallum, MD  meloxicam (MOBIC) 15 MG tablet Take 1 tablet (15 mg total) by mouth daily. 03/24/16   Hassan Rowan, MD  naproxen (NAPROSYN) 500 MG tablet Take 1 tablet (500 mg total) by mouth 2 (two) times daily with a meal. 07/11/16   Lutricia Feil, PA-C  orphenadrine (NORFLEX) 100 MG tablet Take 1 tablet (100 mg total) by mouth 2 (two) times daily. 03/24/16   Hassan Rowan, MD  predniSONE (STERAPRED UNI-PAK 21 TAB) 10 MG (21) TBPK tablet Sig 6 tablet day 1, 5 tablets day 2, 4 tablets day 3,,3tablets day 4, 2 tablets day 5, 1 tablet day 6 take all tablets orally 03/24/16   Hassan Rowan, MD   Meds Ordered and Administered this Visit  Medications - No data to display  BP 118/73 (BP Location: Left Arm)   Pulse 76   Temp 98.1 F (36.7 C) (Oral)   Resp 16   Ht  (1.626 m)   Wt 135 lb (61.2 kg)   SpO2 100%   BMI 23.17 kg/m  No data found.   Physical Exam  Constitutional: She is  oriented to person, place, and time. She appears well-developed and well-nourished. No distress.  HENT:  Head: Normocephalic and atraumatic.  Eyes: EOM are normal. Pupils are equal, round, and reactive to light.  Neck: Normal range of motion. Neck supple.  Musculoskeletal: She exhibits tenderness. She exhibits no edema or deformity.  Examination of the right wrist shows no significant swelling ecchymosis or warmth or erythema. Wrist range of motion is limited to extension lacking approximately 40 into flexion to 80. Sensation shows a hip esthesia to light touch of the ulnar aspect of the  ring finger and little finger. She is maximal tender over the ulnar styloid and the ulna dorsally and volarly which reproduces her pain. This is particularly noticed over the volar aspect where a Tinel's causes her pain but no increase in the tingling.  Neurological: She is alert and oriented to person, place, and time.  Skin: Skin is warm and dry. She is not diaphoretic.  Psychiatric: She has a normal mood and affect. Her behavior is normal. Judgment and thought content normal.  Nursing note and vitals reviewed.   Urgent Care Course   Clinical Course    Procedures (including critical care time)  Labs Review Labs Reviewed - No data to display  Imaging Review Dg Wrist Complete Right  Result Date: 07/11/2016 CLINICAL DATA:  Pt with ulnar right wrist pain after straining it today lifting a large load of laundry. Pt hx of right wrist fracture with metal plate [id number]. EXAM: RIGHT WRIST - COMPLETE 3+ VIEW COMPARISON:  None. FINDINGS: No acute fracture.  No dislocation. Or old fracture of the distal radius has been reduced with a volar fixation plate and screws. The orthopedic hardware is well-seated. Fracture is well healed. There is a small, old, associated ulnar styloid fracture. The joints are normally spaced and aligned. No significant arthropathic change. Soft tissues are unremarkable. IMPRESSION: 1. No acute fracture. 2. No evidence of loosening of the distal radius orthopedic hardware. 3. No arthropathic change. Electronically Signed   By: Amie Portland M.D.   On: 07/11/2016 19:27    Visual Acuity Review  Right Eye Distance:   Left Eye Distance:   Bilateral Distance:    Right Eye Near:   Left Eye Near:    Bilateral Near:     Patient was given a right wrist splint    MDM   1. Right wrist pain   2. Subluxation of extensor carpi ulnaris tendon, right, initial encounter    Discharge Medication List as of 07/11/2016  7:42 PM    START taking these medications   Details   naproxen (NAPROSYN) 500 MG tablet Take 1 tablet (500 mg total) by mouth 2 (two) times daily with a meal., Starting Fri 07/11/2016, Normal      Plan: 1. Test/x-ray results and diagnosis reviewed with patient 2. rx as per orders; risks, benefits, potential side effects reviewed with patient 3. Recommend supportive treatment with Splint during activities and at nighttime. Her anti-inflammatory medications for pain. She continues to have difficulties with pain despite conservative care she should be seen by a hand surgeon. 4. F/u prn if symptoms worsen or don't improve     Lutricia Feil, PA-C 07/11/16 1954

## 2016-07-11 NOTE — ED Triage Notes (Signed)
Pt states she was pulling a sheet out of laundry and her right wrist popped today.. States she had Fx repair of that wrist several years ago.

## 2016-09-18 ENCOUNTER — Encounter: Payer: Self-pay | Admitting: *Deleted

## 2016-09-18 ENCOUNTER — Ambulatory Visit
Admission: EM | Admit: 2016-09-18 | Discharge: 2016-09-18 | Disposition: A | Discharge status: Home / Self Care | Payer: BLUE CROSS/BLUE SHIELD | Attending: Family Medicine | Admitting: Family Medicine

## 2016-09-18 DIAGNOSIS — B9789 Other viral agents as the cause of diseases classified elsewhere: Secondary | ICD-10-CM | POA: Diagnosis not present

## 2016-09-18 DIAGNOSIS — J069 Acute upper respiratory infection, unspecified: Secondary | ICD-10-CM | POA: Diagnosis not present

## 2016-09-18 LAB — RAPID INFLUENZA A&B ANTIGENS
Influenza A (ARMC): NEGATIVE
Influenza B (ARMC): NEGATIVE

## 2016-09-18 LAB — RAPID STREP SCREEN (MED CTR MEBANE ONLY): STREPTOCOCCUS, GROUP A SCREEN (DIRECT): NEGATIVE

## 2016-09-18 MED ORDER — BENZONATATE 200 MG PO CAPS: 200.0000 mg | ORAL_CAPSULE | Freq: Three times a day (TID) | ORAL | 0 refills | Status: DC

## 2016-09-18 MED ORDER — FLUTICASONE PROPIONATE 50 MCG/ACT NA SUSP: 2.0000 | Freq: Every day | NASAL | 0 refills | Status: DC

## 2016-09-18 MED ORDER — HYDROCOD POLST-CPM POLST ER 10-8 MG/5ML PO SUER: 5.0000 mL | Freq: Two times a day (BID) | ORAL | 0 refills | Status: DC

## 2016-09-18 NOTE — ED Triage Notes (Signed)
Patient started having symptoms of sore throat 4 days ago followed by nasal congestion, cough, and bilateral ear pain.

## 2016-09-18 NOTE — ED Provider Notes (Signed)
CSN: [id number]     Arrival date & time 09/18/16  1424 History   First MD Initiated Contact with Patient 09/18/16 1512     Chief Complaint  Patient presents with  . Sore Throat  . Nasal Congestion  . Otalgia   (Consider location/radiation/quality/duration/timing/severity/associated sxs/prior Treatment) HPI  This is a 40 year old female who presents with symptoms started as a sore throat 4 days ago now having nasal congestion nonproductive cough and bilateral ear pain. Running a low-grade fever at home 99-100 along with body aches and fatigue. Today her pulse rate is 113 temperature is 98.2 but she took Tylenol earlier today.       Past Medical History:  Diagnosis Date  . SVT (supraventricular tachycardia) (HCC)    Past Surgical History:  Procedure Laterality Date  . ABDOMINAL HYSTERECTOMY    . CARDIAC ELECTROPHYSIOLOGY MAPPING AND ABLATION    . CARDIAC SURGERY     ablasion  . WRIST SURGERY     History reviewed. No pertinent family history. Social History  Substance Use Topics  . Smoking status: Current Every Day Smoker  . Smokeless tobacco: Never Used  . Alcohol use No   OB History    No data available     Review of Systems  Constitutional: Positive for activity change, chills, fatigue and fever.  HENT: Positive for congestion, ear pain, postnasal drip, rhinorrhea, sinus pressure and sore throat.   Respiratory: Positive for cough. Negative for shortness of breath, wheezing and stridor.   All other systems reviewed and are negative.   Allergies  Review of patient's allergies indicates no known allergies.  Home Medications   Prior to Admission medications   Medication Sig Start Date End Date Taking? Authorizing Provider  benzonatate (TESSALON) 200 MG capsule Take 1 capsule (200 mg total) by mouth every 8 (eight) hours. 09/18/16   Lutricia Feil, PA-C  chlorpheniramine-HYDROcodone (TUSSIONEX PENNKINETIC ER) 10-8 MG/5ML SUER Take 5 mLs by mouth 2 (two) times  daily. 09/18/16   Lutricia Feil, PA-C  fluticasone (FLONASE) 50 MCG/ACT nasal spray Place 2 sprays into both nostrils daily. 09/18/16   Lutricia Feil, PA-C   Meds Ordered and Administered this Visit  Medications - No data to display  BP 118/89 (BP Location: Left Arm)   Pulse (!) 113   Temp 98.2 F (36.8 C) (Oral)   Resp 18   Ht 5\' 4"  (1.626 m)   Wt 135 lb (61.2 kg)   SpO2 99%   BMI 23.17 kg/m  No data found.   Physical Exam  Constitutional: She is oriented to person, place, and time. She appears well-developed and well-nourished. No distress.  HENT:  Head: Normocephalic and atraumatic.  Right Ear: External ear normal.  Left Ear: External ear normal.  Nose: Nose normal.  Mouth/Throat: Oropharynx is clear and moist. No oropharyngeal exudate.  Oropharynx is slightly erythematous but no exudate is seen.  Eyes: EOM are normal. Pupils are equal, round, and reactive to light. Right eye exhibits no discharge. Left eye exhibits no discharge.  Neck: Normal range of motion. Neck supple.  Pulmonary/Chest: Effort normal and breath sounds normal. No respiratory distress. She has no wheezes. She has no rales.  Musculoskeletal: Normal range of motion.  Lymphadenopathy:    She has no cervical adenopathy.  Neurological: She is alert and oriented to person, place, and time.  Skin: Skin is warm and dry. No rash noted. She is not diaphoretic.  Psychiatric: She has a normal mood and affect. Her behavior  is normal. Judgment and thought content normal.  Nursing note and vitals reviewed.   Urgent Care Course   Clinical Course    Procedures (including critical care time)  Labs Review Labs Reviewed  RAPID STREP SCREEN (NOT AT [facility])  RAPID INFLUENZA A&B ANTIGENS ([facility] ONLY)  CULTURE, GROUP A STREP [facility])    Imaging Review No results found.   Visual Acuity Review  Right Eye Distance:   Left Eye Distance:   Bilateral Distance:    Right Eye Near:   Left Eye Near:    Bilateral  Near:         MDM   1. Viral upper respiratory tract infection    New Prescriptions   BENZONATATE (TESSALON) 200 MG CAPSULE    Take 1 capsule (200 mg total) by mouth every 8 (eight) hours.   CHLORPHENIRAMINE-HYDROCODONE (TUSSIONEX PENNKINETIC ER) 10-8 MG/5ML SUER    Take 5 mLs by mouth 2 (two) times daily.   FLUTICASONE (FLONASE) 50 MCG/ACT NASAL SPRAY    Place 2 sprays into both nostrils daily.  Plan: 1. Test/x-ray results and diagnosis reviewed with patient 2. rx as per orders; risks, benefits, potential side effects reviewed with patient 3. Recommend supportive treatment with Rest and fluids. Keep her out of work Tomorrow to let her return to work on Sunday. Follow-up with her primary care physician if she is not improving. 4. F/u prn if symptoms worsen or don't improve     Lutricia FeilWilliam P Kati Riggenbach, PA-C 09/18/16 1621

## 2016-09-20 ENCOUNTER — Telehealth: Payer: Self-pay

## 2016-09-20 NOTE — Telephone Encounter (Signed)
Courtesy call back completed today after patients visit at [facility]. Patient improved and will follow up with their PCP if symptoms continue or worsen.   

## 2016-09-21 ENCOUNTER — Telehealth: Payer: Self-pay | Admitting: Emergency Medicine

## 2016-09-21 LAB — CULTURE, GROUP A STREP (THRC)

## 2016-09-21 MED ORDER — AMOXICILLIN 875 MG PO TABS: 875.0000 mg | ORAL_TABLET | Freq: Two times a day (BID) | ORAL | 0 refills | Status: AC

## 2016-09-21 NOTE — Telephone Encounter (Signed)
Patient returned our phone.  Patient informed of her Positive throat culture results and would [facility] [place].  Amoxicillin 875mg  1 tablet twice a day for 10 days per Dr. Thurmond ButtsWade [facility] [place].

## 2017-12-22 ENCOUNTER — Ambulatory Visit
Admission: EM | Admit: 2017-12-22 | Discharge: 2017-12-22 | Disposition: A | Discharge status: Home / Self Care | Payer: BLUE CROSS/BLUE SHIELD | Attending: Family Medicine | Admitting: Family Medicine

## 2017-12-22 ENCOUNTER — Other Ambulatory Visit: Payer: Self-pay

## 2017-12-22 ENCOUNTER — Encounter: Payer: Self-pay | Admitting: *Deleted

## 2017-12-22 DIAGNOSIS — M26601 Right temporomandibular joint disorder, unspecified: Secondary | ICD-10-CM

## 2017-12-22 DIAGNOSIS — G501 Atypical facial pain: Secondary | ICD-10-CM

## 2017-12-22 DIAGNOSIS — M26659 Arthropathy of unspecified temporomandibular joint: Secondary | ICD-10-CM

## 2017-12-22 DIAGNOSIS — M26609 Unspecified temporomandibular joint disorder, unspecified side: Principal | ICD-10-CM

## 2017-12-22 HISTORY — DX: Migraine, unspecified, not intractable, without status migrainosus: G43.909

## 2017-12-22 MED ORDER — NAPROXEN 500 MG PO TABS: 500.0000 mg | ORAL_TABLET | Freq: Two times a day (BID) | ORAL | 0 refills | Status: DC

## 2017-12-22 MED ORDER — HYDROCODONE-ACETAMINOPHEN 5-325 MG PO TABS: 2.0000 | ORAL_TABLET | ORAL | 0 refills | Status: DC | PRN

## 2017-12-22 MED ORDER — KETOROLAC TROMETHAMINE 60 MG/2ML IM SOLN
60.0000 mg | Freq: Once | INTRAMUSCULAR | Status: AC
  Administered 2017-12-22: 60 mg via INTRAMUSCULAR

## 2017-12-22 MED ORDER — BACLOFEN 10 MG PO TABS: 5.0000 mg | ORAL_TABLET | Freq: Three times a day (TID) | ORAL | 0 refills | Status: DC

## 2017-12-22 NOTE — ED Provider Notes (Signed)
[facility]    CSN: [id number] Arrival date & time: 12/22/17  1144     History   Chief Complaint Chief Complaint  Patient presents with  . Otalgia    HPI Sandra Doyle is a 42 y.o. female.   HPI  42 year old female who presents with an onset at 3 AM this morning of right-sided facial pain.  She states that the pain is excruciating as if "somebody is tearing my skin off my face".  She does state that it hurts to chew.  From the ear over the cheek to midline of the face acting the lower jaw as well.  It does not extend above the eyebrows.  Been constant.  She did seek and receive some relief from a hot compress to the side of her face.  The pain has not significantly relieved since this morning.  He has no previous history of type of pain.  Does not know if she grinds her teeth at night.  Her husband evidently is not complained he is afebrile       Past Medical History:  Diagnosis Date  . Migraines   . SVT (supraventricular tachycardia) (HCC)     There are no active problems to display for this patient.   Past Surgical History:  Procedure Laterality Date  . ABDOMINAL HYSTERECTOMY    . CARDIAC ELECTROPHYSIOLOGY MAPPING AND ABLATION    . CARDIAC SURGERY     ablasion  . WRIST SURGERY      OB History    No data available       Home Medications    Prior to Admission medications   Medication Sig Start Date End Date Taking? Authorizing Provider  baclofen (LIORESAL) 10 MG tablet Take 0.5 tablets (5 mg total) by mouth 3 (three) times daily. 12/22/17   Lutricia Feil, PA-C  butalbital-acetaminophen-caffeine (FIORICET, ESGIC) [phone] MG tablet Take by mouth 2 (two) times daily as needed for headache.    [provider]  HYDROcodone-acetaminophen (NORCO/VICODIN) 5-325 MG tablet Take 2 tablets by mouth every 4 (four) hours as needed. 12/22/17   Lutricia Feil, PA-C  naproxen (NAPROSYN) 500 MG tablet Take 1 tablet (500 mg total) by mouth 2 (two)  times daily with a meal. 12/22/17   Lutricia Feil, PA-C  SUMAtriptan (IMITREX) 100 MG tablet Take 100 mg by mouth every 2 (two) hours as needed for migraine. May repeat in 2 hours if headache persists or recurs.    [provider]    Family History Family History  Problem Relation Age of Onset  . Hypertension Mother   . Stroke Mother   . Osteoarthritis Mother   . Anuerysm Father     Social History Social History   Tobacco Use  . Smoking status: Current Every Day Smoker    Packs/day: 0.50    Types: Cigarettes  . Smokeless tobacco: Never Used  Substance Use Topics  . Alcohol use: No  . Drug use: No     Allergies   Patient has no known allergies.   Review of Systems Review of Systems  Constitutional: Positive for activity change and appetite change. Negative for chills, fatigue and fever.  HENT: Positive for ear pain and facial swelling.   All other systems reviewed and are negative.    Physical Exam Triage Vital Signs ED Triage Vitals  Enc Vitals Group     BP 12/22/17 1206 131/83     Pulse Rate 12/22/17 1206 81  Resp 12/22/17 1206 16     Temp 12/22/17 1206 98.3 F (36.8 C)     Temp Source 12/22/17 1206 Oral     SpO2 12/22/17 1206 100 %     Weight 12/22/17 1207 135 lb (61.2 kg)     Height 12/22/17 1207 5\' 4"  (1.626 m)     Head Circumference --      Peak Flow --      Pain Score 12/22/17 1208 10     Pain Loc --      Pain Edu? --      Excl. in GC? --    No data found.  Updated Vital Signs BP 131/83 (BP Location: Left Arm)   Pulse 81   Temp 98.3 F (36.8 C) (Oral)   Resp 16   Ht 5\' 4"  (1.626 m)   Wt 135 lb (61.2 kg)   SpO2 100%   BMI 23.17 kg/m   Visual Acuity Right Eye Distance:   Left Eye Distance:   Bilateral Distance:    Right Eye Near:   Left Eye Near:    Bilateral Near:     Physical Exam  Constitutional: She is oriented to person, place, and time. She appears well-developed and well-nourished. No distress.  HENT:  Head:  Normocephalic.  Right Ear: External ear normal.  Left Ear: External ear normal.  Nose: Nose normal.  Mouth/Throat: Oropharynx is clear and moist.  Examination of the face that shows no obvious swelling or edema erythema.  He does have a hypnesthesia light touch in a distribution extending from the preauricular along the cheek to the jawline so over the neck up to midway of her center face.  Tender intraoral at the juncture of her jaw at the TMJ.  Dentition is in good repair.  There is no obvious abscess is seen.  Externally the patient is also tender over the TMJ and is exacerbated with opening and closing the mouth.  NeuroLogical shows no weaknesses.  She has no eye watering.  Eyes: Pupils are equal, round, and reactive to light. Right eye exhibits no discharge. Left eye exhibits no discharge.  Neck: Normal range of motion.  Musculoskeletal: Normal range of motion.  Lymphadenopathy:    She has no cervical adenopathy.  Neurological: She is alert and oriented to person, place, and time.  Skin: Skin is warm and dry. She is not diaphoretic.  Psychiatric: She has a normal mood and affect. Her behavior is normal. Judgment and thought content normal.  Nursing note and vitals reviewed.    UC Treatments / Results  Labs (all labs ordered are listed, but only abnormal results are displayed) Labs Reviewed - No data to display  EKG  EKG Interpretation None       Radiology No results found.  Procedures Procedures (including critical care time)  Medications Ordered in UC Medications  ketorolac (TORADOL) injection 60 mg (60 mg Intramuscular Given 12/22/17 1306)   His pain went from a 10 down to a 5 after the Toradol injection  Initial Impression / Assessment and Plan / UC Course  I have reviewed the triage vital signs and the nursing notes.  Pertinent labs & imaging results that were available during my care of the patient were reviewed by me and considered in my medical decision making  (see chart for details).     Plan: 1. Test/x-ray results and diagnosis reviewed with patient 2. rx as per orders; risks, benefits, potential side effects reviewed with patient 3. Recommend supportive treatment  with compresses to the TMJ 20 minutes out of every 2 hours 4-5 times daily.  Use a soft diet for now minimal chewing.  Since this awoke the patient last night with the severe pain onset I suspect there may be bruxism involved.  Denies any hard chewing such as on jerky, hard breads etc.  Differential diagnosis should include trigeminal neuralgia although this is less likely.  We will provide her with Naprosyn as well as baclofen.  She is not improving she should follow-up with a dentist that specializes in TMJ.  I have also provided her with a few hydrocodone for severe exacerbation until the Naprosyn and baclofen begin to exert its effects.  [place] registry resulted with only 1 opioid prescribed this year for 10 pills. 4. F/u prn if symptoms worsen or don't improve   Final Clinical Impressions(s) / UC Diagnoses   Final diagnoses:  TMJ arthropathy    ED Discharge Orders        Ordered    baclofen (LIORESAL) 10 MG tablet  3 times daily     12/22/17 1336    naproxen (NAPROSYN) 500 MG tablet  2 times daily with meals     12/22/17 1336    HYDROcodone-acetaminophen (NORCO/VICODIN) 5-325 MG tablet  Every 4 hours PRN     12/22/17 1336       Controlled Substance Prescriptions [place] Controlled Substance Registry consulted? Registry reviewed with 1 opioid prescription this year for 10 pills   Lutricia Feil, [place] 12/22/17 1356

## 2017-12-22 NOTE — ED Triage Notes (Signed)
Patient started having severe right ear pain this am. No previous history of right ear pain.

## 2017-12-25 ENCOUNTER — Telehealth: Payer: Self-pay

## 2017-12-25 NOTE — Telephone Encounter (Signed)
Called to follow up with patient [facility]. Patient did not answer. No VM. Patient will call back with any questions or concerns. [facility]

## 2018-02-01 ENCOUNTER — Encounter: Payer: Self-pay | Admitting: Emergency Medicine

## 2018-02-01 ENCOUNTER — Ambulatory Visit
Admission: EM | Admit: 2018-02-01 | Discharge: 2018-02-01 | Disposition: A | Discharge status: Home / Self Care | Payer: BLUE CROSS/BLUE SHIELD | Attending: Family Medicine | Admitting: Family Medicine

## 2018-02-01 ENCOUNTER — Other Ambulatory Visit: Payer: Self-pay

## 2018-02-01 DIAGNOSIS — B9789 Other viral agents as the cause of diseases classified elsewhere: Secondary | ICD-10-CM | POA: Diagnosis not present

## 2018-02-01 DIAGNOSIS — R05 Cough: Secondary | ICD-10-CM | POA: Diagnosis not present

## 2018-02-01 DIAGNOSIS — J069 Acute upper respiratory infection, unspecified: Secondary | ICD-10-CM | POA: Diagnosis not present

## 2018-02-01 MED ORDER — HYDROCOD POLST-CPM POLST ER 10-8 MG/5ML PO SUER: 5.0000 mL | Freq: Two times a day (BID) | ORAL | 0 refills | Status: DC | PRN

## 2018-02-01 MED ORDER — FLUTICASONE PROPIONATE 50 MCG/ACT NA SUSP: 2.0000 | Freq: Every day | NASAL | 0 refills | Status: DC

## 2018-02-01 NOTE — ED Provider Notes (Signed)
[facility]    CSN: [id number] Arrival date & time: 02/01/18  1254  History   Chief Complaint Chief Complaint  Patient presents with  . Sinus Problem  . Nasal Congestion  . Cough   HPI  42 year old female presents with the above complaints.  Patient states that her symptoms started Friday.  She states that she has had cough, congestion, runny nose, sneezing, ear pain.  She reports that she has had an elevated temperature, T-max 99.8.  Patient states that she feels poorly.  She is been using Mucinex without improvement.  No reported sick contacts.  States that the pain is currently 5/10 in severity.  No other associated symptoms.  No other complaints at this time.  Past Medical History:  Diagnosis Date  . Migraines   . SVT (supraventricular tachycardia) (HCC)    Past Surgical History:  Procedure Laterality Date  . ABDOMINAL HYSTERECTOMY    . CARDIAC ELECTROPHYSIOLOGY MAPPING AND ABLATION    . CARDIAC SURGERY     ablasion  . WRIST SURGERY     OB History    No data available     Home Medications    Prior to Admission medications   Medication Sig Start Date End Date Taking? Authorizing Provider  chlorpheniramine-HYDROcodone (TUSSIONEX PENNKINETIC ER) 10-8 MG/5ML SUER Take 5 mLs by mouth every 12 (twelve) hours as needed. 02/01/18   Tommie Samsook, Deke Tilghman G, DO  fluticasone (FLONASE) 50 MCG/ACT nasal spray Place 2 sprays into both nostrils daily. 02/01/18   Tommie Samsook, Promise Bushong G, DO   Family History Family History  Problem Relation Age of Onset  . Hypertension Mother   . Stroke Mother   . Osteoarthritis Mother   . Anuerysm Father    Social History Social History   Tobacco Use  . Smoking status: Current Every Day Smoker    Packs/day: 0.50    Types: Cigarettes  . Smokeless tobacco: Never Used  Substance Use Topics  . Alcohol use: No  . Drug use: No   Allergies   Patient has no known allergies.   Review of Systems Review of Systems  Constitutional: Positive for  fever.  HENT: Positive for congestion, ear pain, rhinorrhea and sneezing.   Respiratory: Positive for cough.    Physical Exam Triage Vital Signs ED Triage Vitals  Enc Vitals Group     BP 02/01/18 1320 (!) 149/90     Pulse Rate 02/01/18 1320 78     Resp 02/01/18 1320 16     Temp 02/01/18 1320 98.2 F (36.8 C)     Temp Source 02/01/18 1320 Oral     SpO2 02/01/18 1320 100 %     Weight 02/01/18 1317 140 lb (63.5 kg)     Height 02/01/18 1317 5\' 4"  (1.626 m)     Head Circumference --      Peak Flow --      Pain Score 02/01/18 1317 3     Pain Loc --      Pain Edu? --      Excl. in GC? --    Updated Vital Signs BP (!) 149/90 (BP Location: Left Arm)   Pulse 78   Temp 98.2 F (36.8 C) (Oral)   Resp 16   Ht 5\' 4"  (1.626 m)   Wt 140 lb (63.5 kg)   SpO2 100%   BMI 24.03 kg/m   Physical Exam  Constitutional: She is oriented to person, place, and time. She appears well-developed and well-nourished. No distress.  HENT:  Head: Normocephalic and atraumatic.  Oropharynx with moderate erythema. No exudate. Nasal congestion noted.  Eyes: Conjunctivae are normal. Right eye exhibits no discharge. Left eye exhibits no discharge.  Cardiovascular: Normal rate and regular rhythm.  Pulmonary/Chest: Effort normal and breath sounds normal. She has no wheezes. She has no rales.  Neurological: She is alert and oriented to person, place, and time.  Psychiatric: She has a normal mood and affect. Her behavior is normal.  Nursing note and vitals reviewed.  UC Treatments / Results  Labs (all labs ordered are listed, but only abnormal results are displayed) Labs Reviewed - No data to display  EKG  EKG Interpretation None       Radiology No results found.  Procedures Procedures (including critical care time)  Medications Ordered in UC Medications - No data to display   Initial Impression / Assessment and Plan / UC Course  I have reviewed the triage vital signs and the nursing  notes.  Pertinent labs & imaging results that were available during my care of the patient were reviewed by me and considered in my medical decision making (see chart for details).     42 year old female presents with a viral respiratory infection.  Advised supportive care.  Flonase as directed.  Tussionex for cough.  Final Clinical Impressions(s) / UC Diagnoses   Final diagnoses:  Viral URI with cough    ED Discharge Orders        Ordered    chlorpheniramine-HYDROcodone (TUSSIONEX PENNKINETIC ER) 10-8 MG/5ML SUER  Every 12 hours PRN     02/01/18 1355    fluticasone (FLONASE) 50 MCG/ACT nasal spray  Daily     02/01/18 1355     Controlled Substance Prescriptions [place] Controlled Substance Registry consulted? Not Applicable   Tommie Sams, DO 02/01/18 1412

## 2018-02-01 NOTE — ED Triage Notes (Signed)
Patient c/o nasal congestion, sinus congestion and pressure and cough that started on Friday.  Patient denies fevers.

## 2018-02-01 NOTE — Discharge Instructions (Signed)
Rest, fluids.  Use the medications as prescribed. Nettipot will likely help as well.  Take care  Dr. Adriana Simasook

## 2019-01-09 ENCOUNTER — Other Ambulatory Visit: Payer: Self-pay

## 2019-01-09 ENCOUNTER — Emergency Department: Payer: BLUE CROSS/BLUE SHIELD

## 2019-01-09 ENCOUNTER — Emergency Department
Admission: EM | Admit: 2019-01-09 | Discharge: 2019-01-09 | Discharge status: Against Medical Advice | Payer: BLUE CROSS/BLUE SHIELD | Attending: Emergency Medicine | Admitting: Emergency Medicine

## 2019-01-09 DIAGNOSIS — R079 Chest pain, unspecified: Secondary | ICD-10-CM | POA: Diagnosis not present

## 2019-01-09 DIAGNOSIS — Z5321 Procedure and treatment not carried out due to patient leaving prior to being seen by health care provider: Secondary | ICD-10-CM | POA: Diagnosis not present

## 2019-01-09 LAB — CBC
HCT: 39.9 % (ref 36.0–46.0)
Hemoglobin: 13.3 g/dL (ref 12.0–15.0)
MCH: 31 pg (ref 26.0–34.0)
MCHC: 33.3 g/dL (ref 30.0–36.0)
MCV: 93 fL (ref 80.0–100.0)
PLATELETS: 358 10*3/uL (ref 150–400)
RBC: 4.29 MIL/uL (ref 3.87–5.11)
RDW: 12.4 % (ref 11.5–15.5)
WBC: 9.1 10*3/uL (ref 4.0–10.5)
nRBC: 0 % (ref 0.0–0.2)

## 2019-01-09 LAB — BASIC METABOLIC PANEL
Anion gap: 7 (ref 5–15)
BUN: 17 mg/dL (ref 6–20)
CALCIUM: 9.9 mg/dL (ref 8.9–10.3)
CO2: 30 mmol/L (ref 22–32)
CREATININE: 0.9 mg/dL (ref 0.44–1.00)
Chloride: 103 mmol/L (ref 98–111)
GFR calc Af Amer: >60 mL/min (ref >60)
GLUCOSE: 93 mg/dL (ref 70–99)
Potassium: 3.7 mmol/L (ref 3.5–5.1)
Sodium: 140 mmol/L (ref 135–145)

## 2019-01-09 LAB — TROPONIN I

## 2019-01-09 IMAGING — CR DG CHEST 2V
2 series · 2 of 2 positions shown · non-contrast
Comparison: None.

CLINICAL DATA: Chest pain

EXAM:
CHEST - 2 VIEW

[chest pa]
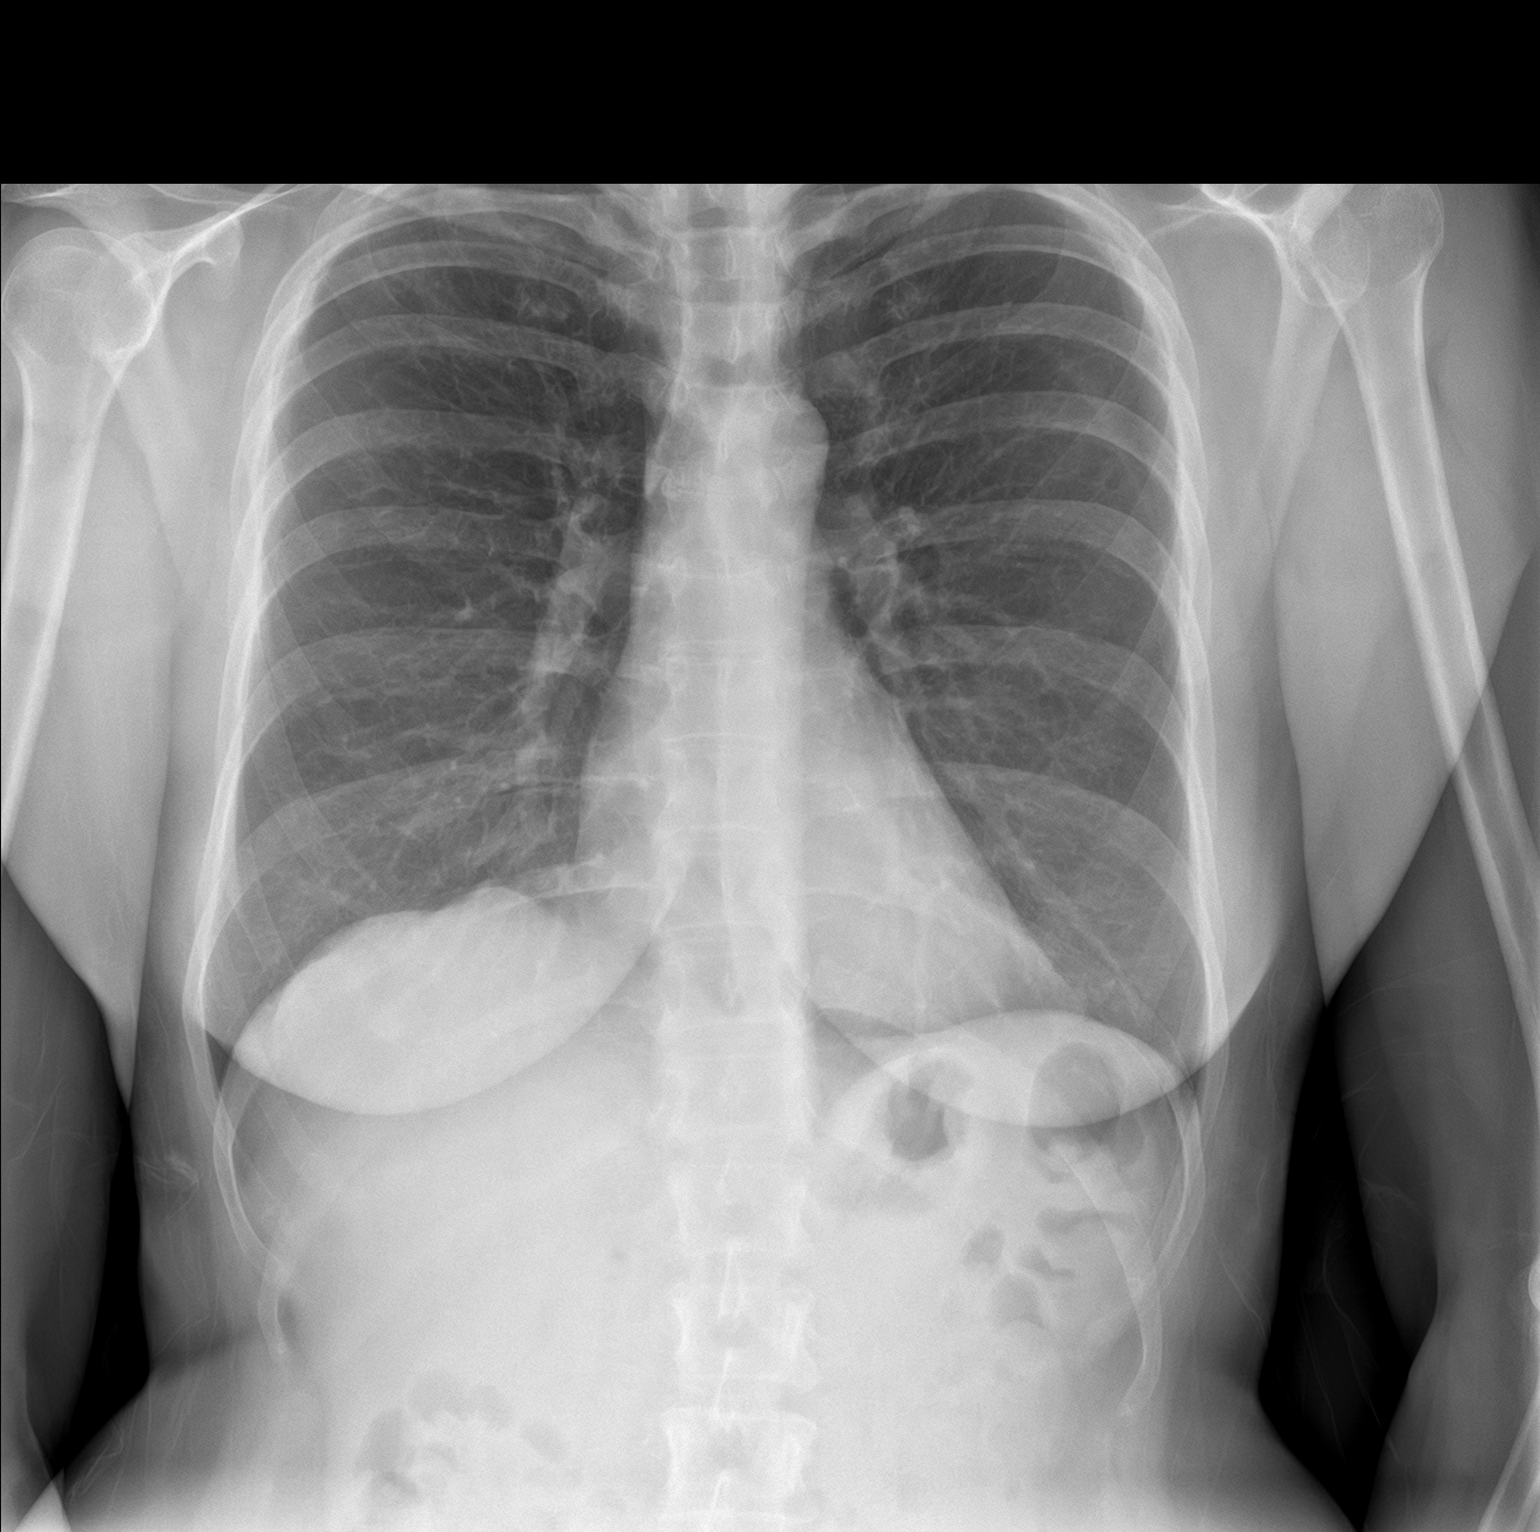

[chest lat]
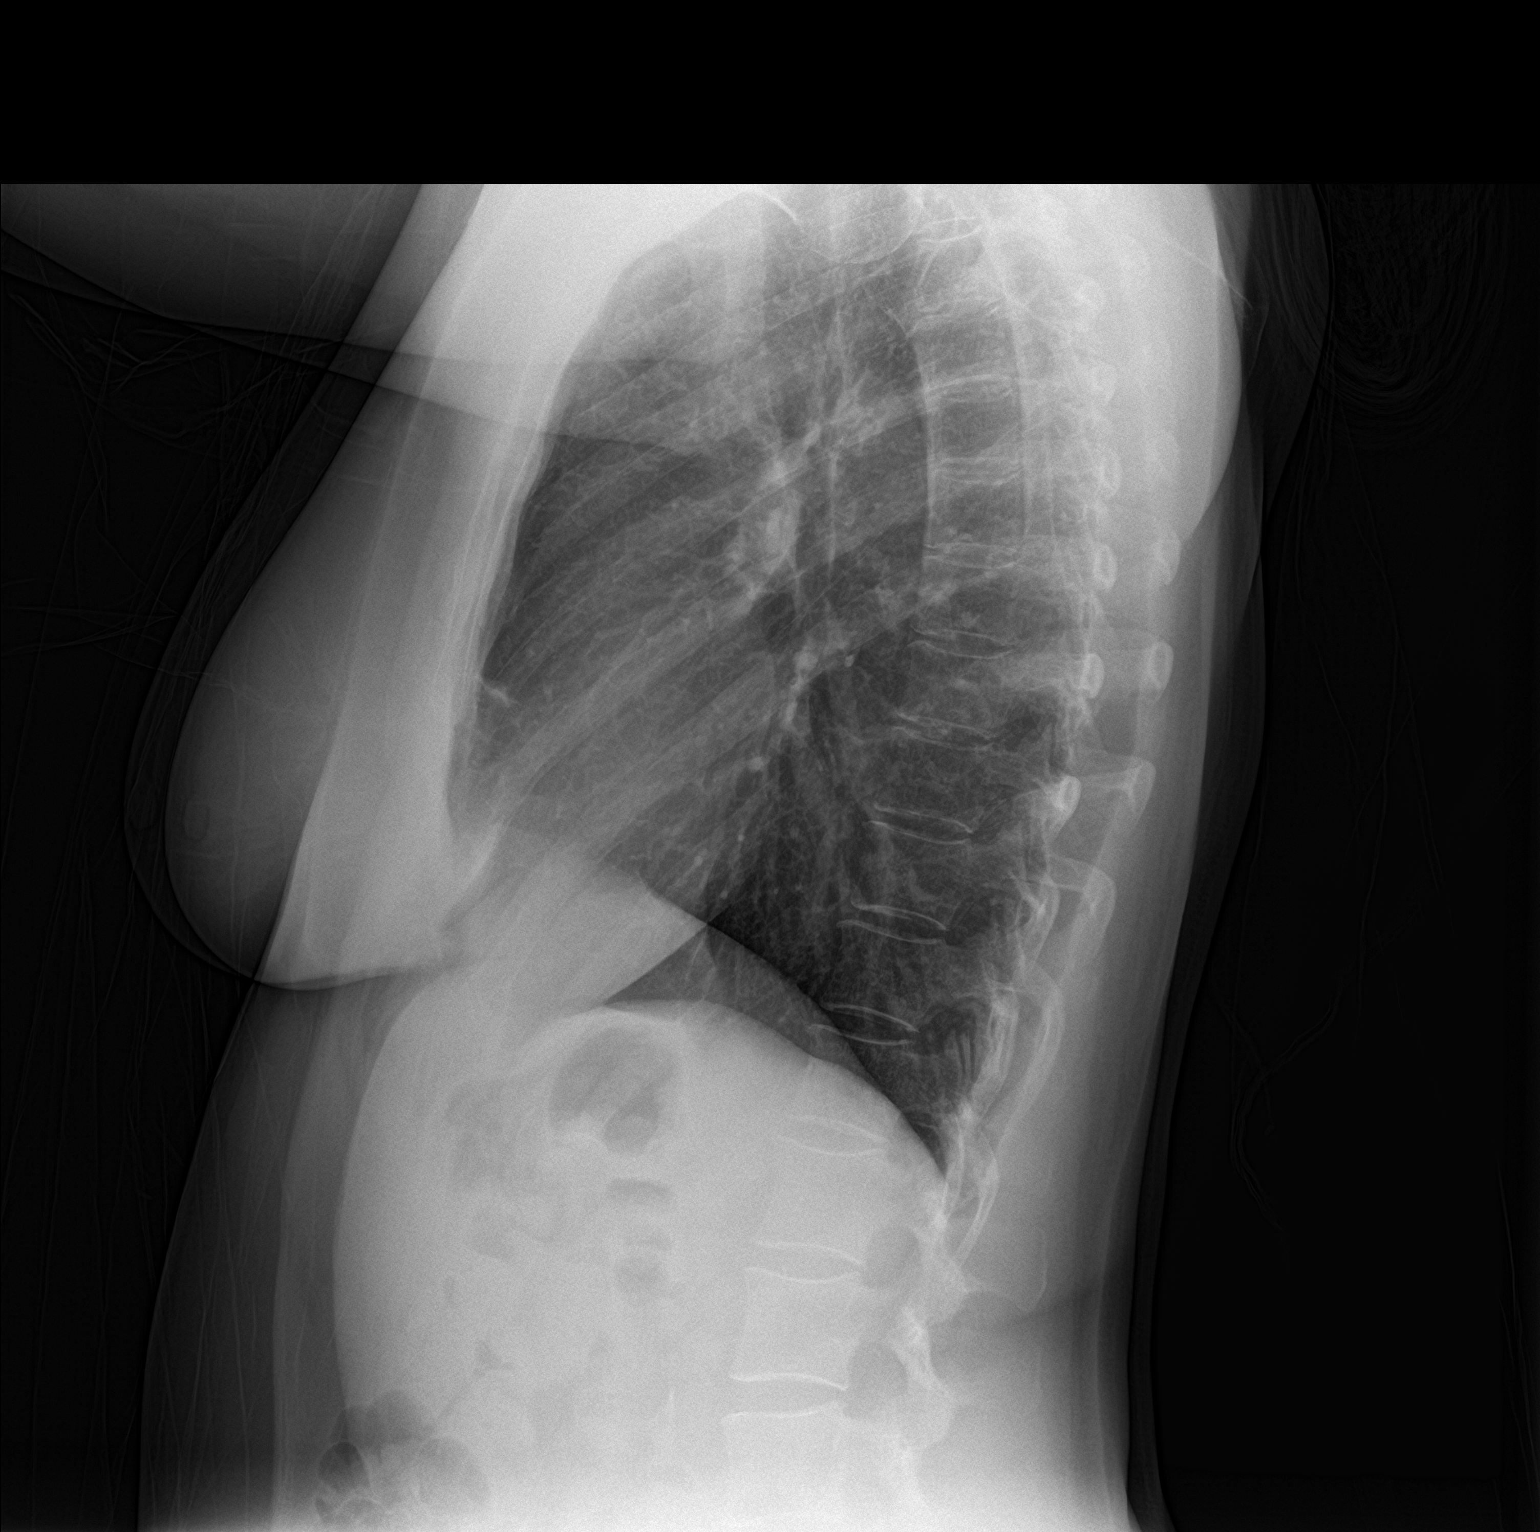

[2 of 2 positions shown; findings below may reference images not displayed]

FINDINGS: The heart size and mediastinal contours are within normal limits.
Both lungs are clear. The visualized skeletal structures are
unremarkable.
IMPRESSION: No active cardiopulmonary disease.

## 2019-01-09 MED ORDER — SODIUM CHLORIDE 0.9% FLUSH: 3.0000 mL | Freq: Once | INTRAVENOUS | Status: DC

## 2019-01-09 NOTE — ED Triage Notes (Signed)
Pt to the er for sudden onset chest pain. Pt states she stood up and had a sudden chest pain under the left breast at 1600. Pt states this has been going on for years. Pt states she had a cardiac ablation in 1998 or 1996. Pt does not have a cardiologist and is not on any medications. Pt states she blacked out when she stood up and fell in the floor. Pt states again at 2000 she stood up again, got dizzy, had chest pain and almost blacked out again.

## 2019-01-10 ENCOUNTER — Telehealth: Payer: Self-pay | Admitting: Emergency Medicine

## 2019-01-10 NOTE — Telephone Encounter (Signed)
Called patient due to lwot to inquire about condition and follow up plans.  I left message.

## 2019-07-20 ENCOUNTER — Encounter: Payer: Self-pay | Admitting: Emergency Medicine

## 2019-07-20 ENCOUNTER — Emergency Department
Admission: EM | Admit: 2019-07-20 | Discharge: 2019-07-20 | Disposition: A | Discharge status: Home / Self Care | Payer: BC Managed Care – PPO | Attending: Emergency Medicine | Admitting: Emergency Medicine

## 2019-07-20 ENCOUNTER — Other Ambulatory Visit: Payer: Self-pay

## 2019-07-20 DIAGNOSIS — Y929 Unspecified place or not applicable: Secondary | ICD-10-CM | POA: Insufficient documentation

## 2019-07-20 DIAGNOSIS — Y9389 Activity, other specified: Secondary | ICD-10-CM | POA: Diagnosis not present

## 2019-07-20 DIAGNOSIS — S39012A Strain of muscle, fascia and tendon of lower back, initial encounter: Secondary | ICD-10-CM

## 2019-07-20 DIAGNOSIS — Z87891 Personal history of nicotine dependence: Secondary | ICD-10-CM | POA: Diagnosis not present

## 2019-07-20 DIAGNOSIS — Y999 Unspecified external cause status: Secondary | ICD-10-CM | POA: Diagnosis not present

## 2019-07-20 DIAGNOSIS — X500XXA Overexertion from strenuous movement or load, initial encounter: Secondary | ICD-10-CM | POA: Diagnosis not present

## 2019-07-20 DIAGNOSIS — Z79899 Other long term (current) drug therapy: Secondary | ICD-10-CM | POA: Insufficient documentation

## 2019-07-20 DIAGNOSIS — M5416 Radiculopathy, lumbar region: Secondary | ICD-10-CM | POA: Insufficient documentation

## 2019-07-20 DIAGNOSIS — S3992XA Unspecified injury of lower back, initial encounter: Secondary | ICD-10-CM | POA: Diagnosis present

## 2019-07-20 MED ORDER — KETOROLAC TROMETHAMINE 10 MG PO TABS: 10.0000 mg | ORAL_TABLET | Freq: Three times a day (TID) | ORAL | 0 refills | Status: DC

## 2019-07-20 MED ORDER — ORPHENADRINE CITRATE 30 MG/ML IJ SOLN
60.0000 mg | INTRAMUSCULAR | Status: AC
  Administered 2019-07-20: 60 mg via INTRAMUSCULAR
  Filled 2019-07-20: qty 2

## 2019-07-20 MED ORDER — HYDROCODONE-ACETAMINOPHEN 5-325 MG PO TABS: 1.0000 | ORAL_TABLET | Freq: Three times a day (TID) | ORAL | 0 refills | Status: AC | PRN

## 2019-07-20 MED ORDER — KETOROLAC TROMETHAMINE 30 MG/ML IJ SOLN
30.0000 mg | Freq: Once | INTRAMUSCULAR | Status: AC
  Administered 2019-07-20: 30 mg via INTRAMUSCULAR
  Filled 2019-07-20: qty 1

## 2019-07-20 MED ORDER — CYCLOBENZAPRINE HCL 5 MG PO TABS: 5.0000 mg | ORAL_TABLET | Freq: Three times a day (TID) | ORAL | 0 refills | Status: DC | PRN

## 2019-07-20 NOTE — ED Notes (Signed)
Pt to the er for pain in the lower back on the left side. Pt says she was picking up a 32 pack case of water and went to put it in the car and extended her arms. Pt reports left side low back pain that is shooting up into her spine and down the back of her left leg.

## 2019-07-20 NOTE — ED Triage Notes (Signed)
Pt has disc problems in lower back at baseline. Hurt back lifting case of water yesterday.  Has been having pain to lower back since radiating down left leg.  No loss bowel or bladder but has urinated on self because was unable to get to bathroom in time r/t pain.  No fever.  Pt reports cannot get comfortable.

## 2019-07-20 NOTE — ED Provider Notes (Signed)
[facility] Emergency Department Provider Note ____________________________________________  Time seen: 2156  I have reviewed the triage vital signs and the nursing notes.  HISTORY  Chief Complaint  Back Pain  HPI Sandra Doyle is a 43 y.o. female presents to the ED for evaluation of acute low back pain with left lower extremity referral. 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 She describes intermittent LBP, but takes no prescription meds and has no current care provider. She reports onset yesterday, after lifting a case of water into the trunk. Since then she has been having back pain with LLE referral. She denies any incontinence, foot drop, saddle anesthesias, or distal paresthesias. She has been taking Tylenol with limited benefit. She had seen a chiropractor on Monday, for adjustment, and reports a good response.   Past Medical History:  Diagnosis Date  . Migraines   . SVT (supraventricular tachycardia) (HCC)     There are no active problems to display for this patient.   Past Surgical History:  Procedure Laterality Date  . ABDOMINAL HYSTERECTOMY    . CARDIAC ELECTROPHYSIOLOGY MAPPING AND ABLATION    . CARDIAC SURGERY     ablasion  . WRIST SURGERY      Prior to Admission medications   Medication Sig Start Date End Date Taking? Authorizing Provider  chlorpheniramine-HYDROcodone (TUSSIONEX PENNKINETIC ER) 10-8 MG/5ML SUER Take 5 mLs by mouth every 12 (twelve) hours as needed. 02/01/18   Tommie Samsook, Jayce G, DO  fluticasone (FLONASE) 50 MCG/ACT nasal spray Place 2 sprays into both nostrils daily. 02/01/18   Tommie Samsook, Jayce G, DO    Allergies Patient has no known allergies.  Family History  Problem Relation Age of Onset  . Hypertension Mother   . Stroke Mother   . Osteoarthritis Mother   . Anuerysm Father     Social History Social History   Tobacco Use  . Smoking status: Former Smoker    Packs/day: 0.50    Types: Cigarettes  .  Smokeless tobacco: Never Used  Substance Use Topics  . Alcohol use: Yes    Comment: occasionally  . Drug use: No    Review of Systems  Constitutional: Negative for fever. Eyes: Negative for visual changes. ENT: Negative for sore throat. Cardiovascular: Negative for chest pain. Respiratory: Negative for shortness of breath. Gastrointestinal: Negative for abdominal pain, vomiting and diarrhea. Genitourinary: Negative for dysuria. Musculoskeletal: Positive for back pain. Skin: Negative for rash. Neurological: Negative for headaches, focal weakness or numbness. ____________________________________________  PHYSICAL EXAM:  VITAL SIGNS: ED Triage Vitals  Enc Vitals Group     BP 07/20/19 2051 (!) 150/87     Pulse Rate 07/20/19 2051 85     Resp 07/20/19 2051 18     Temp 07/20/19 2051 98.5 F (36.9 C)     Temp Source 07/20/19 2051 Oral     SpO2 07/20/19 2051 100 %     Weight 07/20/19 2047 150 lb (68 kg)     Height 07/20/19 2047 5\' 4"  (1.626 m)     Head Circumference --      Peak Flow --      Pain Score 07/20/19 2047 10     Pain Loc --      Pain Edu? --      Excl. in GC? --     Constitutional: Alert and oriented. Well appearing and in no distress. Head: Normocephalic and atraumatic. Eyes: Conjunctivae are normal. Normal extraocular movements Cardiovascular: Normal rate, regular rhythm.  Normal distal pulses. Respiratory: Normal respiratory effort. No wheezes/rales/rhonchi. Gastrointestinal: Soft and nontender. No distention. Musculoskeletal: Normal spinal alignment without midline tenderness, spasm, deformity, or step-off.  Patient with normal hip flexion and extension range.  Normal resistance testing to the lower extremities appreciated.  Nontender with normal range of motion in all extremities.  Neurologic: Cranial nerves II through XII grossly intact.  Normal LE DTRs bilaterally.  Normal toe dorsiflexion foot eversion noted.  Negative supine straight leg raise bilaterally.   Normal speech and language. No gross focal neurologic deficits are appreciated. Skin:  Skin is warm, dry and intact. No rash noted. ____________________________________________  PROCEDURES  Procedures Toradol 30 mg IM Norflex 60 mg IM ____________________________________________  INITIAL IMPRESSION / ASSESSMENT AND PLAN / ED COURSE  Sandra Doyle was evaluated in Emergency Department on 07/20/2019 for the symptoms described in the history of present illness. She was evaluated in the context of the global COVID-19 pandemic, which necessitated consideration that the patient might be at risk for infection with the SARS-CoV-2 virus that causes COVID-19. Institutional protocols and algorithms that pertain to the evaluation of patients at risk for COVID-19 are in a state of rapid change based on information released by regulatory bodies including the CDC and federal and state organizations. These policies and algorithms were followed during the patient's care in the ED.  Patient with ED evaluation of acute low back pain with low extremity referral after a lifting injury.  Patient's exam is consistent with a likely lumbar strain with radiculopathy.  Her x-rays from 2017 reviewed, and she does have significant L4-5 and L5-S1 DDD and osteophytes.  Symptoms are consistent with a likely strain to the low back without any acute neuromuscular deficit.  Patient treated in the ED with anti-inflammatories, muscle relaxants, and pain medicines.  She is discharged to home with the same.  A work note is provided for 2 days as requested.  She will follow-up with Ortho for ongoing symptoms or return to the ED as discussed.  I reviewed the patient's prescription history over the last 12 months in the multi-state controlled substances database(s) that includes [place], [place], [place], [place], [place], [place], [place], [place], [place], [place], [place], [place], [place], and [place].  Results were notable for no current prescriptions.  ____________________________________________  FINAL CLINICAL IMPRESSION(S) / ED DIAGNOSES  Final diagnoses:  Strain of lumbar region, initial encounter  Lumbar radiculopathy      Carmie End, Dannielle Karvonen, PA-C 07/20/19 2254    Vanessa Shumway, MD 07/22/19 [phone]

## 2019-07-20 NOTE — Discharge Instructions (Signed)
Your exam is consistent with lumbar strain and radiculopathy. Your previous XR reveals severe disk disease and bone spurs to the lumbar spine. You should take the prescription meds as directed. Follow-up with Ortho for ongoing symptoms. Consider restarting care with a spine specialist or pain management specialist for spinal injections.

## 2019-07-29 ENCOUNTER — Other Ambulatory Visit: Payer: Self-pay | Admitting: Student

## 2019-07-29 DIAGNOSIS — M5416 Radiculopathy, lumbar region: Secondary | ICD-10-CM

## 2019-08-07 ENCOUNTER — Ambulatory Visit
Admission: RE | Admit: 2019-08-07 | Discharge: 2019-08-07 | Disposition: A | Discharge status: Home / Self Care | Payer: BC Managed Care – PPO | Source: Ambulatory Visit | Attending: Student | Admitting: Student

## 2019-08-07 DIAGNOSIS — M5416 Radiculopathy, lumbar region: Secondary | ICD-10-CM | POA: Diagnosis present

## 2019-08-07 IMAGING — MR MRI LUMBAR SPINE WITHOUT CONTRAST
5 series · 32 of 48 positions shown · non-contrast
Comparison: Lumbar radiographs [DATE].
Report of lumbar radiographs [DATE] (no images available).

CLINICAL DATA: 42-year-old female with central and left side low
back pain radiating to the left hip, buttock, and the bilateral
legs.

EXAM:
MRI LUMBAR SPINE WITHOUT CONTRAST
TECHNIQUE: Multiplanar, multisequence MR imaging of the lumbar spine was
performed. No intravenous contrast was administered.

[Series 5: T2 · sagittal · 4.0mm · 0.81mm/px · 5 of 13 slices shown (1 of 2)]
[im 1/13]
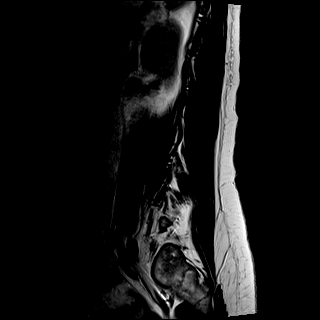
[im 4/13]
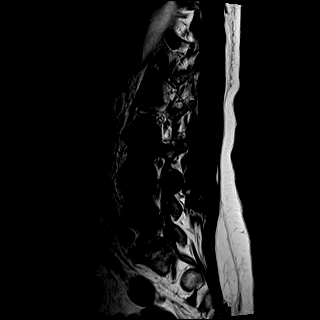
[im 7/13]
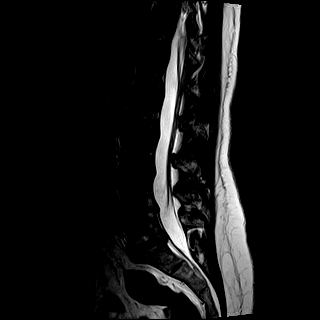
[im 10/13]
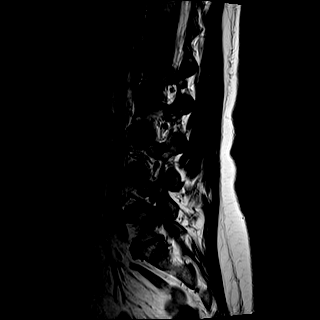
[im 13/13]
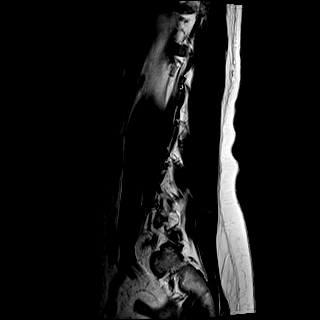

[Series 6: T1 · sagittal · 4.0mm · 1.02mm/px · 5 of 13 slices shown (1 of 2)]
[im 1/13]
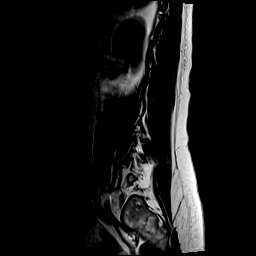
[im 4/13]
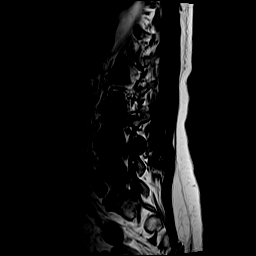
[im 7/13]
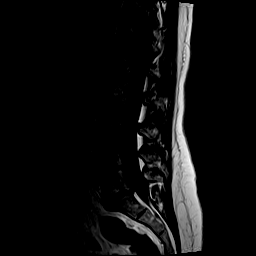
[im 10/13]
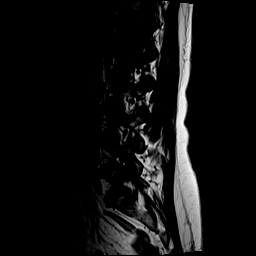
[im 13/13]
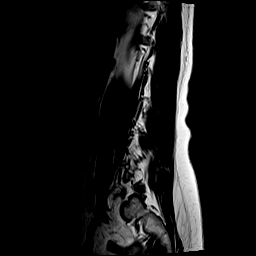

[Series 7: STIR · sagittal · 4.0mm · 0.51mm/px · 2 of 13 slices shown]
[im 1/13]
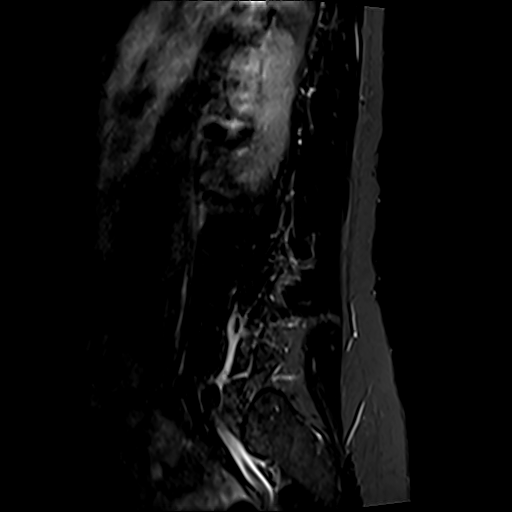
[im 3/13]
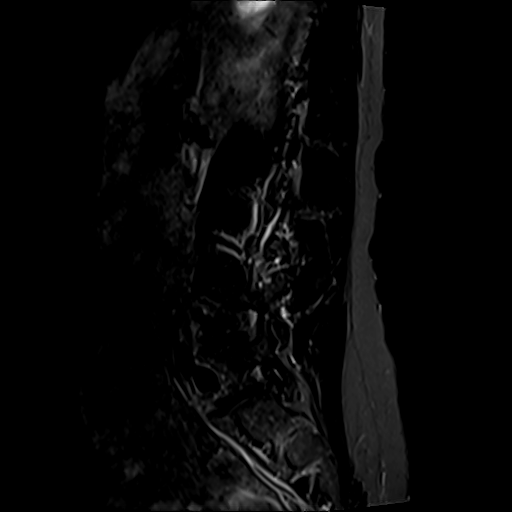

[Series 8: T2 · axial · 4.0mm · 0.78mm/px · z∈[-74,+106]mm · 10 of 36 slices shown (2 of 2)]
[im 3/36]
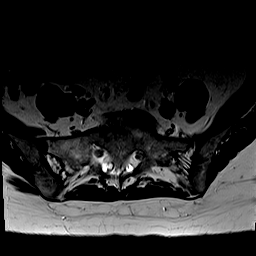
[im 5/36]
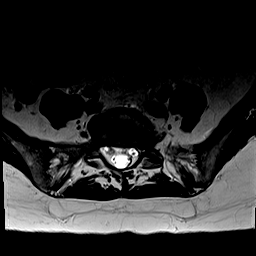
[im 8/36]
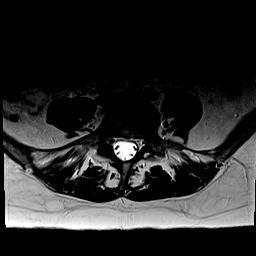
[im 12/36]
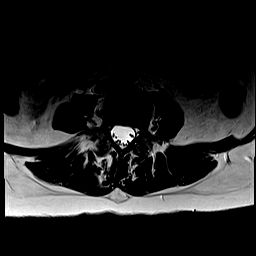
[im 17/36]
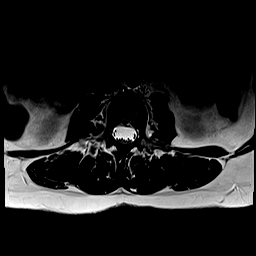
[im 19/36]
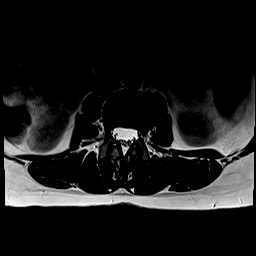
[im 22/36]
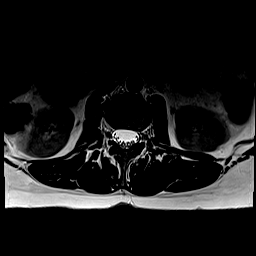
[im 26/36]
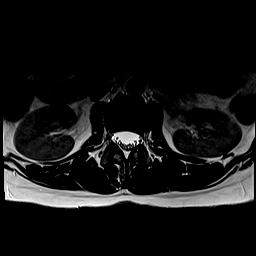
[im 31/36]
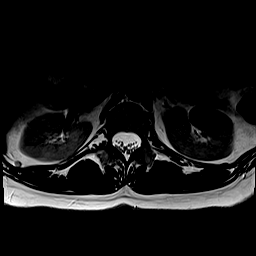
[im 36/36]
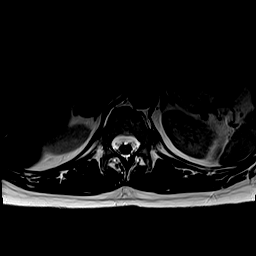

[Series 9: T1 · axial · 4.0mm · 0.39mm/px · z∈[-74,+106]mm · 10 of 36 slices shown (2 of 2)]
[im 3/36]
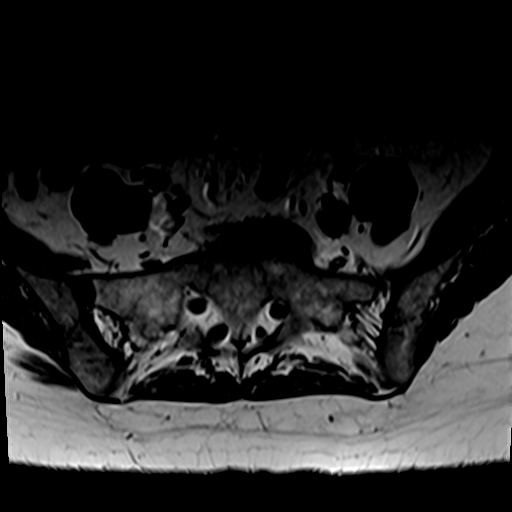
[im 5/36]
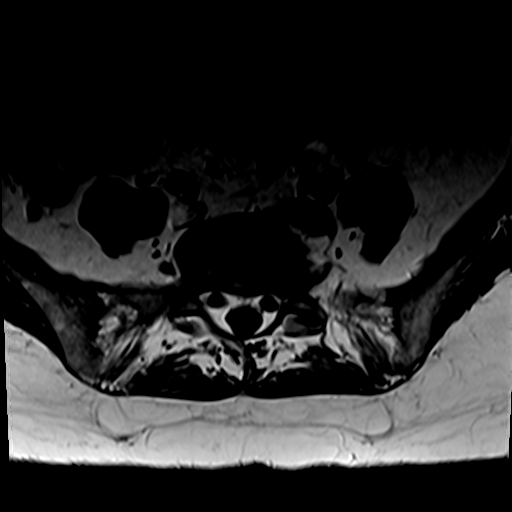
[im 8/36]
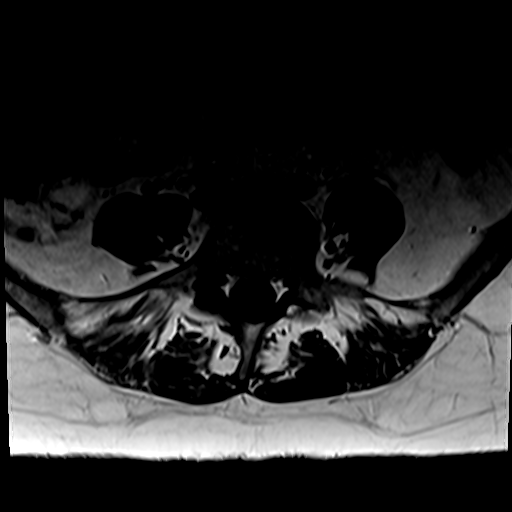
[im 12/36]
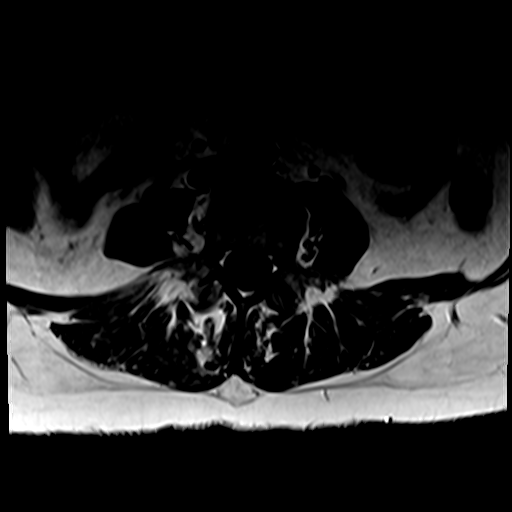
[im 17/36]
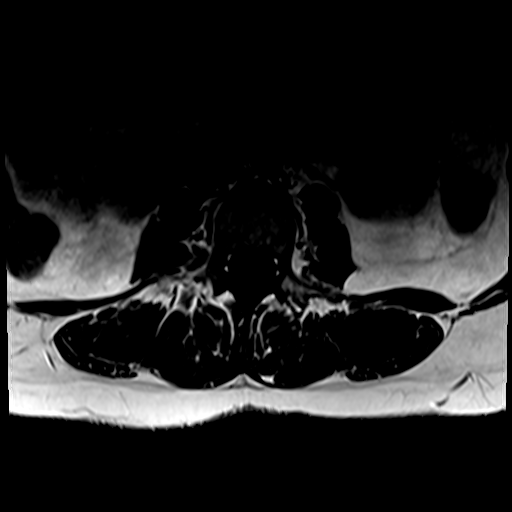
[im 19/36]
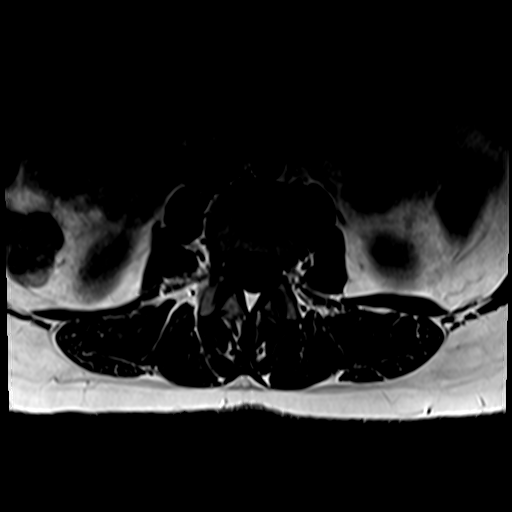
[im 22/36]
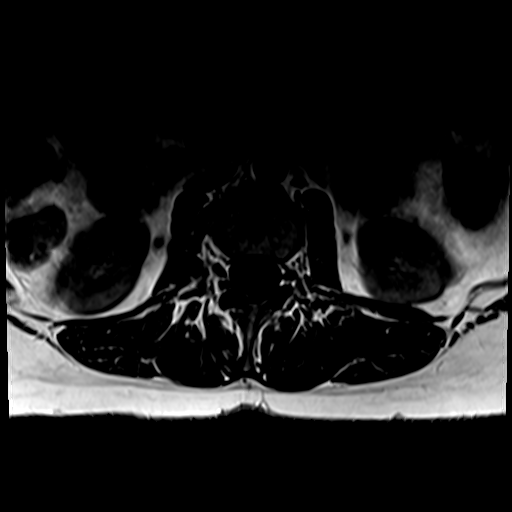
[im 26/36]
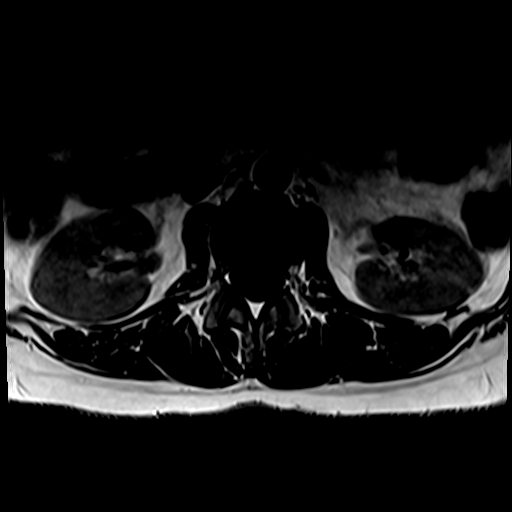
[im 31/36]
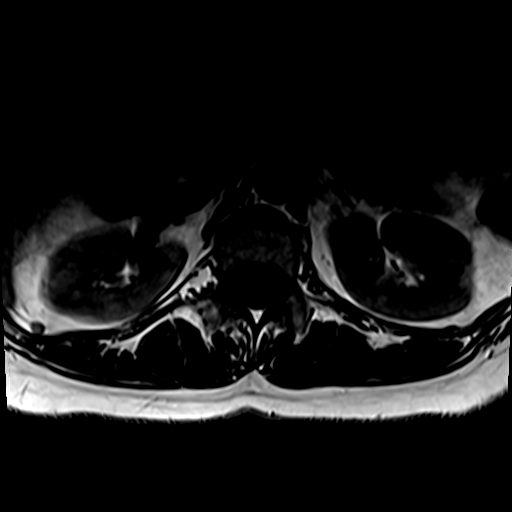
[im 36/36]
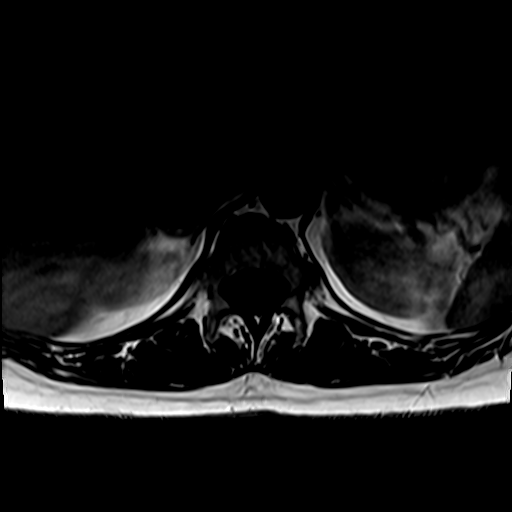

[32 of 48 positions shown; findings below may reference images not displayed]

FINDINGS: Segmentation:  Normal.

Alignment: Mild chronic levoconvex lumbar scoliosis. Stable lumbar
lordosis since [DM]. Subtle retrolisthesis of L5 on S1.

Vertebrae: Chronic degenerative endplate signal changes at L5-S1,
mostly far lateral. Background bone marrow signal appears normal. No
marrow edema or evidence of acute osseous abnormality. Intact
visible sacrum and SI joints.

Conus medullaris and cauda equina: Conus extends to the T12-L1
level. No lower spinal cord or conus signal abnormality.

Paraspinal and other soft tissues: Negative.

Disc levels:

T11-T12: Mild facet hypertrophy on the left.

T12-L1:  Negative.

L1-L2:  Negative.

L2-L3:  Negative.

L3-L4: Minimal disc bulge and posterior element hypertrophy. No
convincing stenosis.

L4-L5: Disc space loss. Circumferential disc bulge and endplate
spurring eccentric to the right. Mild posterior element hypertrophy.
No spinal or convincing lateral recess stenosis. Moderate right L4
foraminal stenosis (series 5, image 4).

L5-S1: Disc space loss. Circumferential disc bulge and endplate
spurring. Mild-to-moderate left side facet hypertrophy. No spinal or
lateral recess stenosis. Moderate left L5 foraminal stenosis (series
5, image 11). Borderline to mild right L5 foraminal stenosis.
IMPRESSION: 1. Chronic disc and endplate degeneration at L4-L5 and L5-S1. No
spinal or convincing lateral recess stenosis, although there is
moderate foraminal stenosis at the right L4 and left L5 nerve
levels.
2. Mild lumbar spine degeneration elsewhere. Underlying mild chronic
levoconvex scoliosis.

## 2019-08-17 ENCOUNTER — Other Ambulatory Visit: Payer: Self-pay

## 2019-08-17 ENCOUNTER — Encounter: Payer: Self-pay | Admitting: Emergency Medicine

## 2019-08-17 ENCOUNTER — Emergency Department: Payer: BC Managed Care – PPO

## 2019-08-17 ENCOUNTER — Emergency Department
Admission: EM | Admit: 2019-08-17 | Discharge: 2019-08-17 | Disposition: A | Discharge status: Home / Self Care | Payer: BC Managed Care – PPO | Attending: Emergency Medicine | Admitting: Emergency Medicine

## 2019-08-17 DIAGNOSIS — Z87891 Personal history of nicotine dependence: Secondary | ICD-10-CM | POA: Insufficient documentation

## 2019-08-17 DIAGNOSIS — M5442 Lumbago with sciatica, left side: Secondary | ICD-10-CM | POA: Insufficient documentation

## 2019-08-17 DIAGNOSIS — Z79899 Other long term (current) drug therapy: Secondary | ICD-10-CM | POA: Insufficient documentation

## 2019-08-17 DIAGNOSIS — M545 Low back pain: Secondary | ICD-10-CM | POA: Diagnosis present

## 2019-08-17 DIAGNOSIS — G8929 Other chronic pain: Secondary | ICD-10-CM | POA: Insufficient documentation

## 2019-08-17 LAB — COMPREHENSIVE METABOLIC PANEL
ALT: 21 U/L (ref 0–44)
AST: 22 U/L (ref 15–41)
Albumin: 4.2 g/dL (ref 3.5–5.0)
Alkaline Phosphatase: 88 U/L (ref 38–126)
Anion gap: 10 (ref 5–15)
BUN: 16 mg/dL (ref 6–20)
CO2: 26 mmol/L (ref 22–32)
Calcium: 10.2 mg/dL (ref 8.9–10.3)
Chloride: 107 mmol/L (ref 98–111)
Creatinine, Ser: 0.98 mg/dL (ref 0.44–1.00)
GFR calc Af Amer: >60 mL/min (ref >60)
GFR calc non Af Amer: >60 mL/min (ref >60)
Glucose, Bld: 95 mg/dL (ref 70–99)
Potassium: 3.2 mmol/L — ABNORMAL LOW (ref 3.5–5.1)
Sodium: 143 mmol/L (ref 135–145)
Total Bilirubin: 0.4 mg/dL (ref 0.3–1.2)
Total Protein: 8.1 g/dL (ref 6.5–8.1)

## 2019-08-17 LAB — URINALYSIS, COMPLETE (UACMP) WITH MICROSCOPIC
Bacteria, UA: NONE SEEN
Bilirubin Urine: NEGATIVE
Glucose, UA: NEGATIVE mg/dL
Ketones, ur: NEGATIVE mg/dL
Leukocytes,Ua: NEGATIVE
Nitrite: NEGATIVE
Protein, ur: NEGATIVE mg/dL
Specific Gravity, Urine: 1.016 (ref 1.005–1.030)
pH: 7 (ref 5.0–8.0)

## 2019-08-17 LAB — CBC WITH DIFFERENTIAL/PLATELET
Abs Immature Granulocytes: 0.05 10*3/uL (ref 0.00–0.07)
Basophils Absolute: 0.1 10*3/uL (ref 0.0–0.1)
Basophils Relative: 1 %
Eosinophils Absolute: 0 10*3/uL (ref 0.0–0.5)
Eosinophils Relative: 0 %
HCT: 38.4 % (ref 36.0–46.0)
Hemoglobin: 13.1 g/dL (ref 12.0–15.0)
Immature Granulocytes: 1 %
Lymphocytes Relative: 19 %
Lymphs Abs: 1.9 10*3/uL (ref 0.7–4.0)
MCH: 30.8 pg (ref 26.0–34.0)
MCHC: 34.1 g/dL (ref 30.0–36.0)
MCV: 90.4 fL (ref 80.0–100.0)
Monocytes Absolute: 1 10*3/uL (ref 0.1–1.0)
Monocytes Relative: 10 %
Neutro Abs: 7 10*3/uL (ref 1.7–7.7)
Neutrophils Relative %: 69 %
Platelets: 337 10*3/uL (ref 150–400)
RBC: 4.25 MIL/uL (ref 3.87–5.11)
RDW: 12.1 % (ref 11.5–15.5)
WBC: 9.9 10*3/uL (ref 4.0–10.5)
nRBC: 0 % (ref 0.0–0.2)

## 2019-08-17 IMAGING — CT CT L SPINE W/O CM
3 series · 13 of 33 positions shown, 16 images · non-contrast
Comparison: MRI lumbar spine [DATE].

CLINICAL DATA: Chronic low back pain. Left leg numbness. Possible
seizure today. No known injury.

EXAM:
CT LUMBAR SPINE WITHOUT CONTRAST
TECHNIQUE: Multidetector CT imaging of the lumbar spine was performed without
intravenous contrast administration. Multiplanar CT image
reconstructions were also generated.

[Series 5: l spine soft · axial · 0.31mm/px · z∈[-291,-139]mm · 5 of 110 slices shown, 7 images]
[im 17/110  soft-tissue]
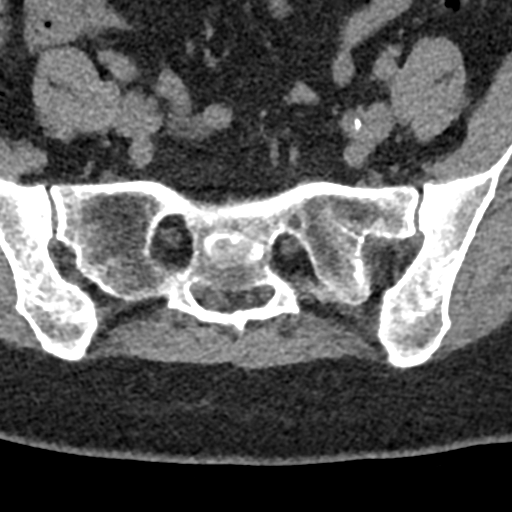
[im 17/110  bone]
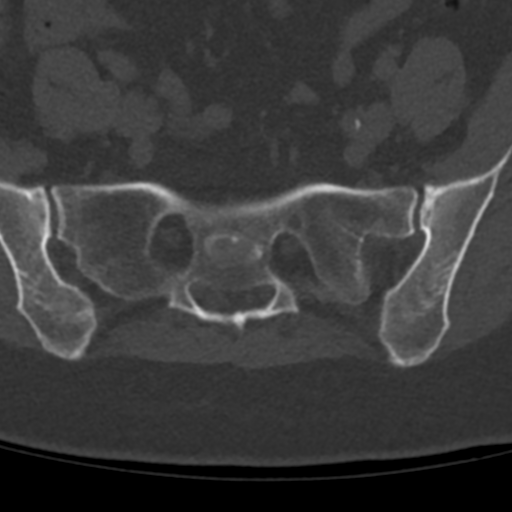
[im 34/110  bone]
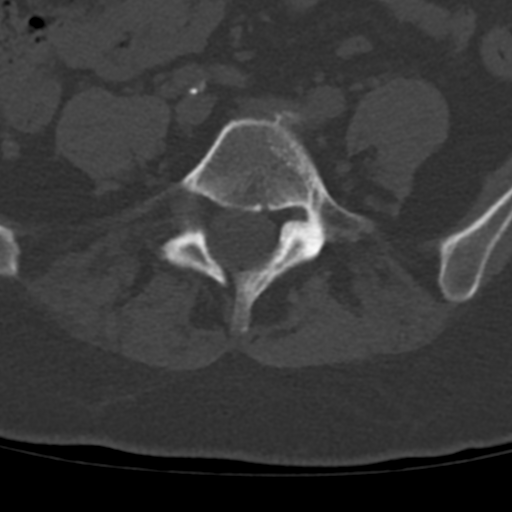
[im 59/110  bone]
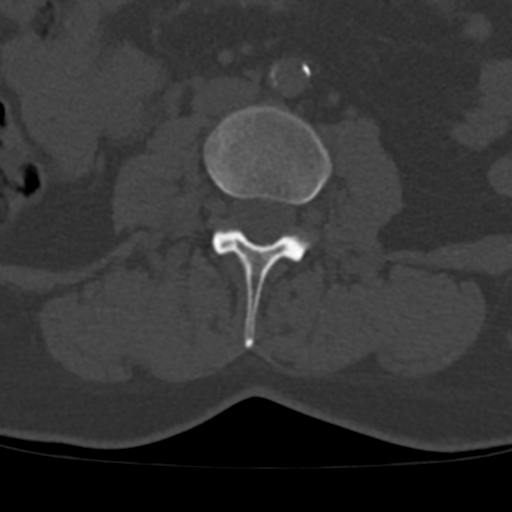
[im 76/110  bone]
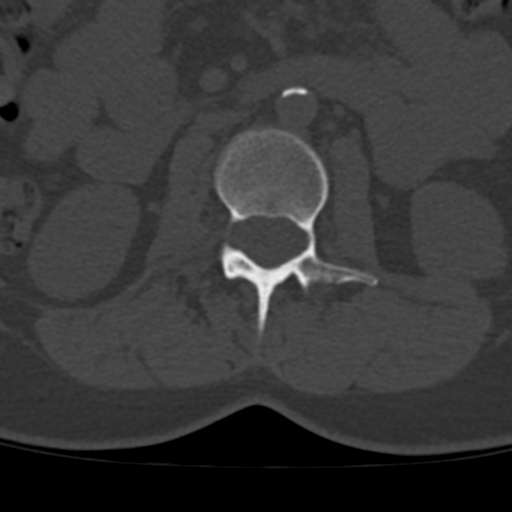
[im 93/110  soft-tissue]
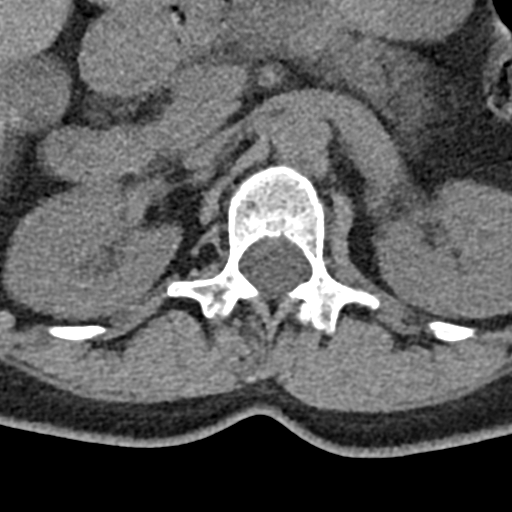
[im 93/110  bone]
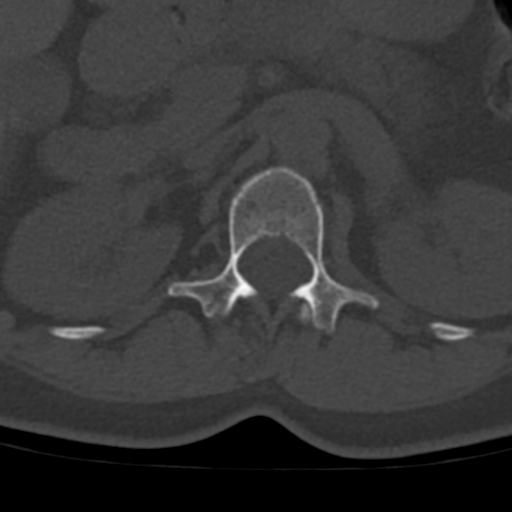

[Series 6: sagittal bone · sagittal · 0.29mm/px · 5 of 76 slices shown, 6 images]
[im 26/76  bone]
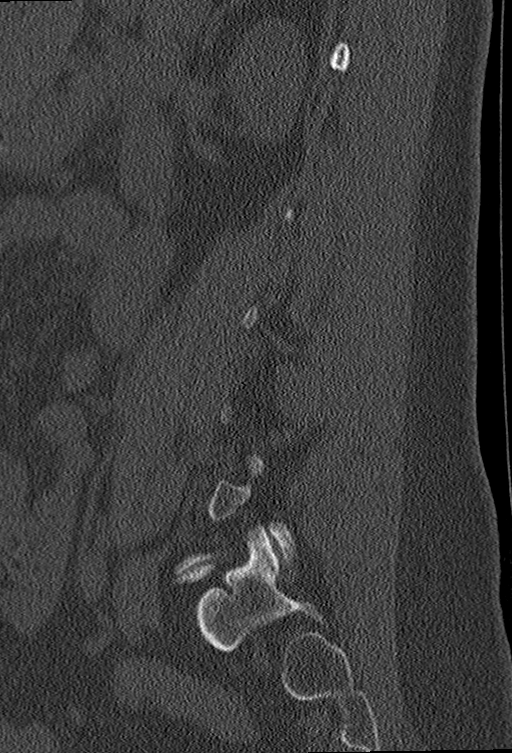
[im 32/76  bone]
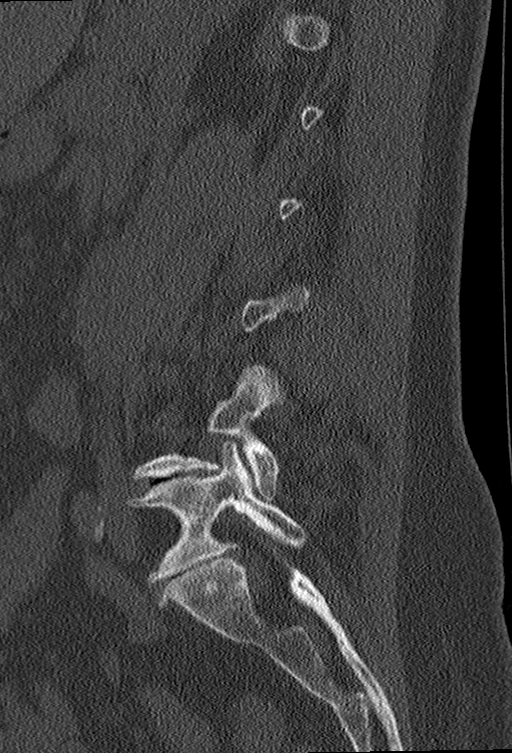
[im 38/76  soft-tissue]
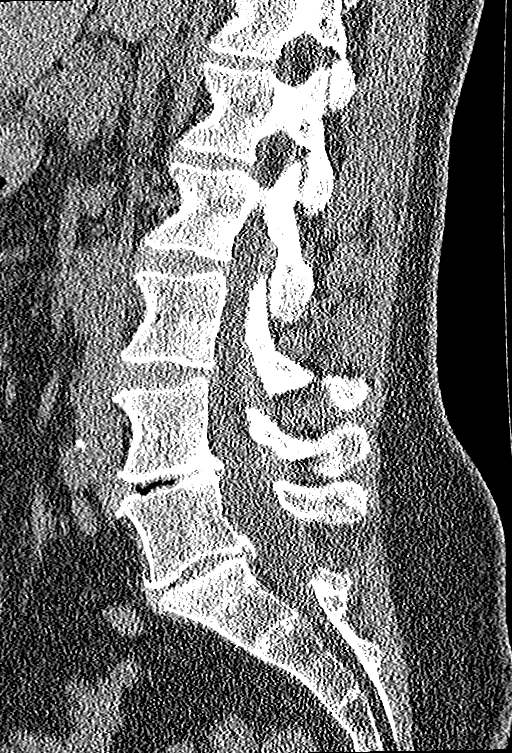
[im 38/76  bone]
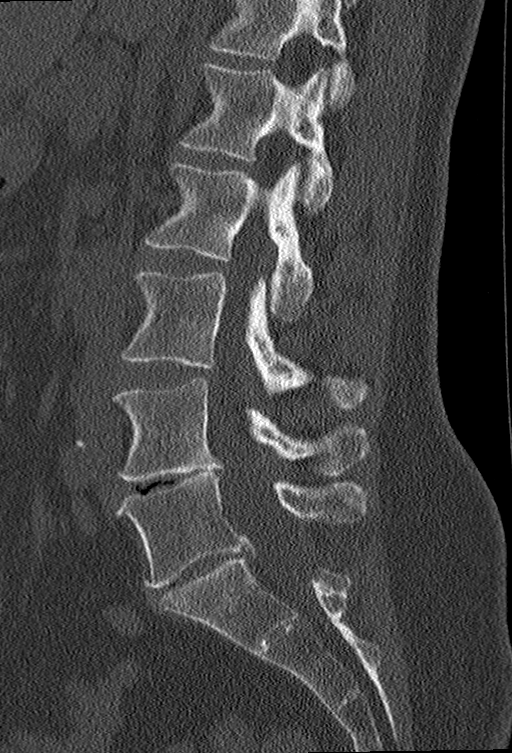
[im 44/76  bone]
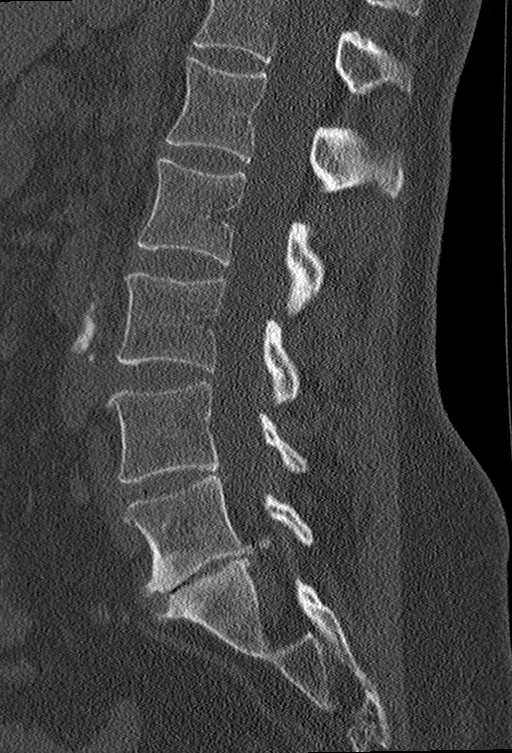
[im 51/76  bone]
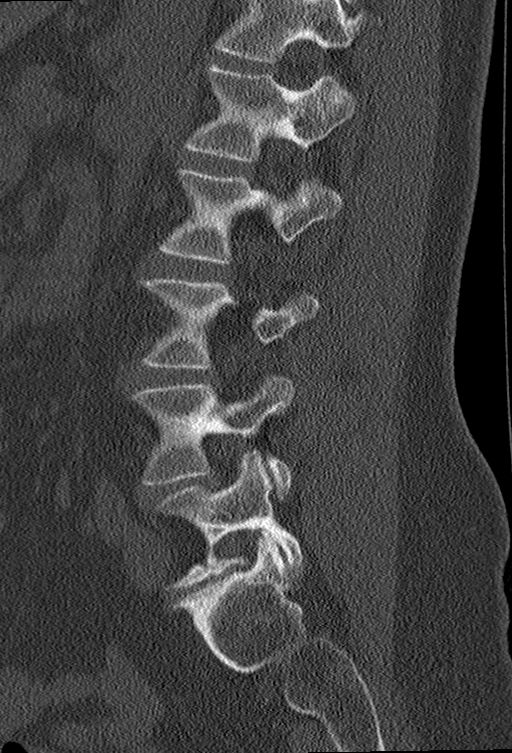

[Series 7: coronal bone · coronal · 0.32mm/px · 3 of 61 slices shown]
[im 13/61  bone]
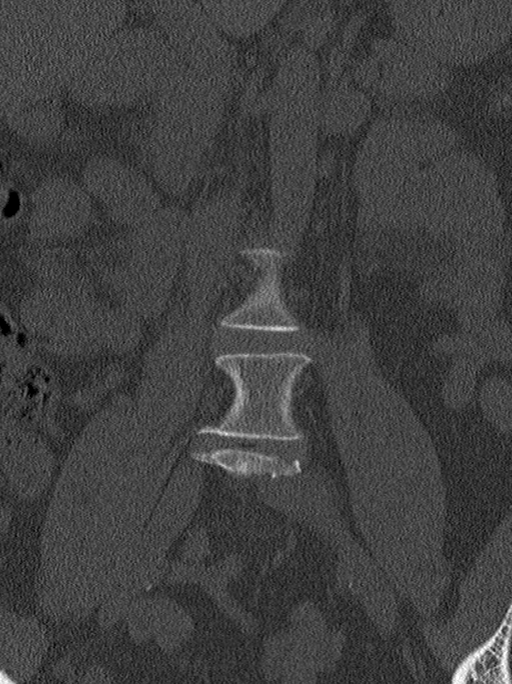
[im 25/61  bone]
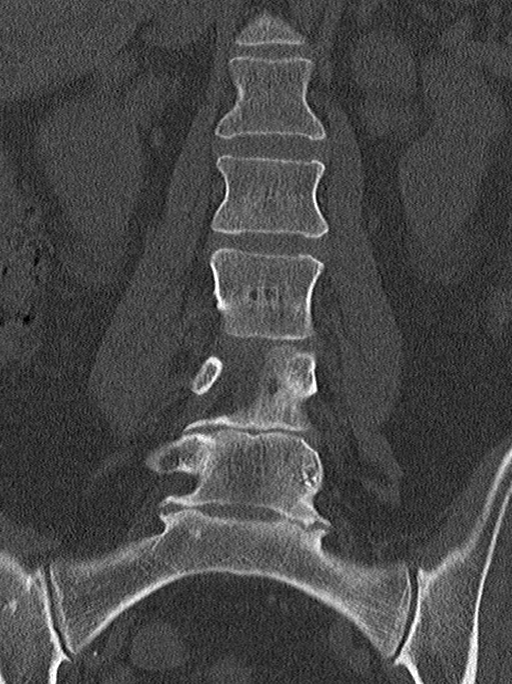
[im 37/61  bone]
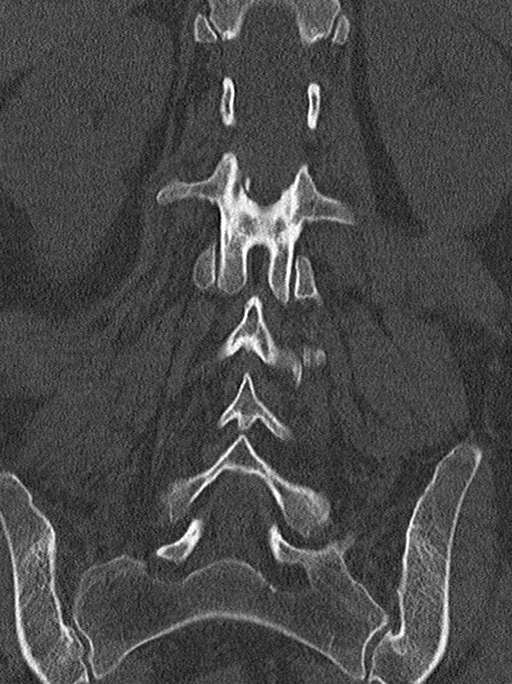

[13 of 33 positions shown; findings below may reference images not displayed]

FINDINGS: Segmentation: Standard.

Alignment: Normal.

Vertebrae: No acute fracture or focal pathologic process.

Paraspinal and other soft tissues: Scattered atherosclerosis. No
acute finding.

Disc levels: T12-L1: Negative.

L1-2, L2-3, L3-4: Negative.

L4-5: Vacuum disc phenomenon and a shallow bulge. Mild loss of disc
space height. Mild facet arthropathy. Disc and endplate spur cause
unchanged mild-to-moderate right foraminal narrowing. The central
canal and left foramen are open.

L5-S1: Loss of disc space height with a shallow bulge and endplate
spur. Left worse than right facet degenerative change. Central canal
and right foramen are widely patent. Moderate left foraminal
stenosis. No change.
IMPRESSION: No new abnormality since the patient's lumbar spine MRI [DATE].

Mild to moderate right foraminal narrowing L4-5.

Moderate left foraminal narrowing L5-S1.

## 2019-08-17 MED ORDER — METHYLPREDNISOLONE SODIUM SUCC 125 MG IJ SOLR
125.0000 mg | Freq: Once | INTRAMUSCULAR | Status: AC
  Administered 2019-08-17: 18:00:00 125 mg via INTRAVENOUS
  Filled 2019-08-17: qty 2

## 2019-08-17 MED ORDER — PREDNISONE 10 MG (21) PO TBPK: ORAL_TABLET | ORAL | 0 refills | Status: DC

## 2019-08-17 MED ORDER — OXYCODONE-ACETAMINOPHEN 5-325 MG PO TABS
1.0000 | ORAL_TABLET | Freq: Once | ORAL | Status: AC
  Administered 2019-08-17: 1 via ORAL
  Filled 2019-08-17: qty 1

## 2019-08-17 MED ORDER — ONDANSETRON 4 MG PO TBDP
4.0000 mg | ORAL_TABLET | Freq: Once | ORAL | Status: AC
  Administered 2019-08-17: 4 mg via ORAL
  Filled 2019-08-17: qty 1

## 2019-08-17 NOTE — ED Triage Notes (Addendum)
Pt from home via [facility]. Per EMS, pt presents to ED with chronic back pain. Per pt she had an appointment for an MRI today at [facility] clinic and PT; pt drove herself home and felt severe lower back pain and numbness to left leg. Per EMS son witnessed pt "seizure,shaking" while pt was on her car. Per EMS no LOC. Pt unable to recall event. [facility] reports HR 120 at rest. NAd noted upon arrival.

## 2019-08-17 NOTE — ED Notes (Signed)
Son at bedside. Son is pt's ride home.

## 2019-08-17 NOTE — ED Notes (Signed)
Patient transported to CT 

## 2019-08-17 NOTE — ED Notes (Signed)
This RN called lab and spoke with Blanch Media confirming blood specimen has been received.

## 2019-08-17 NOTE — ED Provider Notes (Signed)
[name] Emergency Department Provider Note  ____________________________________________  Time seen: Approximately 5:56 PM  I have reviewed the triage vital signs and the nursing notes.   HISTORY  Chief Complaint Back Pain    HPI Sandra Doyle is a 43 y.o. female with a history of chronic low back pain, presents to the emergency department with diminished sensation of the left lower extremity and weakness after physical therapy.  Patient reports that she had an MRI of her lumbar spine on 08/07/19 which revealed foraminal stenosis at L4-L5 and some degenerative changes but no other acute abnormality.  Patient is awaiting a call from her neurosurgeon for epidural steroid injections.  She denies bowel or bladder incontinence.  She has not been able to ambulate since worsening pain started.  No other alleviating measures have been attempted.        Past Medical History:  Diagnosis Date  . Migraines   . SVT (supraventricular tachycardia) (HCC)     There are no active problems to display for this patient.   Past Surgical History:  Procedure Laterality Date  . ABDOMINAL HYSTERECTOMY    . CARDIAC ELECTROPHYSIOLOGY MAPPING AND ABLATION    . CARDIAC SURGERY     ablasion  . WRIST SURGERY      Prior to Admission medications   Medication Sig Start Date End Date Taking? Authorizing Provider  chlorpheniramine-HYDROcodone (TUSSIONEX PENNKINETIC ER) 10-8 MG/5ML SUER Take 5 mLs by mouth every 12 (twelve) hours as needed. 02/01/18   Coral Spikes, DO  cyclobenzaprine (FLEXERIL) 5 MG tablet Take 1 tablet (5 mg total) by mouth 3 (three) times daily as needed. 07/20/19   Menshew, Dannielle Karvonen, PA-C  fluticasone (FLONASE) 50 MCG/ACT nasal spray Place 2 sprays into both nostrils daily. 02/01/18   Coral Spikes, DO  ketorolac (TORADOL) 10 MG tablet Take 1 tablet (10 mg total) by mouth every 8 (eight) hours. 07/20/19   Menshew, Dannielle Karvonen, PA-C  predniSONE  (STERAPRED UNI-PAK 21 TAB) 10 MG (21) TBPK tablet Take 6 tablets the first two days. Decease by one pill every two days until medication runs out. 08/17/19   Lannie Fields, PA-C    Allergies Patient has no known allergies.  Family History  Problem Relation Age of Onset  . Hypertension Mother   . Stroke Mother   . Osteoarthritis Mother   . Anuerysm Father     Social History Social History   Tobacco Use  . Smoking status: Former Smoker    Packs/day: 0.50    Types: Cigarettes  . Smokeless tobacco: Never Used  Substance Use Topics  . Alcohol use: Yes    Comment: occasionally  . Drug use: No     Review of Systems  Constitutional: No fever/chills Eyes: No visual changes. No discharge ENT: No upper respiratory complaints. Cardiovascular: no chest pain. Respiratory: no cough. No SOB. Gastrointestinal: No abdominal pain.  No nausea, no vomiting.  No diarrhea.  No constipation. Genitourinary: Negative for dysuria. No hematuria Musculoskeletal: Patient has low back pain.  Skin: Negative for rash, abrasions, lacerations, ecchymosis. Neurological: Negative for headaches, focal weakness or numbness.   ____________________________________________   PHYSICAL EXAM:  VITAL SIGNS: ED Triage Vitals  Enc Vitals Group     BP 08/17/19 1717 127/83     Pulse Rate 08/17/19 1717 (!) 117     Resp 08/17/19 1717 19     Temp 08/17/19 1717 98.1 F (36.7 C)     Temp Source 08/17/19  1717 Oral     SpO2 08/17/19 1717 96 %     Weight 08/17/19 1719 150 lb (68 kg)     Height 08/17/19 1719 5\' 4"  (1.626 m)     Head Circumference --      Peak Flow --      Pain Score 08/17/19 1718 10     Pain Loc --      Pain Edu? --      Excl. in GC? --      Constitutional: Alert and oriented. Well appearing and in no acute distress. Eyes: Conjunctivae are normal. PERRL. EOMI. Head: Atraumatic. Cardiovascular: Normal rate, regular rhythm. Normal S1 and S2.  Good peripheral circulation. Respiratory:  Normal respiratory effort without tachypnea or retractions. Lungs CTAB. Good air entry to the bases with no decreased or absent breath sounds. Gastrointestinal: Bowel sounds 4 quadrants. Soft and nontender to palpation. No guarding or rigidity. No palpable masses. No distention. No CVA tenderness. Musculoskeletal: Full range of motion to all extremities. No gross deformities appreciated. Neurologic: Patient has diminished sensation in all dermatomes of the left lower extremity in comparison to right.  After Solu-Medrol was administered, patient states that sensation returned and she was back to baseline. Skin:  Skin is warm, dry and intact. No rash noted. Psychiatric: Mood and affect are normal. Speech and behavior are normal. Patient exhibits appropriate insight and judgement.   ____________________________________________   LABS (all labs ordered are listed, but only abnormal results are displayed)  Labs Reviewed  COMPREHENSIVE METABOLIC PANEL - Abnormal; Notable for the following components:      Result Value   Potassium 3.2 (*)    All other components within normal limits  URINALYSIS, COMPLETE (UACMP) WITH MICROSCOPIC - Abnormal; Notable for the following components:   Color, Urine YELLOW (*)    APPearance HAZY (*)    Hgb urine dipstick MODERATE (*)    All other components within normal limits  CBC WITH DIFFERENTIAL/PLATELET   ____________________________________________  EKG   ____________________________________________  RADIOLOGY I personally viewed and evaluated these images as part of my medical decision making, as well as reviewing the written report by the radiologist.     Ct Lumbar Spine Wo Contrast  Result Date: 08/17/2019 CLINICAL DATA:  Chronic low back pain. Left leg numbness. Possible seizure today. No known injury. EXAM: CT LUMBAR SPINE WITHOUT CONTRAST TECHNIQUE: Multidetector CT imaging of the lumbar spine was performed without intravenous contrast  administration. Multiplanar CT image reconstructions were also generated. COMPARISON:  MRI lumbar spine 08/07/2019. FINDINGS: Segmentation: Standard. Alignment: Normal. Vertebrae: No acute fracture or focal pathologic process. Paraspinal and other soft tissues: Scattered atherosclerosis. No acute finding. Disc levels: T12-L1: Negative. L1-2, L2-3, L3-4: Negative. L4-5: Vacuum disc phenomenon and a shallow bulge. Mild loss of disc space height. Mild facet arthropathy. Disc and endplate spur cause unchanged mild-to-moderate right foraminal narrowing. The central canal and left foramen are open. L5-S1: Loss of disc space height with a shallow bulge and endplate spur. Left worse than right facet degenerative change. Central canal and right foramen are widely patent. Moderate left foraminal stenosis. No change. IMPRESSION: No new abnormality since the patient's lumbar spine MRI 08/07/2019. Mild to moderate right foraminal narrowing L4-5. Moderate left foraminal narrowing L5-S1. Electronically Signed   By: Drusilla Kanner M.D.   On: 08/17/2019 18:26    ____________________________________________    PROCEDURES  Procedure(s) performed:    Procedures    Medications  oxyCODONE-acetaminophen (PERCOCET/ROXICET) 5-325 MG per tablet 1 tablet (1 tablet  Oral Given 08/17/19 1811)  ondansetron (ZOFRAN-ODT) disintegrating tablet 4 mg (4 mg Oral Given 08/17/19 1811)  methylPREDNISolone sodium succinate (SOLU-MEDROL) 125 mg/2 mL injection 125 mg (125 mg Intravenous Given 08/17/19 1811)     ____________________________________________   INITIAL IMPRESSION / ASSESSMENT AND PLAN / ED COURSE  Pertinent labs & imaging results that were available during my care of the patient were reviewed by me and considered in my medical decision making (see chart for details).  Review of the [place] CSRS was performed in accordance of the [facility] prior to dispensing any controlled drugs.         Assessment and Plan:  Low Back  Pain:  43 year old female presents to the emergency department with worsening low back pain and diminished sensation of the left lower extremity after having physical therapy.  Patient had MRI of her lumbar spine on 08/07/2019 and MRI revealed foraminal stenosis but no other acute abnormality.  Vital signs were reassuring at triage.  Patient initially had diminished sensation in all dermatomes of left lower extremity.  CT of the lumbar spine was obtained which revealed no changes from MRI obtained on 08/07/2019.  IV Solu-Medrol was given and patient reports that sensation returned and she was back to baseline.  Patient was discharged with tapered prednisone and was advised to follow-up with neurosurgery.  Patient is an established patient with neurosurgery and is waiting on epidural injections.  Patient states that she felt ready to go home.  Return precautions were given to return to the emergency department with persistent loss of sensation, loss of strength, bowel or bladder incontinence or saddle anesthesia.    ____________________________________________  FINAL CLINICAL IMPRESSION(S) / ED DIAGNOSES  Final diagnoses:  Acute bilateral low back pain with left-sided sciatica      NEW MEDICATIONS STARTED DURING THIS VISIT:  ED Discharge Orders         Ordered    predniSONE (STERAPRED UNI-PAK 21 TAB) 10 MG (21) TBPK tablet     08/17/19 1850              This chart was dictated using voice recognition software/Dragon. Despite best efforts to proofread, errors can occur which can change the meaning. Any change was purely unintentional.    Orvil FeilWoods, Corliss Lamartina M, PA-C 08/17/19 1914    Phineas SemenGoodman, [place], MD 08/17/19 2004

## 2020-02-15 ENCOUNTER — Telehealth: Payer: Self-pay

## 2020-02-15 NOTE — Progress Notes (Signed)
Patient: Sandra Doyle  Service Category: E/M  Provider: Gillis Santa, MD  DOB: 11-09-1976  DOS: 02/16/2020  Location: Office  MRN: [id number]  Setting: Ambulatory outpatient  Referring Provider: Marin Olp, PA-C  Type: New Patient  Specialty: Interventional Pain Management  PCP: Ricardo Jericho, NP  Location: Home  Delivery: TeleHealth     Virtual Encounter - Pain Management PROVIDER NOTE: Information contained herein reflects review and annotations entered in association with encounter. Interpretation of such information and data should be left to medically-trained personnel. Information provided to patient can be located elsewhere in the medical record under "Patient Instructions". Document created using STT-dictation technology, any transcriptional errors that may result from process are unintentional.    Contact & Pharmacy Preferred: [phone] Home: [phone] (home) Mobile: [phone] (mobile) E-mail: [email or web address]  [id number] [address]Phone: [phone]: [phone]  Pre-screening note:  Our staff contacted Ms. MDittonand offered her an "in person", "face-to-face" appointment versus a telephone encounter. She indicated preferring the telephone encounter, at this time.  Primary Reason(s) for Visit: Tele-Encounter for initial evaluation of one or more chronic problems (new to examiner) potentially causing chronic pain, and posing a threat to normal musculoskeletal function. (Level of risk: High) CC: Back Pain, Arm Pain, Ankle Pain, Hip Pain, Joint Pain, Neck Pain, Shoulder Pain, and Knee Pain  This visit was completed via telephone due to the restrictions of the COVID-19 pandemic. All issues as above were discussed and addressed but no physical exam was performed. If it was felt that the patient should be evaluated in the office, they were directed there. The patient verbally consented to  this visit. Patient was unable to complete an audio/visual visit due to Technical difficulties and/or Lack of internet. Due to the catastrophic nature of the COVID-19 pandemic, this visit was done through audio contact only.  Location of the patient: home address (see Epic for details)  Location of the provider: office  I contacted CLuther Parodyon 02/16/2020 via telephone.      I clearly identified myself as BGillis Santa MD. I verified that I was speaking with the correct person using two identifiers (Name: Sandra Doyle and date of birth: 1April 04, 1977.  Advanced Informed Consent I sought verbal advanced consent from CLuther Parodyfor virtual visit interactions. I informed Ms. MKreischerof possible security and privacy concerns, risks, and limitations associated with providing "not-in-person" medical evaluation and management services. I also informed Ms. MAttawayof the availability of "in-person" appointments. Finally, I informed her that there would be a charge for the virtual visit and that she could be  personally, fully or partially, financially responsible for it. Ms. MRemsenexpressed understanding and agreed to proceed.   HPI  Ms. MMcgonagleis a 44y.o. year old, female patient, contacted today for an initial evaluation of her chronic pain. She has Left lumbar radiculopathy; Sacral back pain; Fibromyalgia; [place]iatic arthritis [place]; and Chronic pain syndrome on their problem list.   Onset and Duration: Gradual and Present longer than 3 months Cause of pain: Unknown Severity: Getting worse and NAS-11 at its worse: 15/10 Timing: Not influenced by the time of the day Aggravating Factors: States it depends on what part of the body that you are talking about Alleviating Factors: Hot water Associated Problems: Weakness, Pain that wakes patient up and Pain that does not allow patient to sleep Quality of Pain: Aching, Constant, Sharp and Throbbing  Previous Examinations or Tests: MRI scan,  X-rays, Neurological evaluation and Neurosurgical evaluation Previous Treatments: Epidural steroid injections and Physical Therapy  Patient is a pleasant 44 year old female who presents with a chief complaint of low back pain that is worse on the left side that radiates down into her lateral thigh region.  She also endorses diffuse whole body pain present in multiple joints including her elbows, shoulders, wrists, knees.  She also has a history of psoriatic arthritis.  Patient's low back pain started in July, after picking up water.  MRI of her lumbar spine shows severe left disc degeneration at L4-L5 and L5-S1 along with small disc protrusion affecting the L5-S1 nerve on the left.  Mild spinal stenosis on the right.  Has seen Dr Sharlet Salina with [facility] for lumbar radiculitis in the past.  She has had transforaminal left L5-S1 and L4-L5 epidural steroid injection in the past.  Patient had significant procedural anxiety.  Patient's most recent injection was October 11, 2019.  Patient has tried PT in the past but after her first visit, her pain increased that she had to go to ER that evening.  She states that her mother has a history of fibromyalgia and that she also thinks that she has fibromyalgia.  She has multiple tender points along multiple joints.  We will focus on nonopioid-based pain management which I made very clear with the patient   Meds   Current Outpatient Medications:  .  etodolac (LODINE) 400 MG tablet, Take by mouth., Disp: , Rfl:  .  SUMAtriptan (IMITREX) 100 MG tablet, Take 50-100 mg as needed at headache onset. May repeat after 2 hours if needed., Disp: , Rfl:  .  acetaminophen (TYLENOL) 500 MG tablet, Take 2 tablets (1,000 mg total) by mouth every 12 (twelve) hours as needed for moderate pain., Disp: 100 tablet, Rfl: 2 .  DULoxetine (CYMBALTA) 20 MG capsule, Take 1 capsule (20 mg total) by mouth daily for 21 days, THEN 2 capsules (40 mg total) daily for 21 days, THEN 3  capsules (60 mg total) daily for 21 days., Disp: 126 capsule, Rfl: 0  ROS  Cardiovascular: Abnormal heart rhythmSVT Pulmonary or Respiratory: No reported pulmonary signs or symptoms such as wheezing and difficulty taking a deep full breath (Asthma), difficulty blowing air out (Emphysema), coughing up mucus (Bronchitis), persistent dry cough, or temporary stoppage of breathing during sleep Neurological: No reported neurological signs or symptoms such as seizures, abnormal skin sensations, urinary and/or fecal incontinence, being born with an abnormal open spine and/or a tethered spinal cord Psychological-Psychiatric: No reported psychological or psychiatric signs or symptoms such as difficulty sleeping, anxiety, depression, delusions or hallucinations (schizophrenial), mood swings (bipolar disorders) or suicidal ideations or attempts Gastrointestinal: No reported gastrointestinal signs or symptoms such as vomiting or evacuating blood, reflux, heartburn, alternating episodes of diarrhea and constipation, inflamed or scarred liver, or pancreas or irrregular and/or infrequent bowel movements Genitourinary: No reported renal or genitourinary signs or symptoms such as difficulty voiding or producing urine, peeing blood, non-functioning kidney, kidney stones, difficulty emptying the bladder, difficulty controlling the flow of urine, or chronic kidney disease Hematological: No reported hematological signs or symptoms such as prolonged bleeding, low or poor functioning platelets, bruising or bleeding easily, hereditary bleeding problems, low energy levels due to low hemoglobin or being anemic Endocrine: No reported endocrine signs or symptoms such as high or low blood sugar, rapid heart rate due to high thyroid levels, obesity or weight gain due to slow thyroid or thyroid  disease Rheumatologic: No reported rheumatological signs and symptoms such as fatigue, joint pain, tenderness, swelling, redness, heat,  stiffness, decreased range of motion, with or without associated rash Musculoskeletal: Negative for myasthenia gravis, muscular dystrophy, multiple sclerosis or malignant hyperthermia Work History: Working full time  Allergies  Ms. Koffman has No Known Allergies.  Laboratory Chemistry Profile   Renal Lab Results  Component Value Date   BUN 16 08/17/2019   CREATININE 0.98 08/17/2019   GFRAA >60 08/17/2019   GFRNONAA >60 08/17/2019   PROTEINUR NEGATIVE 08/17/2019    Electrolytes Lab Results  Component Value Date   NA 143 08/17/2019   K 3.2 (L) 08/17/2019   CL 107 08/17/2019   CALCIUM 10.2 08/17/2019    Hepatic Lab Results  Component Value Date   AST 22 08/17/2019   ALT 21 08/17/2019   ALBUMIN 4.2 08/17/2019   ALKPHOS 88 08/17/2019    ID No results found for: LYMEIGGIGMAB, HIV, [place], STAPHAUREUS, MRSAPCR, HCVAB, PREGTESTUR, RMSFIGG, QFVRPH1IGG, QFVRPH2IGG, LYMEIGGIGMAB  Bone No results found for: VD25OH, H139778, [id number], [id number], 25OHVITD1, 25OHVITD2, 25OHVITD3, TESTOFREE, TESTOSTERONE  Endocrine Lab Results  Component Value Date   GLUCOSE 95 08/17/2019   GLUCOSEU NEGATIVE 08/17/2019    Neuropathy No results found for: VITAMINB12, FOLATE, HGBA1C, HIV  CNS No results found for: COLORCSF, APPEARCSF, RBCCOUNTCSF, WBCCSF, POLYSCSF, LYMPHSCSF, EOSCSF, PROTEINCSF, GLUCCSF, JCVIRUS, CSFOLI, IGGCSF, LABACHR, ACETBL, LABACHR, ACETBL  Inflammation (CRP: Acute  ESR: Chronic) No results found for: CRP, ESRSEDRATE, LATICACIDVEN  Rheumatology No results found for: RF, ANA, LABURIC, URICUR, LYMEIGGIGMAB, LYMEABIGMQN, HLAB27  Coagulation Lab Results  Component Value Date   PLT 337 08/17/2019    Cardiovascular Lab Results  Component Value Date   TROPONINI <0.03 01/09/2019   HGB 13.1 08/17/2019   HCT 38.4 08/17/2019    Screening No results found for: SARSCOV2NAA, COVIDSOURCE, STAPHAUREUS, MRSAPCR, HCVAB, HIV, PREGTESTUR  Cancer No results found for: CEA, CA125,  LABCA2  Allergens No results found for: ALMOND, APPLE, ASPARAGUS, AVOCADO, BANANA, BARLEY, BASIL, BAYLEAF, GREENBEAN, LIMABEAN, WHITEBEAN, BEEFIGE, REDBEET, BLUEBERRY, BROCCOLI, CABBAGE, MELON, CARROT, CASEIN, CASHEWNUT, CAULIFLOWER, CELERY    Note: Lab results reviewed.   Imaging Review  Cervical Imaging:   Shoulder-R DG:  Results for orders placed during the hospital encounter of 11/06/15  DG Shoulder Right   Narrative CLINICAL DATA:  Right shoulder pain  EXAM: RIGHT SHOULDER - 2+ VIEW  COMPARISON:  None.  FINDINGS: There is no evidence of fracture or dislocation. There is no evidence of arthropathy or other focal bone abnormality. Soft tissues are unremarkable.  IMPRESSION: Negative.   Electronically Signed   By: Kerby Moors M.D.   On: 11/06/2015 19:21      Lumbosacral Imaging: Lumbar MR wo contrast:  Results for orders placed during the hospital encounter of 08/07/19  MR LUMBAR SPINE WO CONTRAST   Narrative CLINICAL DATA:  44 year old female with central and left side low back pain radiating to the left hip, buttock, and the bilateral legs.  EXAM: MRI LUMBAR SPINE WITHOUT CONTRAST  TECHNIQUE: Multiplanar, multisequence MR imaging of the lumbar spine was performed. No intravenous contrast was administered.  COMPARISON:  Lumbar radiographs 03/24/2016. Report of lumbar radiographs 07/28/2019 (no images available).  FINDINGS: Segmentation:  Normal.  Alignment: Mild chronic levoconvex lumbar scoliosis. Stable lumbar lordosis since 2017. Subtle retrolisthesis of L5 on S1.  Vertebrae: Chronic degenerative endplate signal changes at L5-S1, mostly far lateral. Background bone marrow signal appears normal. No marrow edema or evidence of acute osseous abnormality. Intact visible sacrum and  SI joints.  Conus medullaris and cauda equina: Conus extends to the T12-L1 level. No lower spinal cord or conus signal abnormality.  Paraspinal and other soft  tissues: Negative.  Disc levels:  T11-T12: Mild facet hypertrophy on the left.  T12-L1:  Negative.  L1-L2:  Negative.  L2-L3:  Negative.  L3-L4: Minimal disc bulge and posterior element hypertrophy. No convincing stenosis.  L4-L5: Disc space loss. Circumferential disc bulge and endplate spurring eccentric to the right. Mild posterior element hypertrophy. No spinal or convincing lateral recess stenosis. Moderate right L4 foraminal stenosis (series 5, image 4).  L5-S1: Disc space loss. Circumferential disc bulge and endplate spurring. Mild-to-moderate left side facet hypertrophy. No spinal or lateral recess stenosis. Moderate left L5 foraminal stenosis (series 5, image 11). Borderline to mild right L5 foraminal stenosis.  IMPRESSION: 1. Chronic disc and endplate degeneration at L4-L5 and L5-S1. No spinal or convincing lateral recess stenosis, although there is moderate foraminal stenosis at the right L4 and left L5 nerve levels. 2. Mild lumbar spine degeneration elsewhere. Underlying mild chronic levoconvex scoliosis.   Electronically Signed   By: Genevie Ann M.D.   On: 08/07/2019 19:13    Results for orders placed during the hospital encounter of 08/17/19  CT Lumbar Spine Wo Contrast   Narrative CLINICAL DATA:  Chronic low back pain. Left leg numbness. Possible seizure today. No known injury.  EXAM: CT LUMBAR SPINE WITHOUT CONTRAST  TECHNIQUE: Multidetector CT imaging of the lumbar spine was performed without intravenous contrast administration. Multiplanar CT image reconstructions were also generated.  COMPARISON:  MRI lumbar spine 08/07/2019.  FINDINGS: Segmentation: Standard.  Alignment: Normal.  Vertebrae: No acute fracture or focal pathologic process.  Paraspinal and other soft tissues: Scattered atherosclerosis. No acute finding.  Disc levels: T12-L1: Negative.  L1-2, L2-3, L3-4: Negative.  L4-5: Vacuum disc phenomenon and a shallow bulge. Mild  loss of disc space height. Mild facet arthropathy. Disc and endplate spur cause unchanged mild-to-moderate right foraminal narrowing. The central canal and left foramen are open.  L5-S1: Loss of disc space height with a shallow bulge and endplate spur. Left worse than right facet degenerative change. Central canal and right foramen are widely patent. Moderate left foraminal stenosis. No change.  IMPRESSION: No new abnormality since the patient's lumbar spine MRI 08/07/2019.  Mild to moderate right foraminal narrowing L4-5.  Moderate left foraminal narrowing L5-S1.   Electronically Signed   By: Inge Rise M.D.   On: 08/17/2019 18:26    Lumbar DG (Complete) 4+V:  Results for orders placed during the hospital encounter of 03/24/16  DG Lumbar Spine Complete   Narrative CLINICAL DATA:  Low back pain radiating into left buttock.  EXAM: LUMBAR SPINE - COMPLETE 4+ VIEW  COMPARISON:  None.  FINDINGS: No evidence of fracture or subluxation. Spondylosis present at L4-5 and L5-S1 consisting of mild disc space narrowing as well as facet hypertrophy. No bony lesions or destruction identified.  IMPRESSION: Lumbar spondylosis at L4-5 and L5-S1.   Electronically Signed   By: Aletta Edouard M.D.   On: 03/24/2016 18:00          Sacroiliac Joint Imaging: Sacroiliac Joint DG:  Results for orders placed during the hospital encounter of 03/24/16  DG Si Joints   Narrative CLINICAL DATA:  Low back pain.  EXAM: BILATERAL SACROILIAC JOINTS - 3+ VIEW  COMPARISON:  None.  FINDINGS: The sacroiliac joints are symmetric. Minimal degenerative changes are seen involving inferior SI joints bilaterally. No erosions or bony destruction.  No focal bony lesions are seen.  IMPRESSION: No fracture identified. Minimal degenerative changes of both SI joints.   Electronically Signed   By: Aletta Edouard M.D.   On: 03/24/2016 17:59    Wrist Imaging: Wrist-R DG Complete:  Results  for orders placed during the hospital encounter of 07/11/16  DG Wrist Complete Right   Narrative CLINICAL DATA:  Pt with ulnar right wrist pain after straining it today lifting a large load of laundry. Pt hx of right wrist fracture with metal plate 2009. EXAM: RIGHT WRIST - COMPLETE 3+ VIEW COMPARISON:  None. FINDINGS: No acute fracture.  No dislocation. Or old fracture of the distal radius has been reduced with a volar fixation plate and screws. The orthopedic hardware is well-seated. Fracture is well healed. There is a small, old, associated ulnar styloid fracture. The joints are normally spaced and aligned. No significant arthropathic change. Soft tissues are unremarkable. IMPRESSION: 1. No acute fracture. 2. No evidence of loosening of the distal radius orthopedic hardware. 3. No arthropathic change. Electronically Signed   By: Lajean Manes M.D.   On: 07/11/2016 19:27    Complexity Note: Imaging results reviewed. Results shared with Ms. Haman, using Layman's terms.                         [place]  Drug: Ms. Prevette  reports no history of drug use. Alcohol:  reports current alcohol use. Tobacco:  reports that she has quit smoking. Her smoking use included cigarettes. She smoked 0.50 packs per day. She has never used smokeless tobacco. Medical:  has a past medical history of Migraines and SVT (supraventricular tachycardia) ([place]). Family: family history includes Anuerysm in her father; Hypertension in her mother; Osteoarthritis in her mother; Stroke in her mother.  Past Surgical History:  Procedure Laterality Date  . ABDOMINAL HYSTERECTOMY    . CARDIAC ELECTROPHYSIOLOGY MAPPING AND ABLATION    . CARDIAC SURGERY     ablasion  . WRIST SURGERY     Active Ambulatory Problems    Diagnosis Date Noted  . Left lumbar radiculopathy 04/17/2016  . Sacral back pain 04/17/2016  . Fibromyalgia 02/16/2020  . Psoriatic arthritis ([place]) 02/16/2020  . Chronic pain syndrome 02/16/2020    Resolved Ambulatory Problems    Diagnosis Date Noted  . No Resolved Ambulatory Problems   Past Medical History:  Diagnosis Date  . Migraines   . SVT (supraventricular tachycardia) [facility])    Assessment  Primary Diagnosis & Pertinent Problem List: The primary encounter diagnosis was Left lumbar radiculopathy (Left L4/5 and Left L5/S1). Diagnoses of Sacral back pain, Chronic migraine without aura without status migrainosus, not intractable, Fibromyalgia, Psoriatic arthritis ([place]), and Chronic pain syndrome were also pertinent to this visit.  Visit Diagnosis (New problems to examiner): 1. Left lumbar radiculopathy (Left L4/5 and Left L5/S1)   2. Sacral back pain   3. Chronic migraine without aura without status migrainosus, not intractable   4. Fibromyalgia   5. Psoriatic arthritis ([place])   6. Chronic pain syndrome     General Recommendations: The pain condition that the patient suffers from is best treated with a multidisciplinary approach that involves an increase in physical activity to prevent de-conditioning and worsening of the pain cycle, as well as psychological counseling (formal and/or informal) to address the co-morbid psychological affects of pain. Treatment will often involve judicious use of pain medications and interventional procedures to decrease the pain, allowing the patient to participate in the  physical activity that will ultimately produce long-lasting pain reductions. The goal of the multidisciplinary approach is to return the patient to a higher level of overall function and to restore their ability to perform activities of daily living.  1.  Lumbar radiculopathy: Chronic disc and endplate degeneration at L4-L5 and L5-S1. No spinal or convincing lateral recess stenosis, although there is moderate foraminal stenosis at the right L4 and left L5 nerve levels.  Has previously had transforaminal epidural steroid injections done with [facility] which were somewhat  effective.  Previous one performed in October.  Can repeat in future.  Patient has tried gabapentin in the past for her radicular pain symptoms which was not very effective and has since discontinued.  Start Cymbalta as below.  2.  Fibromyalgia: Had extensive discussion about evidence-based strategies for management of fibromyalgia.  Opioid therapy is not indicated and can amplify fibromyalgia related pain.  Focus on exercise, physical activity, dietary triggers, reduction in red meat, dairy products.  Also discussed increase in vegetable, leafy intake.  Discussed Cymbalta versus Lyrica for fibroid management.  We will start with Cymbalta, titration instructions as below.  3.  Psoriatic arthritis: Has tried various NSAIDs and is currently on Lodine.  May need advanced therapy such as methotrexate or Humira.  Will refer to rheumatology.  4.  Migraine management: on Imitrex prn, Can consider Botox injection therapy in future.  5. Follow-up in 8 to 9 weeks for medication management.  Plan of Care (Initial workup plan)   Referral Orders     Ambulatory referral to Rheumatology  Pharmacotherapy (current): Medications ordered:  Meds ordered this encounter  Medications  . acetaminophen (TYLENOL) 500 MG tablet    Sig: Take 2 tablets (1,000 mg total) by mouth every 12 (twelve) hours as needed for moderate pain.    Dispense:  100 tablet    Refill:  2  . DULoxetine (CYMBALTA) 20 MG capsule    Sig: Take 1 capsule (20 mg total) by mouth daily for 21 days, THEN 2 capsules (40 mg total) daily for 21 days, THEN 3 capsules (60 mg total) daily for 21 days.    Dispense:  126 capsule    Refill:  0     Pharmacological management options:  Opioid Analgesics: will not be part of treatment plan  Membrane stabilizer: Tried and failed gabapentin.  Trial of Cymbalta now.  Consider Lyrica in future  Muscle relaxant: To be determined at a later time  NSAID: Currently on Lodine.  Can consider diclofenac, Celebrex,  meloxicam in future  Other analgesic(s): To be determined at a later time   Interventional management options: Ms. Hult was informed that there is no guarantee that she would be a candidate for interventional therapies. The decision will be based on the results of diagnostic studies, as well as Ms. Delafuente risk profile.  Procedure(s) under consideration:  L4/5 or L5/S1 ESI botox for migraine management   Provider-requested follow-up: Return in about 8 weeks (around 04/12/2020) for Medication Management, in person (M/W [place] ok).  No future appointments. Total duration of encounter: 45 minutes.  Primary Care Physician: Ricardo Jericho, NP Note by: Gillis Santa, MD Date: 02/16/2020; Time: 11:56 AM

## 2020-02-15 NOTE — Telephone Encounter (Signed)
Left message for patient to call us for new patient questionaire.

## 2020-02-16 ENCOUNTER — Ambulatory Visit
Payer: BC Managed Care – PPO | Attending: Student in an Organized Health Care Education/Training Program | Admitting: Student in an Organized Health Care Education/Training Program

## 2020-02-16 ENCOUNTER — Encounter: Payer: Self-pay | Admitting: Student in an Organized Health Care Education/Training Program

## 2020-02-16 ENCOUNTER — Other Ambulatory Visit: Payer: Self-pay

## 2020-02-16 VITALS — Ht 64.0 in | Wt 155.0 lb

## 2020-02-16 DIAGNOSIS — G894 Chronic pain syndrome: Secondary | ICD-10-CM | POA: Insufficient documentation

## 2020-02-16 DIAGNOSIS — M797 Fibromyalgia: Secondary | ICD-10-CM | POA: Insufficient documentation

## 2020-02-16 DIAGNOSIS — M533 Sacrococcygeal disorders, not elsewhere classified: Secondary | ICD-10-CM | POA: Diagnosis not present

## 2020-02-16 DIAGNOSIS — G43709 Chronic migraine without aura, not intractable, without status migrainosus: Secondary | ICD-10-CM | POA: Diagnosis not present

## 2020-02-16 DIAGNOSIS — L4050 Arthropathic psoriasis, unspecified: Secondary | ICD-10-CM | POA: Insufficient documentation

## 2020-02-16 DIAGNOSIS — M5416 Radiculopathy, lumbar region: Secondary | ICD-10-CM

## 2020-02-16 MED ORDER — DULOXETINE HCL 20 MG PO CPEP: ORAL_CAPSULE | ORAL | 0 refills | Status: DC

## 2020-02-16 MED ORDER — ACETAMINOPHEN 500 MG PO TABS: 1000.0000 mg | ORAL_TABLET | Freq: Two times a day (BID) | ORAL | 2 refills | Status: DC | PRN

## 2020-02-23 ENCOUNTER — Ambulatory Visit: Payer: BC Managed Care – PPO | Attending: Internal Medicine

## 2020-02-23 DIAGNOSIS — Z23 Encounter for immunization: Secondary | ICD-10-CM

## 2020-02-23 NOTE — Progress Notes (Signed)
   Covid-19 Vaccination Clinic  Name:  Eleri Ruben    MRN: [id number] DOB: October 11, 1976  02/23/2020  Ms. Tuller was observed post Covid-19 immunization for 15 minutes without incident. She was provided with Vaccine Information Sheet and instruction to access the V-Safe system.   Ms. Wofford was instructed to call 911 with any severe reactions post vaccine: Marland Kitchen Difficulty breathing  . Swelling of face and throat  . A fast heartbeat  . A bad rash all over body  . Dizziness and weakness   Immunizations Administered    Name Date Dose VIS Date Route   Pfizer COVID-19 Vaccine 02/23/2020 10:59 AM 0.3 mL 11/25/2019 Intramuscular   Manufacturer: [employer], [employer]   Lot: [id number]   NDC: [address]-0

## 2020-03-20 ENCOUNTER — Ambulatory Visit: Payer: BC Managed Care – PPO | Attending: Internal Medicine

## 2020-03-20 DIAGNOSIS — Z23 Encounter for immunization: Secondary | ICD-10-CM

## 2020-03-20 NOTE — Progress Notes (Signed)
   Covid-19 Vaccination Clinic  Name:  Sandra Doyle    MRN: [id number] DOB: 1976/03/27  03/20/2020  Sandra Doyle was observed post Covid-19 immunization for 15 minutes without incident. She was provided with Vaccine Information Sheet and instruction to access the V-Safe system.   Sandra Doyle was instructed to call 911 with any severe reactions post vaccine: Marland Kitchen Difficulty breathing  . Swelling of face and throat  . A fast heartbeat  . A bad rash all over body  . Dizziness and weakness   Immunizations Administered    Name Date Dose VIS Date Route   Pfizer COVID-19 Vaccine 03/20/2020  1:25 PM 0.3 mL 11/25/2019 Intramuscular   Manufacturer: [employer], [employer]   Lot: [id number]   NDC: [address]-3

## 2020-03-22 ENCOUNTER — Telehealth: Payer: Self-pay | Admitting: *Deleted

## 2020-04-11 ENCOUNTER — Ambulatory Visit
Payer: BC Managed Care – PPO | Attending: Student in an Organized Health Care Education/Training Program | Admitting: Student in an Organized Health Care Education/Training Program

## 2020-04-11 ENCOUNTER — Other Ambulatory Visit: Payer: Self-pay

## 2020-04-11 ENCOUNTER — Encounter: Payer: Self-pay | Admitting: Student in an Organized Health Care Education/Training Program

## 2020-04-11 VITALS — BP 124/90 | HR 91 | Temp 97.5°F | Ht 64.0 in | Wt 148.0 lb

## 2020-04-11 DIAGNOSIS — G894 Chronic pain syndrome: Secondary | ICD-10-CM | POA: Diagnosis not present

## 2020-04-11 DIAGNOSIS — G43709 Chronic migraine without aura, not intractable, without status migrainosus: Secondary | ICD-10-CM | POA: Diagnosis not present

## 2020-04-11 DIAGNOSIS — M797 Fibromyalgia: Secondary | ICD-10-CM

## 2020-04-11 DIAGNOSIS — L4050 Arthropathic psoriasis, unspecified: Secondary | ICD-10-CM | POA: Diagnosis not present

## 2020-04-11 MED ORDER — PREGABALIN 25 MG PO CAPS: ORAL_CAPSULE | ORAL | 0 refills | Status: DC

## 2020-04-11 MED ORDER — DULOXETINE HCL 60 MG PO CPEP: 60.0000 mg | ORAL_CAPSULE | Freq: Every day | ORAL | 2 refills | Status: DC

## 2020-04-11 NOTE — Progress Notes (Signed)
PROVIDER NOTE: Information contained herein reflects review and annotations entered in association with encounter. Interpretation of such information and data should be left to medically-trained personnel. Information provided to patient can be located elsewhere in the medical record under "Patient Instructions". Document created using STT-dictation technology, any transcriptional errors that may result from process are unintentional.    Patient: Sandra Doyle  Service Category: E/M  Provider: Gillis Santa, MD  DOB: 10-11-1976  DOS: 04/11/2020  Referring Provider: Ricardo Jericho*  MRN: [id number]  Setting: Ambulatory outpatient  PCP: Ricardo Jericho, NP  Type: Established Patient  Specialty: Interventional Pain Management    Location: Office  Delivery: Face-to-face     Primary Reason(s) for Visit: Evaluation of chronic illnesses with exacerbation, or progression (Level of risk: moderate) CC: Generalized Body Aches  HPI  Sandra Doyle is a 44 y.o. year old, female patient, who comes today for a follow-up evaluation. She has Left lumbar radiculopathy; Sacral back pain; Fibromyalgia; Psoriatic arthritis ([place]); Chronic pain syndrome; and Chronic migraine without aura without status migrainosus, not intractable on their problem list. Sandra Doyle was last seen on 02/16/2020. Her primarily concern today is the Generalized Body Aches  Pain Assessment: Location:   Generalized Radiating: Generalizied Onset: More than a month ago Duration: Chronic pain Quality: Burning, Aching, Shooting, Nagging, Throbbing, Constant Severity: 5 /10 (subjective, self-reported pain score)  Note: Reported level is compatible with observation.                         When using our objective Pain Scale, levels between 6 and 10/10 are said to belong in an emergency room, as it progressively worsens from a 6/10, described as severely limiting, requiring emergency care not usually available at an outpatient pain  management facility. At a 6/10 level, communication becomes difficult and requires great effort. Assistance to reach the emergency department may be required. Facial flushing and profuse sweating along with potentially dangerous increases in heart rate and blood pressure will be evident. Effect on ADL: limits my daily activities Timing: Constant Modifying factors: nothing BP: 124/90  HR: 91  Further details on both, my assessment(s), as well as the proposed treatment plan, please see below.  Patient states that she is doing better since our last visit.  She was able to titrate her Cymbalta to 60 mg and states that it is helping.  She states that she has also reduced meat intake and she feels this is helped out with her myalgias.  She continues to struggle with pain related to fibromyalgia but feels that it is better managed than before.  We discussed the addition of Lyrica which could work synergistically with Cymbalta in helping to manage her pain.  For psoriatic arthritis, will place referral for rheumatology.  Overall her migraines are doing well.  Less than 15 headache days a month.  If this worsens, can further discuss Botox therapy for migraine management.   Laboratory Chemistry Profile   Renal Lab Results  Component Value Date   BUN 16 08/17/2019   CREATININE 0.98 08/17/2019   GFRAA >60 08/17/2019   GFRNONAA >60 08/17/2019   PROTEINUR NEGATIVE 08/17/2019     Electrolytes Lab Results  Component Value Date   NA 143 08/17/2019   K 3.2 (L) 08/17/2019   CL 107 08/17/2019   CALCIUM 10.2 08/17/2019     Hepatic Lab Results  Component Value Date   AST 22 08/17/2019   ALT 21 08/17/2019  ALBUMIN 4.2 08/17/2019   ALKPHOS 88 08/17/2019     ID No results found for: LYMEIGGIGMAB, HIV, SARSCOV2NAA, STAPHAUREUS, MRSAPCR, HCVAB, PREGTESTUR, RMSFIGG, QFVRPH1IGG, QFVRPH2IGG, LYMEIGGIGMAB   Bone No results found for: VD25OH, DX412IN8MVE, HM0947SJ6, GE3662HU7, 25OHVITD1, 25OHVITD2,  25OHVITD3, TESTOFREE, TESTOSTERONE   Endocrine Lab Results  Component Value Date   GLUCOSE 95 08/17/2019   GLUCOSEU NEGATIVE 08/17/2019     Neuropathy No results found for: VITAMINB12, FOLATE, HGBA1C, HIV   CNS No results found for: COLORCSF, APPEARCSF, RBCCOUNTCSF, WBCCSF, POLYSCSF, LYMPHSCSF, EOSCSF, PROTEINCSF, GLUCCSF, JCVIRUS, CSFOLI, IGGCSF, LABACHR, ACETBL, LABACHR, ACETBL   Inflammation (CRP: Acute  ESR: Chronic) No results found for: CRP, ESRSEDRATE, LATICACIDVEN   Rheumatology No results found for: RF, ANA, LABURIC, URICUR, LYMEIGGIGMAB, LYMEABIGMQN, HLAB27   Coagulation Lab Results  Component Value Date   PLT 337 08/17/2019     Cardiovascular Lab Results  Component Value Date   TROPONINI <0.03 01/09/2019   HGB 13.1 08/17/2019   HCT 38.4 08/17/2019     Screening No results found for: SARSCOV2NAA, COVIDSOURCE, STAPHAUREUS, MRSAPCR, HCVAB, HIV, PREGTESTUR   Cancer No results found for: CEA, CA125, LABCA2   Allergens No results found for: ALMOND, APPLE, ASPARAGUS, AVOCADO, BANANA, BARLEY, BASIL, BAYLEAF, GREENBEAN, LIMABEAN, WHITEBEAN, BEEFIGE, REDBEET, BLUEBERRY, BROCCOLI, CABBAGE, MELON, CARROT, CASEIN, CASHEWNUT, CAULIFLOWER, CELERY     Note: Lab results reviewed.   Imaging Review   Results for orders placed during the hospital encounter of 11/06/15  DG Shoulder Right   Narrative CLINICAL DATA:  Right shoulder pain  EXAM: RIGHT SHOULDER - 2+ VIEW  COMPARISON:  None.  FINDINGS: There is no evidence of fracture or dislocation. There is no evidence of arthropathy or other focal bone abnormality. Soft tissues are unremarkable.  IMPRESSION: Negative.   Electronically Signed   By: Kerby Moors M.D.   On: 11/06/2015 19:21     Lumbar MR wo contrast:  Results for orders placed during the hospital encounter of 08/07/19  MR LUMBAR SPINE WO CONTRAST   Narrative CLINICAL DATA:  44 year old female with central and left side low back pain  radiating to the left hip, buttock, and the bilateral legs.  EXAM: MRI LUMBAR SPINE WITHOUT CONTRAST  TECHNIQUE: Multiplanar, multisequence MR imaging of the lumbar spine was performed. No intravenous contrast was administered.  COMPARISON:  Lumbar radiographs 03/24/2016. Report of lumbar radiographs 07/28/2019 (no images available).  FINDINGS: Segmentation:  Normal.  Alignment: Mild chronic levoconvex lumbar scoliosis. Stable lumbar lordosis since 2017. Subtle retrolisthesis of L5 on S1.  Vertebrae: Chronic degenerative endplate signal changes at L5-S1, mostly far lateral. Background bone marrow signal appears normal. No marrow edema or evidence of acute osseous abnormality. Intact visible sacrum and SI joints.  Conus medullaris and cauda equina: Conus extends to the T12-L1 level. No lower spinal cord or conus signal abnormality.  Paraspinal and other soft tissues: Negative.  Disc levels:  T11-T12: Mild facet hypertrophy on the left.  T12-L1:  Negative.  L1-L2:  Negative.  L2-L3:  Negative.  L3-L4: Minimal disc bulge and posterior element hypertrophy. No convincing stenosis.  L4-L5: Disc space loss. Circumferential disc bulge and endplate spurring eccentric to the right. Mild posterior element hypertrophy. No spinal or convincing lateral recess stenosis. Moderate right L4 foraminal stenosis (series 5, image 4).  L5-S1: Disc space loss. Circumferential disc bulge and endplate spurring. Mild-to-moderate left side facet hypertrophy. No spinal or lateral recess stenosis. Moderate left L5 foraminal stenosis (series 5, image 11). Borderline to mild right L5 foraminal stenosis.  IMPRESSION: 1.  Chronic disc and endplate degeneration at L4-L5 and L5-S1. No spinal or convincing lateral recess stenosis, although there is moderate foraminal stenosis at the right L4 and left L5 nerve levels. 2. Mild lumbar spine degeneration elsewhere. Underlying mild chronic levoconvex  scoliosis.   Electronically Signed   By: Genevie Ann M.D.   On: 08/07/2019 19:13     Results for orders placed during the hospital encounter of 08/17/19  CT Lumbar Spine Wo Contrast   Narrative CLINICAL DATA:  Chronic low back pain. Left leg numbness. Possible seizure today. No known injury.  EXAM: CT LUMBAR SPINE WITHOUT CONTRAST  TECHNIQUE: Multidetector CT imaging of the lumbar spine was performed without intravenous contrast administration. Multiplanar CT image reconstructions were also generated.  COMPARISON:  MRI lumbar spine 08/07/2019.  FINDINGS: Segmentation: Standard.  Alignment: Normal.  Vertebrae: No acute fracture or focal pathologic process.  Paraspinal and other soft tissues: Scattered atherosclerosis. No acute finding.  Disc levels: T12-L1: Negative.  L1-2, L2-3, L3-4: Negative.  L4-5: Vacuum disc phenomenon and a shallow bulge. Mild loss of disc space height. Mild facet arthropathy. Disc and endplate spur cause unchanged mild-to-moderate right foraminal narrowing. The central canal and left foramen are open.  L5-S1: Loss of disc space height with a shallow bulge and endplate spur. Left worse than right facet degenerative change. Central canal and right foramen are widely patent. Moderate left foraminal stenosis. No change.  IMPRESSION: No new abnormality since the patient's lumbar spine MRI 08/07/2019.  Mild to moderate right foraminal narrowing L4-5.  Moderate left foraminal narrowing L5-S1.   Electronically Signed   By: Inge Rise M.D.   On: 08/17/2019 18:26     Results for orders placed during the hospital encounter of 03/24/16  DG Lumbar Spine Complete   Narrative CLINICAL DATA:  Low back pain radiating into left buttock.  EXAM: LUMBAR SPINE - COMPLETE 4+ VIEW  COMPARISON:  None.  FINDINGS: No evidence of fracture or subluxation. Spondylosis present at L4-5 and L5-S1 consisting of mild disc space narrowing as well as  facet hypertrophy. No bony lesions or destruction identified.  IMPRESSION: Lumbar spondylosis at L4-5 and L5-S1.   Electronically Signed   By: Aletta Edouard M.D.   On: 03/24/2016 18:00            Sacroiliac Joint Imaging: Sacroiliac Joint DG:  Results for orders placed during the hospital encounter of 03/24/16  DG Si Joints   Narrative CLINICAL DATA:  Low back pain.  EXAM: BILATERAL SACROILIAC JOINTS - 3+ VIEW  COMPARISON:  None.  FINDINGS: The sacroiliac joints are symmetric. Minimal degenerative changes are seen involving inferior SI joints bilaterally. No erosions or bony destruction. No focal bony lesions are seen.  IMPRESSION: No fracture identified. Minimal degenerative changes of both SI joints.   Electronically Signed   By: Aletta Edouard M.D.   On: 03/24/2016 17:59    Results for orders placed during the hospital encounter of 07/11/16  DG Wrist Complete Right   Narrative CLINICAL DATA:  Pt with ulnar right wrist pain after straining it today lifting a large load of laundry. Pt hx of right wrist fracture with metal plate 2009. EXAM: RIGHT WRIST - COMPLETE 3+ VIEW COMPARISON:  None. FINDINGS: No acute fracture.  No dislocation. Or old fracture of the distal radius has been reduced with a volar fixation plate and screws. The orthopedic hardware is well-seated. Fracture is well healed. There is a small, old, associated ulnar styloid fracture. The joints are normally spaced  and aligned. No significant arthropathic change. Soft tissues are unremarkable. IMPRESSION: 1. No acute fracture. 2. No evidence of loosening of the distal radius orthopedic hardware. 3. No arthropathic change. Electronically Signed   By: Lajean Manes M.D.   On: 07/11/2016 19:27     Complexity Note: Imaging results reviewed. Results shared with Ms. Scheuermann, using Layman's terms.                         Meds   Current Outpatient Medications:  .  acetaminophen (TYLENOL)  500 MG tablet, Take 2 tablets (1,000 mg total) by mouth every 12 (twelve) hours as needed for moderate pain., Disp: 100 tablet, Rfl: 2 .  etodolac (LODINE) 400 MG tablet, Take by mouth., Disp: , Rfl:  .  rosuvastatin (CRESTOR) 40 MG tablet, Take 40 mg by mouth daily., Disp: , Rfl:  .  SUMAtriptan (IMITREX) 100 MG tablet, Take 50-100 mg as needed at headache onset. May repeat after 2 hours if needed., Disp: , Rfl:  .  DULoxetine (CYMBALTA) 60 MG capsule, Take 1 capsule (60 mg total) by mouth daily., Disp: 30 capsule, Rfl: 2 .  pregabalin (LYRICA) 25 MG capsule, Take 1 capsule (25 mg total) by mouth at bedtime for 20 days, THEN 2 capsules (50 mg total) at bedtime., Disp: 100 capsule, Rfl: 0  ROS  Constitutional: Denies any fever or chills Gastrointestinal: No reported hemesis, hematochezia, vomiting, or acute GI distress Musculoskeletal: Denies any acute onset joint swelling, redness, loss of ROM, or weakness Neurological: No reported episodes of acute onset apraxia, aphasia, dysarthria, agnosia, amnesia, paralysis, loss of coordination, or loss of consciousness  Allergies  Ms. Rojero has No Known Allergies.  [place]  Drug: Ms. Seefeld  reports no history of drug use. Alcohol:  reports current alcohol use. Tobacco:  reports that she has quit smoking. Her smoking use included cigarettes. She smoked 0.50 packs per day. She has never used smokeless tobacco. Medical:  has a past medical history of Migraines and SVT (supraventricular tachycardia) ([place]). Surgical: Ms. Stelzner  has a past surgical history that includes Abdominal hysterectomy; Cardiac surgery; Wrist surgery; and Cardiac electrophysiology mapping and ablation. Family: family history includes Anuerysm in her father; Hypertension in her mother; Osteoarthritis in her mother; Stroke in her mother.  Constitutional Exam  General appearance: Well nourished, well developed, and well hydrated. In no apparent acute distress Vitals:   04/11/20 1357   BP: 124/90  Pulse: 91  Temp: (!) 97.5 F (36.4 C)  SpO2: 92%  Weight: 148 lb (67.1 kg)  Height: 5' 4"  (1.626 m)   BMI Assessment: Estimated body mass index is 25.4 kg/m as calculated from the following:   Height as of this encounter: 5' 4"  (1.626 m).   Weight as of this encounter: 148 lb (67.1 kg).  BMI interpretation table: BMI level Category Range association with higher incidence of chronic pain  <18 kg/m2 Underweight   18.5-24.9 kg/m2 Ideal body weight   25-29.9 kg/m2 Overweight Increased incidence by 20%  30-34.9 kg/m2 Obese (Class I) Increased incidence by 68%  35-39.9 kg/m2 Severe obesity (Class II) Increased incidence by 136%  >40 kg/m2 Extreme obesity (Class III) Increased incidence by 254%   Patient's current BMI Ideal Body weight  Body mass index is 25.4 kg/m. Ideal body weight: 54.7 kg (120 lb 9.5 oz) Adjusted ideal body weight: 59.7 kg (131 lb 8.9 oz)   BMI Readings from Last 4 Encounters:  04/11/20 25.40 kg/m  02/15/20  26.61 kg/m  08/17/19 25.75 kg/m  07/20/19 25.75 kg/m   Wt Readings from Last 4 Encounters:  04/11/20 148 lb (67.1 kg)  02/15/20 155 lb (70.3 kg)  08/17/19 150 lb (68 kg)  07/20/19 150 lb (68 kg)    Psych/Mental status: Alert, oriented x 3 (person, place, & time)       Eyes: PERLA Respiratory: No evidence of acute respiratory distress  Cervical Spine Exam  Skin & Axial Inspection: No masses, redness, edema, swelling, or associated skin lesions Alignment: Symmetrical Functional ROM: Unrestricted ROM      Stability: No instability detected Muscle Tone/Strength: Functionally intact. No obvious neuro-muscular anomalies detected. Sensory (Neurological): Unimpaired Palpation: No palpable anomalies              Upper Extremity (UE) Exam    Side: Right upper extremity  Side: Left upper extremity  Skin & Extremity Inspection: Skin color, temperature, and hair growth are WNL. No peripheral edema or cyanosis. No masses, redness, swelling,  asymmetry, or associated skin lesions. No contractures.  Skin & Extremity Inspection: Skin color, temperature, and hair growth are WNL. No peripheral edema or cyanosis. No masses, redness, swelling, asymmetry, or associated skin lesions. No contractures.  Functional ROM: Unrestricted ROM          Functional ROM: Unrestricted ROM          Muscle Tone/Strength: Functionally intact. No obvious neuro-muscular anomalies detected.  Muscle Tone/Strength: Functionally intact. No obvious neuro-muscular anomalies detected.  Sensory (Neurological): Musculoskeletal pain pattern          Sensory (Neurological): Musculoskeletal pain pattern          Palpation: No palpable anomalies              Palpation: No palpable anomalies              Provocative Test(s):  Phalen's test: deferred Tinel's test: deferred Apley's scratch test (touch opposite shoulder):  Action 1 (Across chest): deferred Action 2 (Overhead): deferred Action 3 (LB reach): deferred   Provocative Test(s):  Phalen's test: deferred Tinel's test: deferred Apley's scratch test (touch opposite shoulder):  Action 1 (Across chest): deferred Action 2 (Overhead): deferred Action 3 (LB reach): deferred    Thoracic Spine Area Exam  Skin & Axial Inspection: No masses, redness, or swelling Alignment: Symmetrical Functional ROM: Unrestricted ROM Stability: No instability detected Muscle Tone/Strength: Functionally intact. No obvious neuro-muscular anomalies detected. Sensory (Neurological): Unimpaired Muscle strength & Tone: No palpable anomalies  Lumbar Exam  Skin & Axial Inspection: No masses, redness, or swelling Alignment: Symmetrical Functional ROM: Unrestricted ROM       Stability: No instability detected Muscle Tone/Strength: Functionally intact. No obvious neuro-muscular anomalies detected. Sensory (Neurological): Musculoskeletal pain pattern  Gait & Posture Assessment  Ambulation: Unassisted Gait: Relatively normal for age and  body habitus Posture: WNL   Lower Extremity Exam    Side: Right lower extremity  Side: Left lower extremity  Stability: No instability observed          Stability: No instability observed          Skin & Extremity Inspection: Skin color, temperature, and hair growth are WNL. No peripheral edema or cyanosis. No masses, redness, swelling, asymmetry, or associated skin lesions. No contractures.  Skin & Extremity Inspection: Skin color, temperature, and hair growth are WNL. No peripheral edema or cyanosis. No masses, redness, swelling, asymmetry, or associated skin lesions. No contractures.  Functional ROM: Unrestricted ROM  Functional ROM: Unrestricted ROM                  Muscle Tone/Strength: Functionally intact. No obvious neuro-muscular anomalies detected.  Muscle Tone/Strength: Functionally intact. No obvious neuro-muscular anomalies detected.  Sensory (Neurological): Unimpaired        Sensory (Neurological): Unimpaired        DTR: Patellar: deferred today Achilles: deferred today Plantar: deferred today  DTR: Patellar: deferred today Achilles: deferred today Plantar: deferred today  Palpation: No palpable anomalies  Palpation: No palpable anomalies   Assessment   Status Diagnosis  Controlled Controlled Controlled 1. Fibromyalgia   2. Psoriatic arthritis ([place])   3. Chronic migraine without aura without status migrainosus, not intractable   4. Chronic pain syndrome      Updated Problems: Problem  Chronic Migraine Without Aura Without Status Migrainosus, Not Intractable    Plan of Care  Pharmacotherapy (Medications Ordered): Meds ordered this encounter  Medications  . DULoxetine (CYMBALTA) 60 MG capsule    Sig: Take 1 capsule (60 mg total) by mouth daily.    Dispense:  30 capsule    Refill:  2  . pregabalin (LYRICA) 25 MG capsule    Sig: Take 1 capsule (25 mg total) by mouth at bedtime for 20 days, THEN 2 capsules (50 mg total) at bedtime.    Dispense:   100 capsule    Refill:  0   Medications administered today: Julaine Hua had no medications administered during this visit.  Orders:  Orders Placed This Encounter  Procedures  . Ambulatory referral to Rheumatology    Referral Priority:   Routine    Referral Type:   Consultation    Referral Reason:   Specialty Services Required    Requested Specialty:   Rheumatology    Number of Visits Requested:   1    Referral Orders     Ambulatory referral to Rheumatology Planned follow-up:   Return in about 8 weeks (around 06/06/2020) for Medication Management, in person.   Recent Visits Date Type Provider Dept  02/16/20 Office Visit Gillis Santa, MD [facility]  Showing recent visits within past 90 days and meeting all other requirements   Today's Visits Date Type Provider Dept  04/11/20 Office Visit Gillis Santa, MD [facility]  Showing today's visits and meeting all other requirements   Future Appointments Date Type Provider Dept  06/05/20 Appointment Gillis Santa, MD [facility]  Showing future appointments within next 90 days and meeting all other requirements   Primary Care Physician: Ricardo Jericho, NP Location: [facility] Note by: Gillis Santa, MD Date: 04/11/2020; Time: 2:49 PM  Note: This dictation was prepared with Dragon dictation. Any transcriptional errors that may result from this process are unintentional.

## 2020-05-01 ENCOUNTER — Other Ambulatory Visit: Payer: Self-pay | Admitting: Student in an Organized Health Care Education/Training Program

## 2020-05-01 DIAGNOSIS — M797 Fibromyalgia: Secondary | ICD-10-CM

## 2020-05-07 ENCOUNTER — Other Ambulatory Visit: Payer: Self-pay | Admitting: Student in an Organized Health Care Education/Training Program

## 2020-05-07 DIAGNOSIS — M797 Fibromyalgia: Secondary | ICD-10-CM

## 2020-05-07 DIAGNOSIS — G894 Chronic pain syndrome: Secondary | ICD-10-CM

## 2020-06-05 ENCOUNTER — Encounter: Payer: BC Managed Care – PPO | Admitting: Student in an Organized Health Care Education/Training Program

## 2020-06-20 ENCOUNTER — Telehealth: Payer: Self-pay | Admitting: *Deleted

## 2020-06-26 ENCOUNTER — Telehealth: Payer: Self-pay

## 2020-06-26 DIAGNOSIS — M797 Fibromyalgia: Secondary | ICD-10-CM

## 2020-06-26 DIAGNOSIS — G894 Chronic pain syndrome: Secondary | ICD-10-CM

## 2020-06-26 MED ORDER — PREGABALIN 25 MG PO CAPS: 50.0000 mg | ORAL_CAPSULE | Freq: Every day | ORAL | 0 refills | Status: DC

## 2020-06-26 NOTE — Telephone Encounter (Signed)
She has an appt on 7/29 and is out of Lyrica. She wants to know if he can call out enough to last until her appointment.

## 2020-06-26 NOTE — Telephone Encounter (Signed)
Patient notified

## 2020-07-12 ENCOUNTER — Other Ambulatory Visit: Payer: Self-pay

## 2020-07-12 ENCOUNTER — Encounter: Payer: Self-pay | Admitting: Student in an Organized Health Care Education/Training Program

## 2020-07-12 ENCOUNTER — Ambulatory Visit
Payer: BC Managed Care – PPO | Attending: Student in an Organized Health Care Education/Training Program | Admitting: Student in an Organized Health Care Education/Training Program

## 2020-07-12 VITALS — BP 126/96 | HR 76 | Temp 97.3°F | Resp 16 | Ht 64.0 in | Wt 144.0 lb

## 2020-07-12 DIAGNOSIS — G894 Chronic pain syndrome: Secondary | ICD-10-CM | POA: Diagnosis not present

## 2020-07-12 DIAGNOSIS — M5416 Radiculopathy, lumbar region: Secondary | ICD-10-CM | POA: Insufficient documentation

## 2020-07-12 DIAGNOSIS — L4050 Arthropathic psoriasis, unspecified: Secondary | ICD-10-CM | POA: Insufficient documentation

## 2020-07-12 DIAGNOSIS — M797 Fibromyalgia: Secondary | ICD-10-CM | POA: Insufficient documentation

## 2020-07-12 DIAGNOSIS — G43709 Chronic migraine without aura, not intractable, without status migrainosus: Secondary | ICD-10-CM | POA: Insufficient documentation

## 2020-07-12 MED ORDER — DULOXETINE HCL 60 MG PO CPEP: 60.0000 mg | ORAL_CAPSULE | Freq: Every day | ORAL | 2 refills | Status: DC

## 2020-07-12 MED ORDER — PREGABALIN 25 MG PO CAPS: 50.0000 mg | ORAL_CAPSULE | Freq: Every day | ORAL | 2 refills | Status: DC

## 2020-07-12 NOTE — Progress Notes (Signed)
PROVIDER NOTE: Information contained herein reflects review and annotations entered in association with encounter. Interpretation of such information and data should be left to medically-trained personnel. Information provided to patient can be located elsewhere in the medical record under "Patient Instructions". Document created using STT-dictation technology, any transcriptional errors that may result from process are unintentional.    Patient: Sandra Doyle  Service Category: E/M  Provider: Gillis Santa, MD  DOB: 1975-12-19  DOS: 07/12/2020  Specialty: Interventional Pain Management  MRN: [id number]  Setting: Ambulatory outpatient  PCP: Valera Castle, MD  Type: Established Patient    Referring Provider: Ricardo Jericho*  Location: Office  Delivery: Face-to-face     HPI  Reason for encounter: Sandra Doyle, a 44 y.o. year old female, is here today for evaluation and management of her Psoriatic arthritis ([place]) [L40.50]. Sandra Doyle primary complain today is Pain (fibromyalgia) Last encounter: Practice (06/26/2020). My last encounter with her was on 05/07/2020. Pertinent problems: Sandra Doyle has Left lumbar radiculopathy; Sacral back pain; Fibromyalgia; Psoriatic arthritis ([place]); Chronic pain syndrome; and Chronic migraine without aura without status migrainosus, not intractable on their pertinent problem list. Pain Assessment: Severity of Chronic pain is reported as a 2 /10. Location: Other (Comment) ("all over")  /denies. Onset: More than a month ago. Quality: Aching, Sharp, Throbbing. Timing: Intermittent. Modifying factor(s): nothing. Vitals:  height is 5' 4"  (1.626 m) and weight is 144 lb (65.3 kg). Her temporal temperature is 97.3 F (36.3 C) (abnormal). Her blood pressure is 126/96 (abnormal) and her pulse is 76. Her respiration is 16 and oxygen saturation is 100%.    Patient presents today for medication management in regards to her Cymbalta and Lyrica.  She states  that she is getting better sleep by taking Lyrica at night and also notes an improvement in her pain and mood with the Cymbalta.  She takes that during the day as she states that this can be activating for her.  She has been dealing with left Achilles tendon pain, she states that she had a punch biopsy done a couple of days ago with dermatology for which she is awaiting results.  She is also endorsing left elbow pain due to a fall that she sustained on 04/04/2020.  X-rays revealed left elbow radial head neck junction fracture which was nondisplaced.  At that visit, patient was also endorsing left shoulder pain for which they recommended an MRI of her left shoulder.  Patient has been participating in physical therapy..  She has been seen by orthopedics for this.  She states that the humidity has exacerbated her fibromyalgia pain.   ROS  Constitutional: Denies any fever or chills Gastrointestinal: No reported hemesis, hematochezia, vomiting, or acute GI distress Musculoskeletal: Diffuse myalgias and arthralgias Neurological: No reported episodes of acute onset apraxia, aphasia, dysarthria, agnosia, amnesia, paralysis, loss of coordination, or loss of consciousness  Medication Review  DULoxetine, acetaminophen, etodolac, fluticasone, loratadine, pregabalin, rosuvastatin, and triamcinolone cream  History Review  Allergy: Sandra Doyle has No Known Allergies. Drug: Sandra Doyle  reports no history of drug use. Alcohol:  reports current alcohol use. Tobacco:  reports that she has quit smoking. Her smoking use included cigarettes. She smoked 0.50 packs per day. She has never used smokeless tobacco. Social: Sandra Doyle  reports that she has quit smoking. Her smoking use included cigarettes. She smoked 0.50 packs per day. She has never used smokeless tobacco. She reports current alcohol use. She reports that she does not use drugs.  Medical:  has a past medical history of Migraines and SVT (supraventricular  tachycardia) ([place]). Surgical: Sandra Doyle  has a past surgical history that includes Abdominal hysterectomy; Cardiac surgery; Wrist surgery; and Cardiac electrophysiology mapping and ablation. Family: family history includes Anuerysm in her father; Hypertension in her mother; Osteoarthritis in her mother; Stroke in her mother.  Laboratory Chemistry Profile   Renal Lab Results  Component Value Date   BUN 16 08/17/2019   CREATININE 0.98 08/17/2019   GFRAA >60 08/17/2019   GFRNONAA >60 08/17/2019     Hepatic Lab Results  Component Value Date   AST 22 08/17/2019   ALT 21 08/17/2019   ALBUMIN 4.2 08/17/2019   ALKPHOS 88 08/17/2019     Electrolytes Lab Results  Component Value Date   NA 143 08/17/2019   K 3.2 (L) 08/17/2019   CL 107 08/17/2019   CALCIUM 10.2 08/17/2019     Bone No results found for: VD25OH, VD125OH2TOT, GX2119ER7, EY8144YJ8, 25OHVITD1, 25OHVITD2, 25OHVITD3, TESTOFREE, TESTOSTERONE   Inflammation (CRP: Acute Phase) (ESR: Chronic Phase) No results found for: CRP, ESRSEDRATE, LATICACIDVEN     Note: Above Lab results reviewed.  Recent Imaging Review  CT Lumbar Spine Wo Contrast CLINICAL DATA:  Chronic low back pain. Left leg numbness. Possible seizure today. No known injury.  EXAM: CT LUMBAR SPINE WITHOUT CONTRAST  TECHNIQUE: Multidetector CT imaging of the lumbar spine was performed without intravenous contrast administration. Multiplanar CT image reconstructions were also generated.  COMPARISON:  MRI lumbar spine 08/07/2019.  FINDINGS: Segmentation: Standard.  Alignment: Normal.  Vertebrae: No acute fracture or focal pathologic process.  Paraspinal and other soft tissues: Scattered atherosclerosis. No acute finding.  Disc levels: T12-L1: Negative.  L1-2, L2-3, L3-4: Negative.  L4-5: Vacuum disc phenomenon and a shallow bulge. Mild loss of disc space height. Mild facet arthropathy. Disc and endplate spur cause unchanged mild-to-moderate  right foraminal narrowing. The central canal and left foramen are open.  L5-S1: Loss of disc space height with a shallow bulge and endplate spur. Left worse than right facet degenerative change. Central canal and right foramen are widely patent. Moderate left foraminal stenosis. No change.  IMPRESSION: No new abnormality since the patient's lumbar spine MRI 08/07/2019.  Mild to moderate right foraminal narrowing L4-5.  Moderate left foraminal narrowing L5-S1.  Electronically Signed   By: Inge Rise M.D.   On: 08/17/2019 18:26 Note: Reviewed        Physical Exam  General appearance: Well nourished, well developed, and well hydrated. In no apparent acute distress Mental status: Alert, oriented x 3 (person, place, & time)       Respiratory: No evidence of acute respiratory distress Eyes: PERLA Vitals: BP (!) 126/96    Pulse 76    Temp (!) 97.3 F (36.3 C) (Temporal)    Resp 16    Ht 5' 4"  (1.626 m)    Wt 144 lb (65.3 kg)    SpO2 100%    BMI 24.72 kg/m  BMI: Estimated body mass index is 24.72 kg/m as calculated from the following:   Height as of this encounter: 5' 4"  (1.626 m).   Weight as of this encounter: 144 lb (65.3 kg). Ideal: Ideal body weight: 54.7 kg (120 lb 9.5 oz) Adjusted ideal body weight: 58.9 kg (129 lb 15.3 oz)   Cervical Spine Exam  Skin & Axial Inspection: No masses, redness, edema, swelling, or associated skin lesions Alignment: Symmetrical Functional ROM: Unrestricted ROM      Stability: No instability  detected Muscle Tone/Strength: Functionally intact. No obvious neuro-muscular anomalies detected. Sensory (Neurological): Unimpaired Palpation: No palpable anomalies                    Upper Extremity (UE) Exam    Side: Right upper extremity  Side: Left upper extremity   Skin & Extremity Inspection: Skin color, temperature, and hair growth are WNL. No peripheral edema or cyanosis. No masses, redness, swelling, asymmetry, or associated skin  lesions. No contractures.  Skin & Extremity Inspection: Skin color, temperature, and hair growth are WNL. No peripheral edema or cyanosis. No masses, redness, swelling, asymmetry, or associated skin lesions. No contractures.   Functional ROM: Unrestricted ROM          Functional ROM: Unrestricted ROM           Muscle Tone/Strength: Functionally intact. No obvious neuro-muscular anomalies detected.  Muscle Tone/Strength: Functionally intact. No obvious neuro-muscular anomalies detected.   Sensory (Neurological): Musculoskeletal pain pattern          Sensory (Neurological): Musculoskeletal pain pattern           Palpation: No palpable anomalies              Palpation: No palpable anomalies               Provocative Test(s):  Phalen's test: deferred Tinel's test: deferred Apley's scratch test (touch opposite shoulder):  Action 1 (Across chest): deferred Action 2 (Overhead): deferred Action 3 (LB reach): deferred   Provocative Test(s):  Phalen's test: deferred Tinel's test: deferred Apley's scratch test (touch opposite shoulder):  Action 1 (Across chest): deferred Action 2 (Overhead): deferred Action 3 (LB reach): deferred     Thoracic Spine Area Exam  Skin & Axial Inspection: No masses, redness, or swelling Alignment: Symmetrical Functional ROM: Unrestricted ROM Stability: No instability detected Muscle Tone/Strength: Functionally intact. No obvious neuro-muscular anomalies detected. Sensory (Neurological): Unimpaired Muscle strength & Tone: No palpable anomalies  Lumbar Exam  Skin & Axial Inspection: No masses, redness, or swelling Alignment: Symmetrical Functional ROM: Unrestricted ROM       Stability: No instability detected Muscle Tone/Strength: Functionally intact. No obvious neuro-muscular anomalies detected. Sensory (Neurological): Musculoskeletal pain pattern  Gait & Posture Assessment  Ambulation: Unassisted Gait: Relatively normal for age and body  habitus Posture: WNL   Lower Extremity Exam    Side: Right lower extremity  Side: Left lower extremity  Stability: No instability observed          Stability: No instability observed          Skin & Extremity Inspection: Skin color, temperature, and hair growth are WNL. No peripheral edema or cyanosis. No masses, redness, swelling, asymmetry, or associated skin lesions. No contractures.  Skin & Extremity Inspection: foot in boot  Functional ROM: Unrestricted ROM                  Functional ROM:  Pain restricted ROM                  Muscle Tone/Strength: Functionally intact. No obvious neuro-muscular anomalies detected.  Muscle Tone/Strength: Functionally intact. No obvious neuro-muscular anomalies detected.  Sensory (Neurological): Unimpaired        Sensory (Neurological): Unimpaired        DTR: Patellar: deferred today Achilles: deferred today Plantar: deferred today  DTR: Patellar: deferred today Achilles: deferred today Plantar: deferred today  Palpation: No palpable anomalies  Palpation: No palpable anomalies     Assessment  Status Diagnosis  Controlled Controlled Controlled 1. Psoriatic arthritis ([place])   2. Fibromyalgia   3. Chronic pain syndrome   4. Chronic migraine without aura without status migrainosus, not intractable   5. Left lumbar radiculopathy (Left L4/5 and Left L5/S1)      Updated Problems: Problem  Chronic Migraine Without Aura Without Status Migrainosus, Not Intractable  Fibromyalgia  Psoriatic Arthritis ([facility])  Chronic Pain Syndrome  Left Lumbar Radiculopathy  Sacral Back Pain    Plan of Care  Sandra Doyle has a current medication list which includes the following long-term medication(s): pregabalin, rosuvastatin, duloxetine, and loratadine.  Pharmacotherapy (Medications Ordered): Meds ordered this encounter  Medications   DULoxetine (CYMBALTA) 60 MG capsule    Sig: Take 1 capsule (60 mg total) by mouth daily.     Dispense:  30 capsule    Refill:  2   pregabalin (LYRICA) 25 MG capsule    Sig: Take 2 capsules (50 mg total) by mouth at bedtime.    Dispense:  60 capsule    Refill:  2   Follow-up plan:   Return in about 3 months (around 10/12/2020) for Medication Management, in person.   Recent Visits No visits were found meeting these conditions. Showing recent visits within past 90 days and meeting all other requirements Today's Visits Date Type Provider Dept  07/12/20 Office Visit Gillis Santa, MD [facility]  Showing today's visits and meeting all other requirements Future Appointments No visits were found meeting these conditions. Showing future appointments within next 90 days and meeting all other requirements  I discussed the assessment and treatment plan with the patient. The patient was provided an opportunity to ask questions and all were answered. The patient agreed with the plan and demonstrated an understanding of the instructions.  Patient advised to call back or seek an in-person evaluation if the symptoms or condition worsens.   Duration of encounter: 30 minutes.  Note by: Gillis Santa, MD Date: 07/12/2020; Time: 10:04 AM

## 2020-07-12 NOTE — Progress Notes (Signed)
Safety precautions to be maintained throughout the outpatient stay will include: orient to surroundings, keep bed in low position, maintain call bell within reach at all times, provide assistance with transfer out of bed and ambulation.  

## 2020-07-12 NOTE — Patient Instructions (Signed)
Prescriptions for Cymbalta and Lyrica have been sent to your pharmacy.

## 2020-08-07 NOTE — Progress Notes (Deleted)
Office Visit Note  Patient: Sandra Doyle             Date of Birth: 1976/04/18           MRN: [id number]             PCP: Dione Housekeeper, MD Referring: Edward Jolly, MD Visit Date: 08/21/2020 Occupation: @GUAROCC @  Subjective:  No chief complaint on file.   History of Present Illness: Sandra Doyle is a 44 y.o. female ***   Activities of Daily Living:  Patient reports morning stiffness for *** {minute/hour:19697}.   Patient {ACTIONS;DENIES/REPORTS:21021675::"Denies"} nocturnal pain.  Difficulty dressing/grooming: {ACTIONS;DENIES/REPORTS:21021675::"Denies"} Difficulty climbing stairs: {ACTIONS;DENIES/REPORTS:21021675::"Denies"} Difficulty getting out of chair: {ACTIONS;DENIES/REPORTS:21021675::"Denies"} Difficulty using hands for taps, buttons, cutlery, and/or writing: {ACTIONS;DENIES/REPORTS:21021675::"Denies"}  No Rheumatology ROS completed.   PMFS History:  Patient Active Problem List   Diagnosis Date Noted  . Chronic migraine without aura without status migrainosus, not intractable 04/11/2020  . Fibromyalgia 02/16/2020  . Psoriatic arthritis ([facility]) 02/16/2020  . Chronic pain syndrome 02/16/2020  . Left lumbar radiculopathy 04/17/2016  . Sacral back pain 04/17/2016    Past Medical History:  Diagnosis Date  . Migraines   . SVT (supraventricular tachycardia) ([facility])     Family History  Problem Relation Age of Onset  . Hypertension Mother   . Stroke Mother   . Osteoarthritis Mother   . Anuerysm Father    Past Surgical History:  Procedure Laterality Date  . ABDOMINAL HYSTERECTOMY    . CARDIAC ELECTROPHYSIOLOGY MAPPING AND ABLATION    . CARDIAC SURGERY     ablasion  . WRIST SURGERY     Social History   Social History Narrative  . Not on file   Immunization History  Administered Date(s) Administered  . PFIZER SARS-COV-2 Vaccination 02/23/2020, 03/20/2020     Objective: Vital Signs: There were no vitals taken for this visit.    Physical Exam   Musculoskeletal Exam: ***  CDAI Exam: CDAI Score: -- Patient Global: --; Provider Global: -- Swollen: --; Tender: -- Joint Exam 08/21/2020   No joint exam has been documented for this visit   There is currently no information documented on the homunculus. Go to the Rheumatology activity and complete the homunculus joint exam.  Investigation: No additional findings.  Imaging: No results found.  Recent Labs: Lab Results  Component Value Date   WBC 9.9 08/17/2019   HGB 13.1 08/17/2019   PLT 337 08/17/2019   NA 143 08/17/2019   K 3.2 (L) 08/17/2019   CL 107 08/17/2019   CO2 26 08/17/2019   GLUCOSE 95 08/17/2019   BUN 16 08/17/2019   CREATININE 0.98 08/17/2019   BILITOT 0.4 08/17/2019   ALKPHOS 88 08/17/2019   AST 22 08/17/2019   ALT 21 08/17/2019   PROT 8.1 08/17/2019   ALBUMIN 4.2 08/17/2019   CALCIUM 10.2 08/17/2019   GFRAA >60 08/17/2019    Speciality Comments: No specialty comments available.  Procedures:  No procedures performed Allergies: Patient has no known allergies.   Assessment / Plan:     Visit Diagnoses: No diagnosis found.  Orders: No orders of the defined types were placed in this encounter.  No orders of the defined types were placed in this encounter.   Face-to-face time spent with patient was *** minutes. Greater than 50% of time was spent in counseling and coordination of care.  Follow-Up Instructions: No follow-ups on file.   10/17/2019, PA-C  Note - This record has been created using  Editor, commissioning.  Chart creation errors have been sought, but may not always  have been located. Such creation errors do not reflect on  the standard of medical care.

## 2020-08-21 ENCOUNTER — Ambulatory Visit: Payer: BC Managed Care – PPO | Admitting: Rheumatology

## 2020-09-11 ENCOUNTER — Emergency Department: Admission: EM | Admit: 2020-09-11 | Payer: BC Managed Care – PPO | Source: Home / Self Care

## 2020-09-12 ENCOUNTER — Ambulatory Visit: Payer: BC Managed Care – PPO | Admitting: Rheumatology

## 2020-10-11 ENCOUNTER — Ambulatory Visit
Payer: BC Managed Care – PPO | Attending: Student in an Organized Health Care Education/Training Program | Admitting: Student in an Organized Health Care Education/Training Program

## 2020-10-11 ENCOUNTER — Encounter: Payer: Self-pay | Admitting: Student in an Organized Health Care Education/Training Program

## 2020-10-11 ENCOUNTER — Other Ambulatory Visit: Payer: Self-pay

## 2020-10-11 ENCOUNTER — Other Ambulatory Visit: Payer: Self-pay | Admitting: Student in an Organized Health Care Education/Training Program

## 2020-10-11 VITALS — BP 122/93 | HR 97 | Temp 97.7°F | Ht 64.0 in | Wt 145.0 lb

## 2020-10-11 DIAGNOSIS — M797 Fibromyalgia: Secondary | ICD-10-CM | POA: Diagnosis not present

## 2020-10-11 DIAGNOSIS — L4050 Arthropathic psoriasis, unspecified: Secondary | ICD-10-CM | POA: Diagnosis not present

## 2020-10-11 DIAGNOSIS — G894 Chronic pain syndrome: Secondary | ICD-10-CM | POA: Diagnosis present

## 2020-10-11 DIAGNOSIS — G43709 Chronic migraine without aura, not intractable, without status migrainosus: Secondary | ICD-10-CM | POA: Diagnosis not present

## 2020-10-11 DIAGNOSIS — M5416 Radiculopathy, lumbar region: Secondary | ICD-10-CM | POA: Diagnosis present

## 2020-10-11 MED ORDER — DULOXETINE HCL 60 MG PO CPEP: 60.0000 mg | ORAL_CAPSULE | Freq: Every day | ORAL | 3 refills | Status: DC

## 2020-10-11 MED ORDER — PREGABALIN 50 MG PO CAPS: 50.0000 mg | ORAL_CAPSULE | Freq: Every evening | ORAL | 3 refills | Status: DC

## 2020-10-11 NOTE — Progress Notes (Signed)
PROVIDER NOTE: Information contained herein reflects review and annotations entered in association with encounter. Interpretation of such information and data should be left to medically-trained personnel. Information provided to patient can be located elsewhere in the medical record under "Patient Instructions". Document created using STT-dictation technology, any transcriptional errors that may result from process are unintentional.    Patient: Sandra Doyle  Service Category: E/M  Provider: Gillis Santa, MD  DOB: [birth year]  DOS: 10/11/2020  Specialty: Interventional Pain Management  MRN: [id number]  Setting: Ambulatory outpatient  PCP: Valera Castle, MD  Type: Established Patient    Referring Provider: Valera Castle, *  Location: Office  Delivery: Face-to-face     HPI  Sandra Doyle, a 44 y.o. year old female, is here today because of her Psoriatic arthritis ([place]) [L40.50]. Sandra Doyle primary complain today is Generalized Body Aches Last encounter: My last encounter with her was on 07/12/2020. Pertinent problems: Sandra Doyle has Left lumbar radiculopathy; Sacral back pain; Fibromyalgia; Psoriatic arthritis ([place]); Chronic pain syndrome; and Chronic migraine without aura without status migrainosus, not intractable on their pertinent problem list. Pain Assessment: Severity of Chronic pain is reported as a 6 /10. Location: Generalized Right, Left, Lower/pain is everywhere. Onset: More than a month ago. Quality: Aching, Nagging, Tingling, Constant, Pins and needles, Shooting. Timing: Constant. Modifying factor(s): meds. Vitals:  height is 5' 4" (1.626 m) and weight is 145 lb (65.8 kg). Her temperature is 97.7 F (36.5 C). Her blood pressure is 122/93 (abnormal) and her pulse is 97. Her oxygen saturation is 100%.   Reason for encounter: medication management.   Diffuse musculoskeletal pain primarily in low back related to fibromyalgia.  Discussed the importance of  physical activity and stretching exercises.  Encourage aquatic therapy. Patient has experiencing rectal bleeding secondary to hemorrhoids, has been evaluated by GI specialist Dr. Haig Prophet.  Has upcoming colonoscopy. refill Lyrica and Cymbalta as below.  No change in dose.  ROS  Constitutional: Denies any fever or chills Gastrointestinal: No reported hemesis, hematochezia, vomiting, or acute GI distress Musculoskeletal: Diffuse musculoskeletal pain Neurological: No reported episodes of acute onset apraxia, aphasia, dysarthria, agnosia, amnesia, paralysis, loss of coordination, or loss of consciousness  Medication Review  DULoxetine, acetaminophen, fluticasone, loratadine, pregabalin, rosuvastatin, and triamcinolone cream  History Review  Allergy: Sandra Doyle has No Known Allergies. Drug: Sandra Doyle  reports no history of drug use. Alcohol:  reports current alcohol use. Tobacco:  reports that she has quit smoking. Her smoking use included cigarettes. She smoked 0.50 packs per day. She has never used smokeless tobacco. Social: Sandra Doyle  reports that she has quit smoking. Her smoking use included cigarettes. She smoked 0.50 packs per day. She has never used smokeless tobacco. She reports current alcohol use. She reports that she does not use drugs. Medical:  has a past medical history of Migraines and SVT (supraventricular tachycardia) ([place]). Surgical: Sandra Doyle  has a past surgical history that includes Abdominal hysterectomy; Cardiac surgery; Wrist surgery; and Cardiac electrophysiology mapping and ablation. Family: family history includes Anuerysm in her father; Hypertension in her mother; Osteoarthritis in her mother; Stroke in her mother.  Laboratory Chemistry Profile   Renal Lab Results  Component Value Date   BUN 16 08/17/2019   CREATININE 0.98 08/17/2019   GFRAA >60 08/17/2019   GFRNONAA >60 08/17/2019     Hepatic Lab Results  Component Value Date   AST 22 08/17/2019   ALT 21  08/17/2019   ALBUMIN 4.2 08/17/2019  ALKPHOS 88 08/17/2019     Electrolytes Lab Results  Component Value Date   NA 143 08/17/2019   K 3.2 (L) 08/17/2019   CL 107 08/17/2019   CALCIUM 10.2 08/17/2019     Bone No results found for: VD25OH, VD125OH2TOT, EH2094BS9, GG8366QH4, 25OHVITD1, 25OHVITD2, 25OHVITD3, TESTOFREE, TESTOSTERONE   Inflammation (CRP: Acute Phase) (ESR: Chronic Phase) No results found for: CRP, ESRSEDRATE, LATICACIDVEN     Note: Above Lab results reviewed.  Recent Imaging Review  CT Lumbar Spine Wo Contrast CLINICAL DATA:  Chronic low back pain. Left leg numbness. Possible seizure today. No known injury.  EXAM: CT LUMBAR SPINE WITHOUT CONTRAST  TECHNIQUE: Multidetector CT imaging of the lumbar spine was performed without intravenous contrast administration. Multiplanar CT image reconstructions were also generated.  COMPARISON:  MRI lumbar spine 08/07/2019.  FINDINGS: Segmentation: Standard.  Alignment: Normal.  Vertebrae: No acute fracture or focal pathologic process.  Paraspinal and other soft tissues: Scattered atherosclerosis. No acute finding.  Disc levels: T12-L1: Negative.  L1-2, L2-3, L3-4: Negative.  L4-5: Vacuum disc phenomenon and a shallow bulge. Mild loss of disc space height. Mild facet arthropathy. Disc and endplate spur cause unchanged mild-to-moderate right foraminal narrowing. The central canal and left foramen are open.  L5-S1: Loss of disc space height with a shallow bulge and endplate spur. Left worse than right facet degenerative change. Central canal and right foramen are widely patent. Moderate left foraminal stenosis. No change.  IMPRESSION: No new abnormality since the patient's lumbar spine MRI 08/07/2019.  Mild to moderate right foraminal narrowing L4-5.  Moderate left foraminal narrowing L5-S1.  Electronically Signed   By: Inge Rise M.D.   On: 08/17/2019 18:26 Note: Reviewed        Physical Exam   General appearance: Well nourished, well developed, and well hydrated. In no apparent acute distress Mental status: Alert, oriented x 3 (person, place, & time)       Respiratory: No evidence of acute respiratory distress Eyes: PERLA Vitals: BP (!) 122/93   Pulse 97   Temp 97.7 F (36.5 C)   Ht 5' 4" (1.626 m)   Wt 145 lb (65.8 kg)   SpO2 100%   BMI 24.89 kg/m  BMI: Estimated body mass index is 24.89 kg/m as calculated from the following:   Height as of this encounter: 5' 4" (1.626 m).   Weight as of this encounter: 145 lb (65.8 kg). Ideal: Ideal body weight: 54.7 kg (120 lb 9.5 oz) Adjusted ideal body weight: 59.1 kg (130 lb 5.7 oz)  Diffuse musculoskeletal pain  Assessment   Status Diagnosis  Controlled Controlled Controlled 1. Psoriatic arthritis ([place])   2. Fibromyalgia   3. Chronic pain syndrome   4. Chronic migraine without aura without status migrainosus, not intractable   5. Left lumbar radiculopathy (Left L4/5 and Left L5/S1)       Plan of Care  Sandra Doyle has a current medication list which includes the following long-term medication(s): rosuvastatin, duloxetine, loratadine, and pregabalin.  Pharmacotherapy (Medications Ordered): Meds ordered this encounter  Medications  . DULoxetine (CYMBALTA) 60 MG capsule    Sig: Take 1 capsule (60 mg total) by mouth daily.    Dispense:  30 capsule    Refill:  3  . pregabalin (LYRICA) 50 MG capsule    Sig: Take 1 capsule (50 mg total) by mouth at bedtime.    Dispense:  90 capsule    Refill:  3    Fill one day early  if pharmacy is closed on scheduled refill date. May substitute for generic if available.   Follow-up plan:   Return in about 4 months (around 02/11/2021) for Medication Management, in person.   Recent Visits No visits were found meeting these conditions. Showing recent visits within past 90 days and meeting all other requirements Today's Visits Date Type Provider Dept  10/11/20 Office Visit  Gillis Santa, MD [facility]  Showing today's visits and meeting all other requirements Future Appointments No visits were found meeting these conditions. Showing future appointments within next 90 days and meeting all other requirements  I discussed the assessment and treatment plan with the patient. The patient was provided an opportunity to ask questions and all were answered. The patient agreed with the plan and demonstrated an understanding of the instructions.  Patient advised to call back or seek an in-person evaluation if the symptoms or condition worsens.  Duration of encounter: 30 minutes.  Note by: Gillis Santa, MD Date: 10/11/2020; Time: 9:51 AM

## 2020-10-11 NOTE — Progress Notes (Signed)
Safety precautions to be maintained throughout the outpatient stay will include: orient to surroundings, keep bed in low position, maintain call bell within reach at all times, provide assistance with transfer out of bed and ambulation.  

## 2021-02-07 ENCOUNTER — Other Ambulatory Visit: Payer: Self-pay

## 2021-02-07 ENCOUNTER — Encounter: Payer: Self-pay | Admitting: Student in an Organized Health Care Education/Training Program

## 2021-02-07 ENCOUNTER — Ambulatory Visit
Payer: Self-pay | Attending: Student in an Organized Health Care Education/Training Program | Admitting: Student in an Organized Health Care Education/Training Program

## 2021-02-07 DIAGNOSIS — M533 Sacrococcygeal disorders, not elsewhere classified: Secondary | ICD-10-CM

## 2021-02-07 DIAGNOSIS — M5416 Radiculopathy, lumbar region: Secondary | ICD-10-CM

## 2021-02-07 DIAGNOSIS — G894 Chronic pain syndrome: Secondary | ICD-10-CM

## 2021-02-07 DIAGNOSIS — M797 Fibromyalgia: Secondary | ICD-10-CM

## 2021-02-07 DIAGNOSIS — L4050 Arthropathic psoriasis, unspecified: Secondary | ICD-10-CM

## 2021-02-07 DIAGNOSIS — G43709 Chronic migraine without aura, not intractable, without status migrainosus: Secondary | ICD-10-CM

## 2021-02-07 MED ORDER — PREGABALIN 50 MG PO CAPS: 50.0000 mg | ORAL_CAPSULE | Freq: Two times a day (BID) | ORAL | 2 refills | Status: DC

## 2021-02-07 NOTE — Progress Notes (Signed)
Patient: Sandra Doyle  Service Category: E/M  Provider: Gillis Santa, MD  DOB: 12-Jan-1976  DOS: 02/07/2021  Location: Office  MRN: [id number]  Setting: Ambulatory outpatient  Referring Provider: Valera Castle, *  Type: Established Patient  Specialty: Interventional Pain Management  PCP: Valera Castle, MD  Location: Home  Delivery: TeleHealth     Virtual Encounter - Pain Management PROVIDER NOTE: Information contained herein reflects review and annotations entered in association with encounter. Interpretation of such information and data should be left to medically-trained personnel. Information provided to patient can be located elsewhere in the medical record under "Patient Instructions". Document created using STT-dictation technology, any transcriptional errors that may result from process are unintentional.    Contact & Pharmacy Preferred: [phone] Home: [phone] (home) Mobile: [phone] (mobile) E-mail: [email or web address]  [id number] [place] [address]Phone: [phone]: [phone]  Pre-screening  Ms. MJerelene Reddenoffered "in-person" vs "virtual" encounter. She indicated preferring virtual for this encounter.   Reason COVID-19*  Social distancing based on CDC and AMA recommendations.   I contacted CIlya Doyle 02/07/2021 via video conference.      I clearly identified myself as BGillis Santa MD. I verified that I was speaking with the correct person using two identifiers (Name: CChrysta Doyle and date of birth: [birth year].  Consent I sought verbal advanced consent from CLuther Parodyfor virtual visit interactions. I informed [name] possible security and privacy concerns, risks, and limitations associated with providing "not-in-person" medical evaluation and management services. I also informed [name] the availability of "in-person" appointments. Finally, I  informed her that there would be a charge for the virtual visit and that she could be  personally, fully or partially, financially responsible for it. Ms. MGraffeoexpressed understanding and agreed to proceed.   Historic Elements   [name] a 45y.o. year old, female patient evaluated today after our last contact on 10/11/2020. [name] has a past medical history of Migraines and SVT (supra[place]ricular tachycardia) [place]. She also  has a past surgical history that includes Abdominal hysterectomy; Cardiac surgery; Wrist surgery; and Cardiac electrophysiology mapping and ablation. Ms. MCowmanhas a current medication list which includes the following prescription(s): pregabalin, acetaminophen, duloxetine, fluticasone, loratadine, rosuvastatin, and triamcinolone cream. She  reports that she has quit smoking. Her smoking use included cigarettes. She smoked 0.50 packs per day. She has never used smokeless tobacco. She reports current alcohol use. She reports that she does not use drugs. Ms. MAtiyehhas No Known Allergies.   HPI  Today, she is being contacted for medication management.  Having bruising easily, without any inciting event. Recommend she follow up with PCP to check coags/ PT/INR/platelets.  Having increased pain during the day. Discussed increasing Lyrica to 50 mg. Also recommend she get Vit D and B-12 levels checked when she follows up with PCP for INR/platelets   Laboratory Chemistry Profile   Renal Lab Results  Component Value Date   BUN 16 08/17/2019   CREATININE 0.98 08/17/2019   GFRAA >60 08/17/2019   GFRNONAA >60 08/17/2019     Hepatic Lab Results  Component Value Date   AST 22 08/17/2019   ALT 21 08/17/2019   ALBUMIN 4.2 08/17/2019   ALKPHOS 88 08/17/2019     Electrolytes Lab Results  Component Value Date   NA 143 08/17/2019   K 3.2 (L) 08/17/2019  CL 107 08/17/2019   CALCIUM 10.2 08/17/2019     Bone No results found for: VD25OH, VD125OH2TOT,  [id number], [id number], 25OHVITD1, 25OHVITD2, 25OHVITD3, TESTOFREE, TESTOSTERONE   Inflammation (CRP: Acute Phase) (ESR: Chronic Phase) No results found for: CRP, ESRSEDRATE, LATICACIDVEN     Note: Above Lab results reviewed.  Imaging  CT Lumbar Spine Wo Contrast CLINICAL DATA:  Chronic low back pain. Left leg numbness. Possible seizure today. No known injury.  EXAM: CT LUMBAR SPINE WITHOUT CONTRAST  TECHNIQUE: Multidetector CT imaging of the lumbar spine was performed without intravenous contrast administration. Multiplanar CT image reconstructions were also generated.  COMPARISON:  MRI lumbar spine 08/07/2019.  FINDINGS: Segmentation: Standard.  Alignment: Normal.  Vertebrae: No acute fracture or focal pathologic process.  Paraspinal and other soft tissues: Scattered atherosclerosis. No acute finding.  Disc levels: T12-L1: Negative.  L1-2, L2-3, L3-4: Negative.  L4-5: Vacuum disc phenomenon and a shallow bulge. Mild loss of disc space height. Mild facet arthropathy. Disc and endplate spur cause unchanged mild-to-moderate right foraminal narrowing. The central canal and left foramen are open.  L5-S1: Loss of disc space height with a shallow bulge and endplate spur. Left worse than right facet degenerative change. Central canal and right foramen are widely patent. Moderate left foraminal stenosis. No change.  IMPRESSION: No new abnormality since the patient's lumbar spine MRI 08/07/2019.  Mild to moderate right foraminal narrowing L4-5.  Moderate left foraminal narrowing L5-S1.  Electronically Signed   By: Inge Rise M.D.   On: 08/17/2019 18:26  Assessment  The primary encounter diagnosis was Fibromyalgia. Diagnoses of Chronic pain syndrome, Psoriatic arthritis ([place]), Chronic migraine without aura without status migrainosus, not intractable, Left lumbar radiculopathy (Left L4/5 and Left L5/S1), and Sacral back pain were also pertinent to this  visit.  Plan of Care  [name] has a current medication list which includes the following long-term medication(s): duloxetine, loratadine, pregabalin, and rosuvastatin.  Pharmacotherapy (Medications Ordered): Meds ordered this encounter  Medications  . pregabalin (LYRICA) 50 MG capsule    Sig: Take 1 capsule (50 mg total) by mouth 2 (two) times daily.    Dispense:  60 capsule    Refill:  2    Fill one day early if pharmacy is closed on scheduled refill date. May substitute for generic if available.   Follow-up plan:   Return if symptoms worsen or fail to improve, for Medication Management.    Recent Visits No visits were found meeting these conditions. Showing recent visits within past 90 days and meeting all other requirements Today's Visits Date Type Provider Dept  02/07/21 Telemedicine Gillis Santa, MD [facility]  Showing today's visits and meeting all other requirements Future Appointments No visits were found meeting these conditions. Showing future appointments within next 90 days and meeting all other requirements  I discussed the assessment and treatment plan with the patient. The patient was provided an opportunity to ask questions and all were answered. The patient agreed with the plan and demonstrated an understanding of the instructions.  Patient advised to call back or seek an in-person evaluation if the symptoms or condition worsens.  Duration of encounter: 20 minutes.  Note by: Gillis Santa, MD Date: 02/07/2021; Time: 11:46 AM

## 2021-04-24 ENCOUNTER — Encounter: Payer: Self-pay | Admitting: Student in an Organized Health Care Education/Training Program

## 2021-04-24 ENCOUNTER — Ambulatory Visit
Payer: Self-pay | Attending: Student in an Organized Health Care Education/Training Program | Admitting: Student in an Organized Health Care Education/Training Program

## 2021-04-24 ENCOUNTER — Other Ambulatory Visit: Payer: Self-pay | Admitting: Student in an Organized Health Care Education/Training Program

## 2021-04-24 ENCOUNTER — Other Ambulatory Visit: Payer: Self-pay

## 2021-04-24 VITALS — BP 134/87 | HR 84 | Temp 98.1°F | Resp 16 | Ht 64.0 in | Wt 147.0 lb

## 2021-04-24 DIAGNOSIS — R238 Other skin changes: Secondary | ICD-10-CM | POA: Insufficient documentation

## 2021-04-24 DIAGNOSIS — M797 Fibromyalgia: Secondary | ICD-10-CM | POA: Insufficient documentation

## 2021-04-24 DIAGNOSIS — R233 Spontaneous ecchymoses: Secondary | ICD-10-CM | POA: Insufficient documentation

## 2021-04-24 DIAGNOSIS — G894 Chronic pain syndrome: Secondary | ICD-10-CM | POA: Insufficient documentation

## 2021-04-24 MED ORDER — PREGABALIN 75 MG PO CAPS: 75.0000 mg | ORAL_CAPSULE | Freq: Two times a day (BID) | ORAL | 3 refills | Status: DC

## 2021-04-24 MED ORDER — DULOXETINE HCL 60 MG PO CPEP: 60.0000 mg | ORAL_CAPSULE | Freq: Every day | ORAL | 3 refills | Status: DC

## 2021-04-24 NOTE — Progress Notes (Signed)
PROVIDER NOTE: Information contained herein reflects review and annotations entered in association with encounter. Interpretation of such information and data should be left to medically-trained personnel. Information provided to patient can be located elsewhere in the medical record under "Patient Instructions". Document created using STT-dictation technology, any transcriptional errors that may result from process are unintentional.    Patient: Sandra Doyle  Service Category: E/M  Provider: Gillis Santa, MD  DOB: 1976-07-07  DOS: 04/24/2021  Specialty: Interventional Pain Management  MRN: [id number]  Setting: Ambulatory outpatient  PCP: Sandra Castle, MD  Type: Established Patient    Referring Provider: Valera Doyle, *  Location: Office  Delivery: Face-to-face     HPI  [name], a 45 y.o. year old female, is here today because of her Fibromyalgia [M79.7]. [name] primary complain today is Leg Pain (bilateral), Hip Pain (bilateral), Foot Pain (bilateral), and Back Pain (Bra line) Last encounter: My last encounter with her was on Visit date not found. Pertinent problems: Ms. Tidd has Left lumbar radiculopathy; Sacral back pain; Fibromyalgia; Psoriatic arthritis ([place]); Chronic pain syndrome; and Chronic migraine without aura without status migrainosus, not intractable on their pertinent problem list. Pain Assessment: Severity of Chronic pain is reported as a 7 /10. Location: Leg Right,Left/radiates throughout entire body. Onset: More than a month ago. Quality: Radiating,Squeezing,Aching. Timing: Constant. Modifying factor(s): denies. Vitals:  height is 5' 4" (1.626 m) and weight is 147 lb (66.7 kg). Her temperature is 98.1 F (36.7 C). Her blood pressure is 134/87 and her pulse is 84. Her respiration is 16 and oxygen saturation is 100%.   Reason for encounter: medication management.    No significant change in her medical history.  Patient states that she  has been bruising more even with a small cut.  Her platelet count was normal.  Today we will check a PT and INR.  Otherwise no significant change in her medical history.  She has slightly elevated liver enzymes which is being followed by primary care provider and GI.  She had a small colonic polyp removed.  Otherwise she complains of increased myalgias and arthralgias.  This is related to fibromyalgia.  We discussed the importance of physical exercise and activity.  I will have her increase her Lyrica to 75 mg twice a day.  She is instructed to continue tizanidine as needed for muscle spasms.  I will also refill her Cymbalta 60 mg daily.  I instructed patient to touch base with [place] in approximately 1 month to assess her response to Lyrica and can consider escalation to 100 mg twice daily depending upon noted benefits and or side effects.   ROS  Constitutional: Denies any fever or chills Gastrointestinal: No reported hemesis, hematochezia, vomiting, or acute GI distress Musculoskeletal: Polyarthralgias, polymyalgias, low back shoulder knees Neurological: No reported episodes of acute onset apraxia, aphasia, dysarthria, agnosia, amnesia, paralysis, loss of coordination, or loss of consciousness  Medication Review  DULoxetine, EPINEPHrine, Ruxolitinib Phosphate, SUMAtriptan, Sod Fluoride-Potassium Nitrate, acetaminophen, cetirizine, chlorhexidine, clobetasol ointment, fluticasone, linaclotide, pregabalin, rosuvastatin, tacrolimus, tiZANidine, and triamcinolone cream  History Review  Allergy: [name] has No Known Allergies. Drug: [name]  reports no history of drug use. Alcohol:  reports current alcohol use. Tobacco:  reports that she has quit smoking. Her smoking use included cigarettes. She smoked 0.50 packs per day. She has never used smokeless tobacco. Social: [name]  reports that she has quit smoking. Her smoking use included cigarettes. She smoked 0.50 packs per  day. She has never used  smokeless tobacco. She reports current alcohol use. She reports that she does not use drugs. Medical:  has a past medical history of Chronic kidney disease, Migraines, and SVT (supraventricular tachycardia) ([place]). Surgical: [name]  has a past surgical history that includes Abdominal hysterectomy; Cardiac surgery; Wrist surgery; and Cardiac electrophysiology mapping and ablation. Family: family history includes Anuerysm in her father; Hypertension in her mother; Osteoarthritis in her mother; Stroke in her mother.  Laboratory Chemistry Profile   Renal Lab Results  Component Value Date   BUN 16 08/17/2019   CREATININE 0.98 08/17/2019   GFRAA >60 08/17/2019   GFRNONAA >60 08/17/2019     Hepatic Lab Results  Component Value Date   AST 22 08/17/2019   ALT 21 08/17/2019   ALBUMIN 4.2 08/17/2019   ALKPHOS 88 08/17/2019     Electrolytes Lab Results  Component Value Date   NA 143 08/17/2019   K 3.2 (L) 08/17/2019   CL 107 08/17/2019   CALCIUM 10.2 08/17/2019     Bone No results found for: VD25OH, VD125OH2TOT, ZO1096EA5, WU9811BJ4, 25OHVITD1, 25OHVITD2, 25OHVITD3, TESTOFREE, TESTOSTERONE   Inflammation (CRP: Acute Phase) (ESR: Chronic Phase) No results found for: CRP, ESRSEDRATE, LATICACIDVEN     Note: Above Lab results reviewed.  Recent Imaging Review  CT Lumbar Spine Wo Contrast CLINICAL DATA:  Chronic low back pain. Left leg numbness. Possible seizure today. No known injury.  EXAM: CT LUMBAR SPINE WITHOUT CONTRAST  TECHNIQUE: Multidetector CT imaging of the lumbar spine was performed without intravenous contrast administration. Multiplanar CT image reconstructions were also generated.  COMPARISON:  MRI lumbar spine 08/07/2019.  FINDINGS: Segmentation: Standard.  Alignment: Normal.  Vertebrae: No acute fracture or focal pathologic process.  Paraspinal and other soft tissues: Scattered atherosclerosis. No acute finding.  Disc levels: T12-L1:  Negative.  L1-2, L2-3, L3-4: Negative.  L4-5: Vacuum disc phenomenon and a shallow bulge. Mild loss of disc space height. Mild facet arthropathy. Disc and endplate spur cause unchanged mild-to-moderate right foraminal narrowing. The central canal and left foramen are open.  L5-S1: Loss of disc space height with a shallow bulge and endplate spur. Left worse than right facet degenerative change. Central canal and right foramen are widely patent. Moderate left foraminal stenosis. No change.  IMPRESSION: No new abnormality since the patient's lumbar spine MRI 08/07/2019.  Mild to moderate right foraminal narrowing L4-5.  Moderate left foraminal narrowing L5-S1.  Electronically Signed   By: Inge Rise M.D.   On: 08/17/2019 18:26 Note: Reviewed        Physical Exam  General appearance: Well nourished, well developed, and well hydrated. In no apparent acute distress Mental status: Alert, oriented x 3 (person, place, & time)       Respiratory: No evidence of acute respiratory distress Eyes: PERLA Vitals: BP 134/87   Pulse 84   Temp 98.1 F (36.7 C)   Resp 16   Ht 5' 4" (1.626 m)   Wt 147 lb (66.7 kg)   SpO2 100%   BMI 25.23 kg/m  BMI: Estimated body mass index is 25.23 kg/m as calculated from the following:   Height as of this encounter: 5' 4" (1.626 m).   Weight as of this encounter: 147 lb (66.7 kg). Ideal: Ideal body weight: 54.7 kg (120 lb 9.5 oz) Adjusted ideal body weight: 59.5 kg (131 lb 2.5 oz)  Diffuse arthralgias and myalgias of shoulders, low back, bilateral hips, knees. Limited lumbar flexion and extension secondary to pain  5 out of 5 strength bilateral lower extremity: Plantar flexion, dorsiflexion, knee flexion, knee extension.   Assessment   Diagnosis  1. Fibromyalgia   2. Chronic pain syndrome   3. Easy bruising      Updated Problems: Problem  Easy Bruising    Plan of Care   [name] has a current medication list which  includes the following long-term medication(s): cetirizine, linaclotide, pregabalin, rosuvastatin, and duloxetine.  Pharmacotherapy (Medications Ordered): Meds ordered this encounter  Medications  . pregabalin (LYRICA) 75 MG capsule    Sig: Take 1 capsule (75 mg total) by mouth 2 (two) times daily.    Dispense:  60 capsule    Refill:  3    Fill one day early if pharmacy is closed on scheduled refill date. May substitute for generic if available.  . DULoxetine (CYMBALTA) 60 MG capsule    Sig: Take 1 capsule (60 mg total) by mouth daily.    Dispense:  30 capsule    Refill:  3   Orders:  Orders Placed This Encounter  Procedures  . Protime-INR    Order Specific Question:   CC Results    Answer:   PCP-NURSE [[id number]]    Order Specific Question:   Release to patient    Answer:   Immediate   Follow-up plan:   Return in about 4 months (around 08/25/2021) for Medication Management, in person.   Recent Visits Date Type Provider Dept  02/07/21 Telemedicine Gillis Santa, MD [facility]  Showing recent visits within past 90 days and meeting all other requirements Today's Visits Date Type Provider Dept  04/24/21 Office Visit Gillis Santa, MD [facility]  Showing today's visits and meeting all other requirements Future Appointments No visits were found meeting these conditions. Showing future appointments within next 90 days and meeting all other requirements  I discussed the assessment and treatment plan with the patient. The patient was provided an opportunity to ask questions and all were answered. The patient agreed with the plan and demonstrated an understanding of the instructions.  Patient advised to call back or seek an in-person evaluation if the symptoms or condition worsens.  Duration of encounter: 30 minutes.  Note by: Gillis Santa, MD Date: 04/24/2021; Time: 2:48 PM

## 2021-04-24 NOTE — Progress Notes (Signed)
Safety precautions to be maintained throughout the outpatient stay will include: orient to surroundings, keep bed in low position, maintain call bell within reach at all times, provide assistance with transfer out of bed and ambulation.  

## 2021-04-25 ENCOUNTER — Telehealth: Payer: Self-pay | Admitting: *Deleted

## 2021-04-25 LAB — PROTIME-INR
INR: 1 (ref 0.9–1.2)
Prothrombin Time: 10.3 s (ref 9.1–12.0)

## 2021-04-25 NOTE — Telephone Encounter (Signed)
Called to give PT-INR results(normal) per Dr. Garnett Farm request.No answer. LVM for her to call us back.

## 2021-05-12 ENCOUNTER — Ambulatory Visit
Admission: EM | Admit: 2021-05-12 | Discharge: 2021-05-12 | Disposition: A | Discharge status: Home / Self Care | Payer: PRIVATE HEALTH INSURANCE | Attending: Physician Assistant | Admitting: Physician Assistant

## 2021-05-12 ENCOUNTER — Encounter: Payer: Self-pay | Admitting: Emergency Medicine

## 2021-05-12 ENCOUNTER — Ambulatory Visit (INDEPENDENT_AMBULATORY_CARE_PROVIDER_SITE_OTHER): Payer: PRIVATE HEALTH INSURANCE

## 2021-05-12 ENCOUNTER — Other Ambulatory Visit: Payer: Self-pay

## 2021-05-12 DIAGNOSIS — M79642 Pain in left hand: Secondary | ICD-10-CM | POA: Diagnosis not present

## 2021-05-12 DIAGNOSIS — S63602A Unspecified sprain of left thumb, initial encounter: Secondary | ICD-10-CM | POA: Diagnosis not present

## 2021-05-12 DIAGNOSIS — S63502A Unspecified sprain of left wrist, initial encounter: Secondary | ICD-10-CM

## 2021-05-12 DIAGNOSIS — M25532 Pain in left wrist: Secondary | ICD-10-CM | POA: Diagnosis not present

## 2021-05-12 IMAGING — CR DG HAND COMPLETE 3+V*L*
3 series · 3 of 3 positions shown · non-contrast
Comparison: None.

CLINICAL DATA: Pain at base of thumb

EXAM:
LEFT HAND - COMPLETE 3+ VIEW; LEFT WRIST - COMPLETE 3+ VIEW

[hand ap]
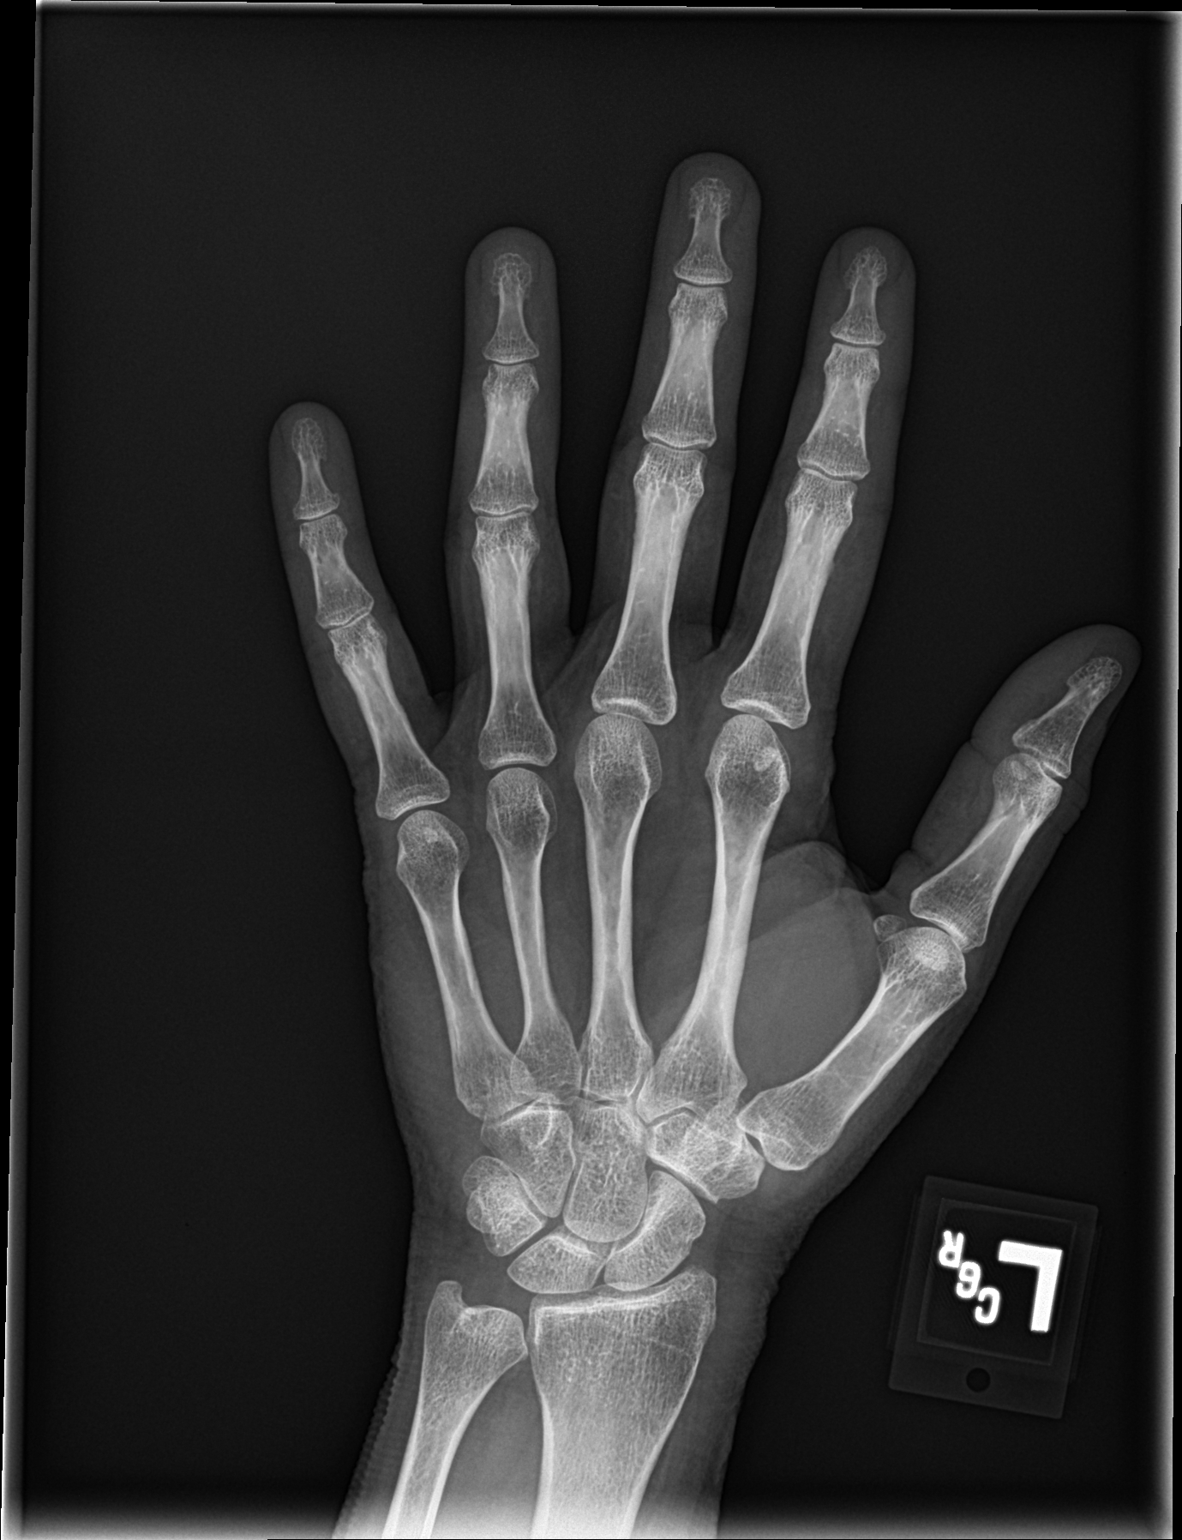

[hand obl]
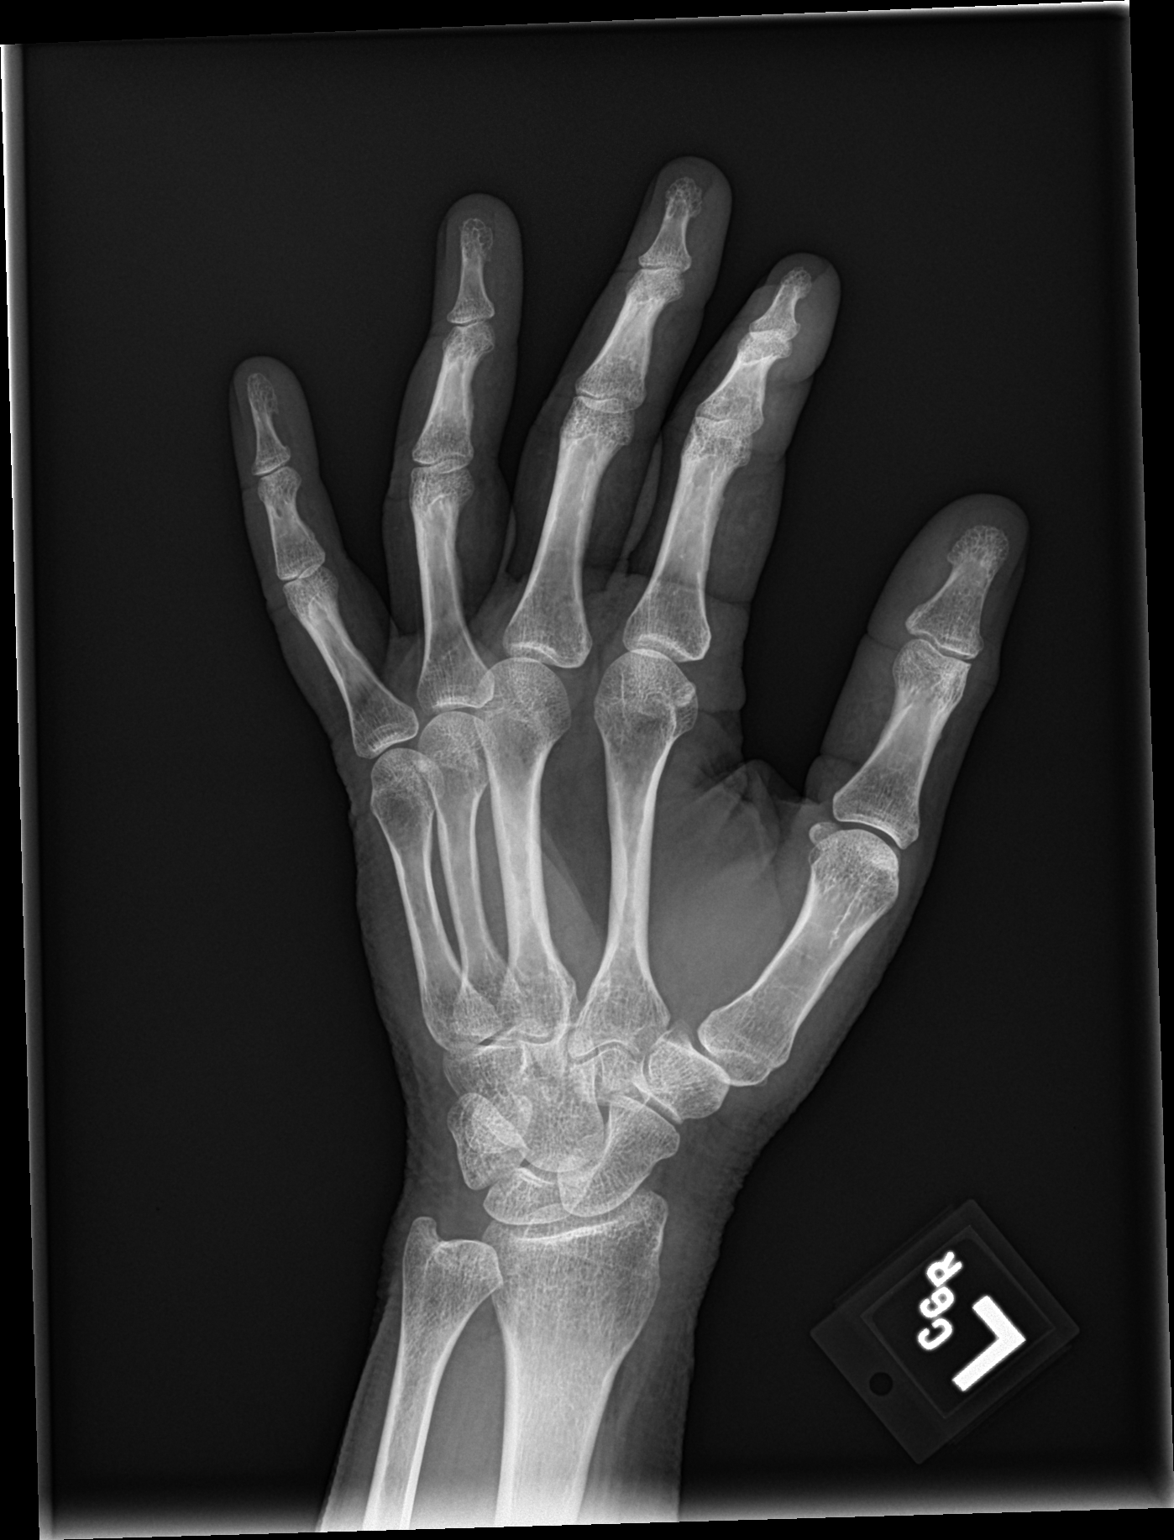

[hand lat]
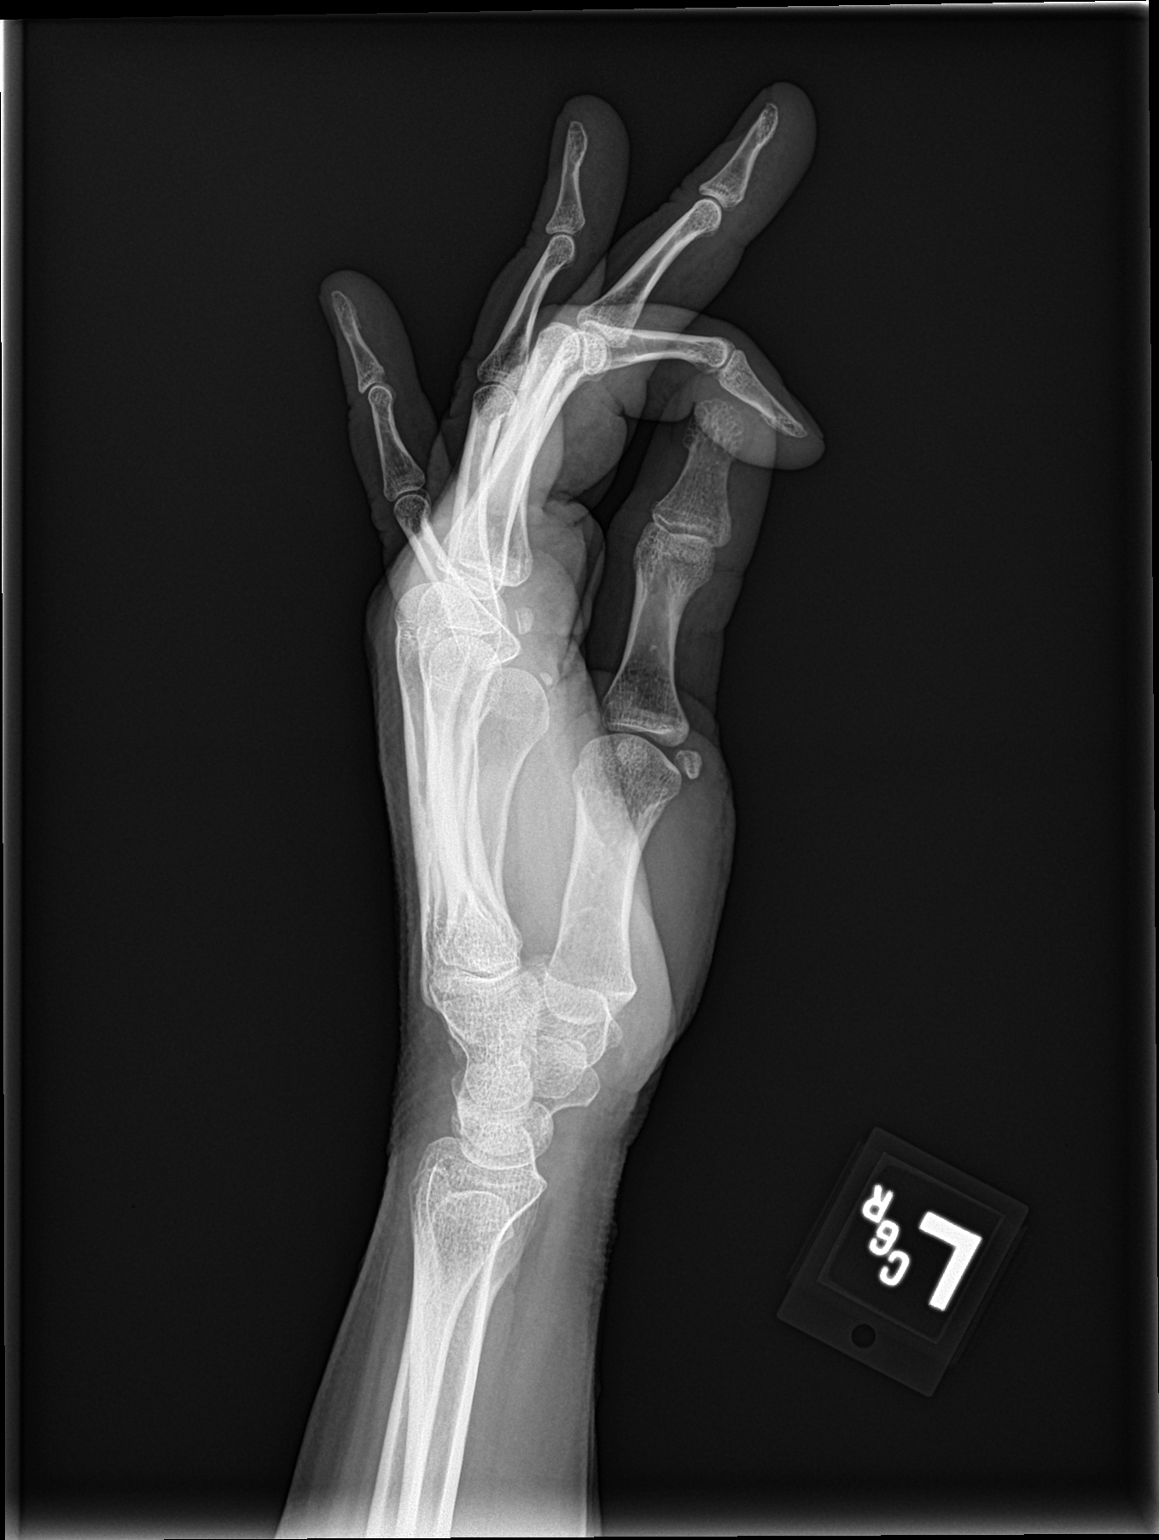

[3 of 3 positions shown; findings below may reference images not displayed]

FINDINGS: No acute fracture or dislocation. Joint spaces and alignment are
maintained. No area of erosion or osseous destruction. No unexpected
radiopaque foreign body. Soft tissues are unremarkable.
IMPRESSION: No acute fracture or dislocation. If persistent clinical concern for
scaphoid fracture, recommend immobilization and follow-up
radiographs in 2 weeks versus MRI.

## 2021-05-12 IMAGING — CR DG WRIST COMPLETE 3+V*L*
4 series · 4 of 4 positions shown · non-contrast
Comparison: None.

CLINICAL DATA: Pain at base of thumb

EXAM:
LEFT HAND - COMPLETE 3+ VIEW; LEFT WRIST - COMPLETE 3+ VIEW

[wrist pa]
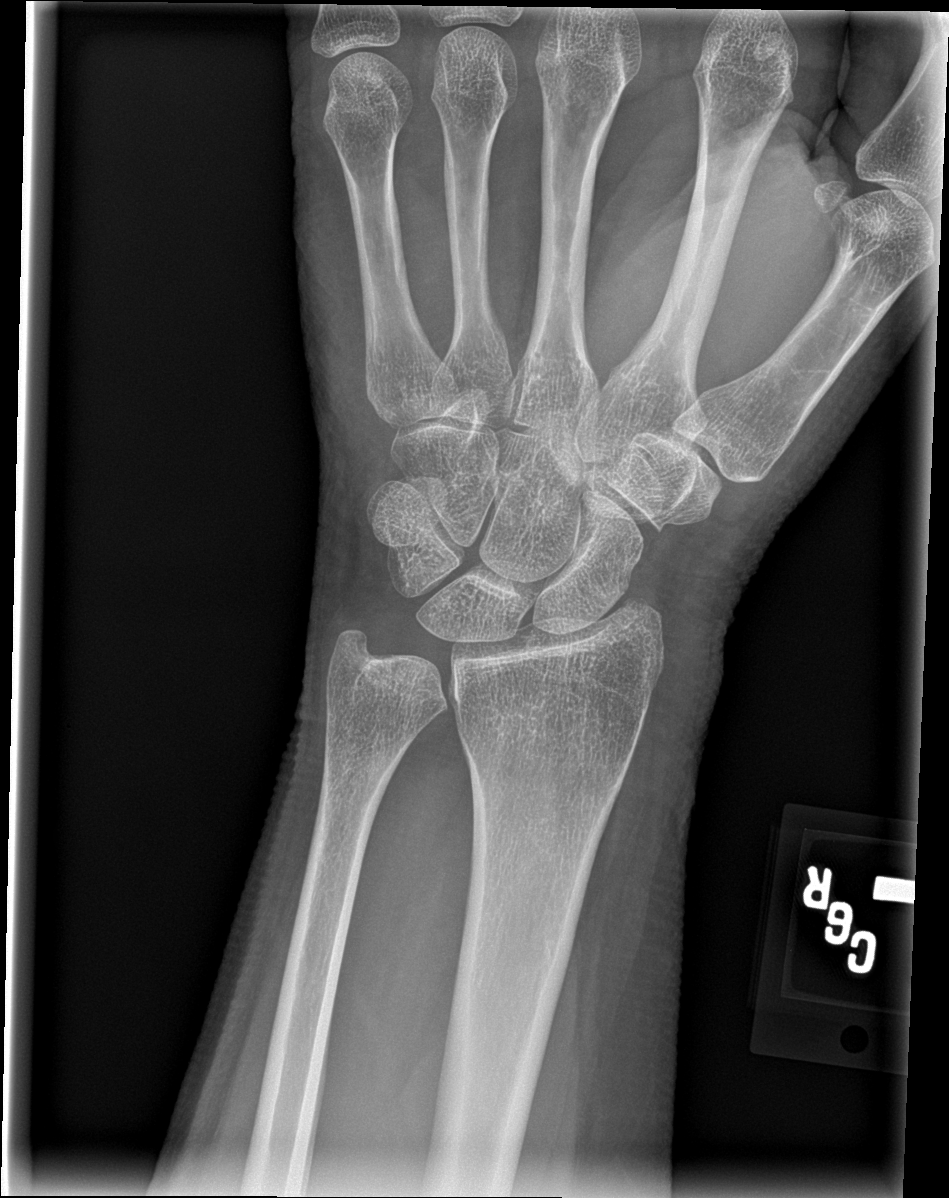

[wrist obl]
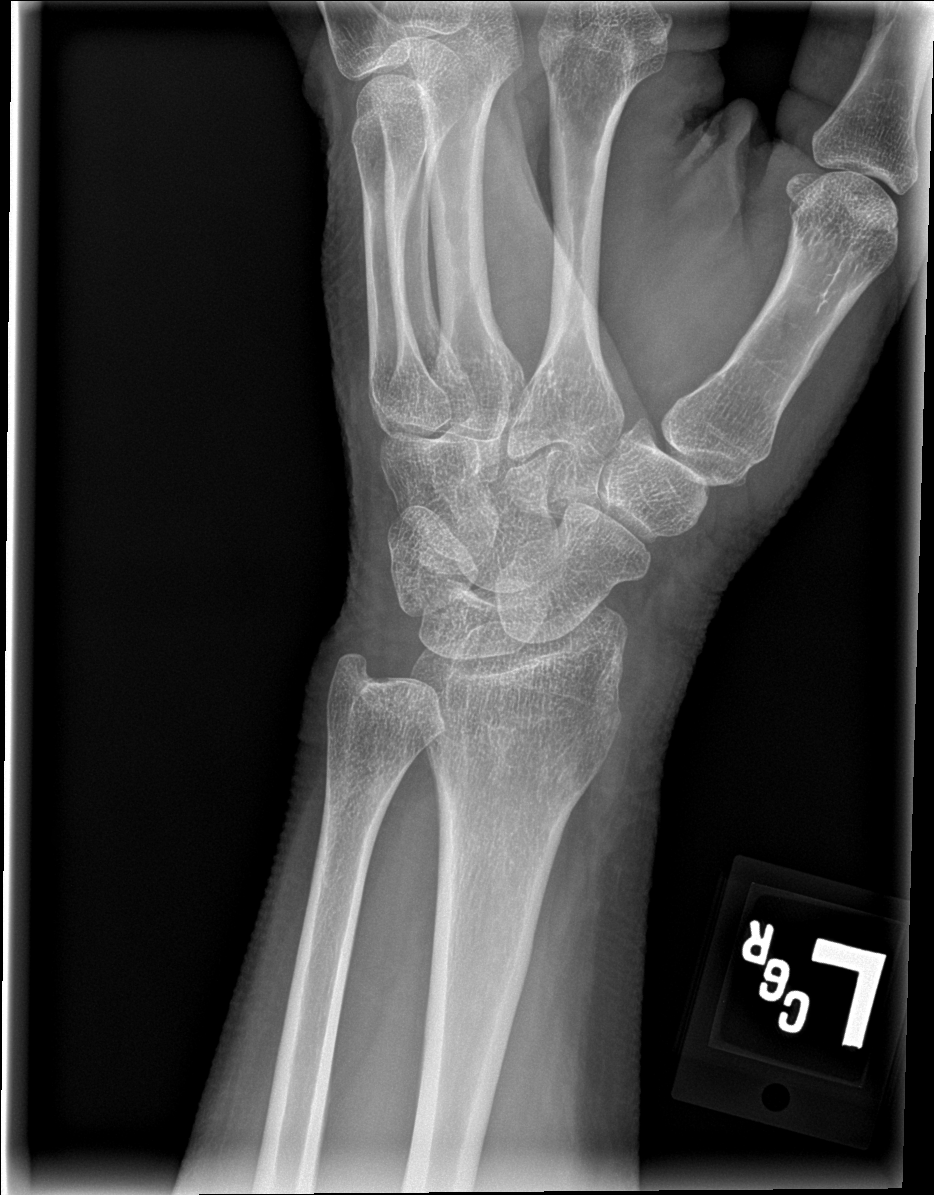

[wrist lat]
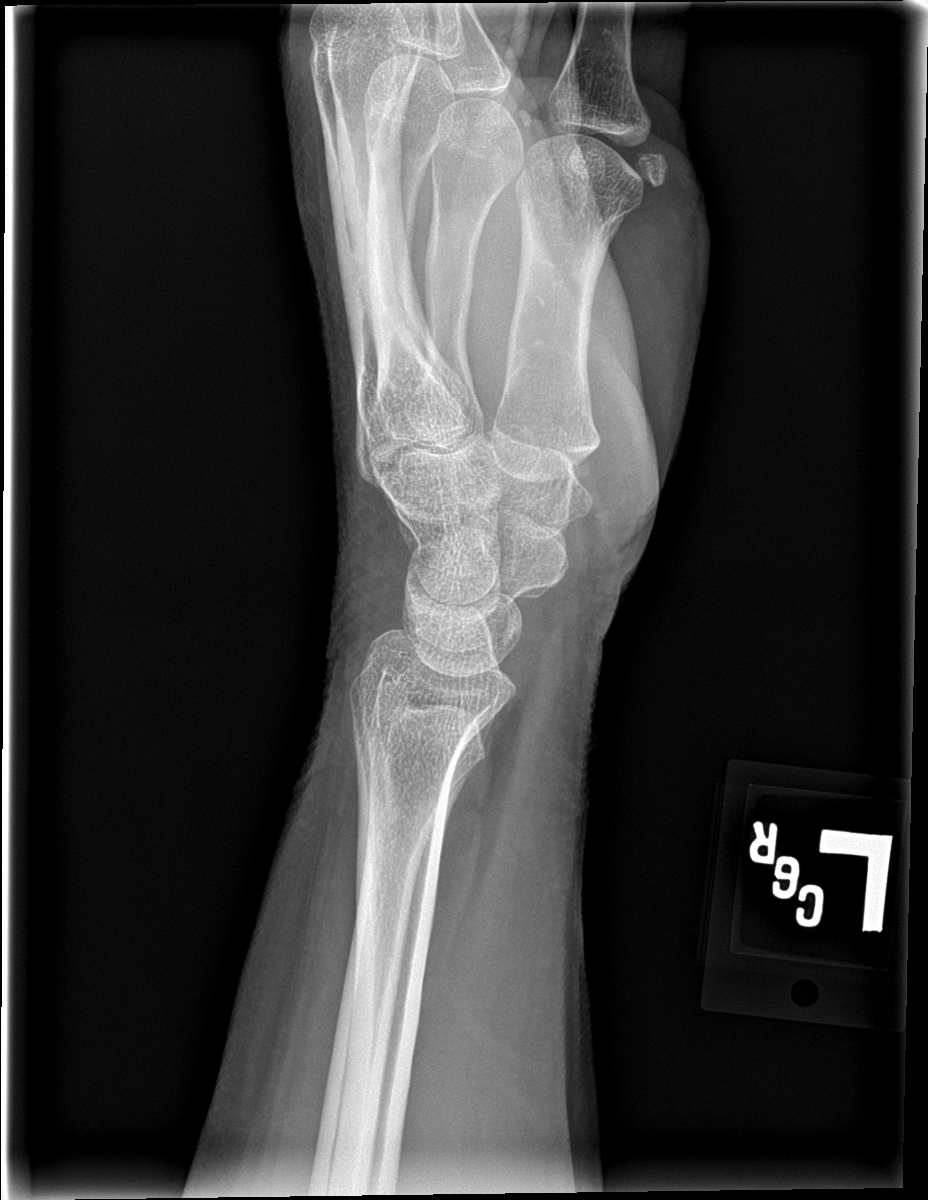

[wrist navicular]
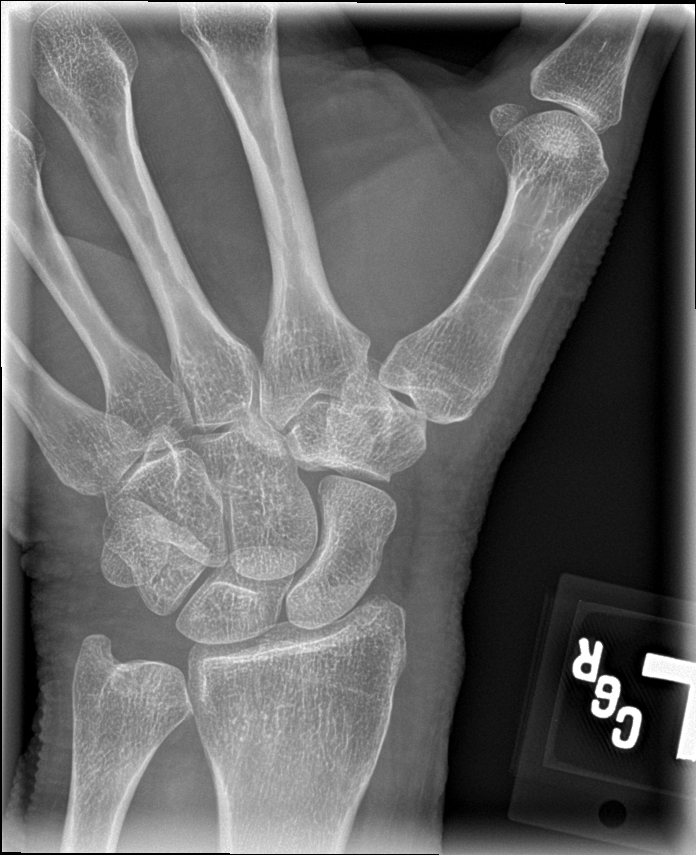

[4 of 4 positions shown; findings below may reference images not displayed]

FINDINGS: No acute fracture or dislocation. Joint spaces and alignment are
maintained. No area of erosion or osseous destruction. No unexpected
radiopaque foreign body. Soft tissues are unremarkable.
IMPRESSION: No acute fracture or dislocation. If persistent clinical concern for
scaphoid fracture, recommend immobilization and follow-up
radiographs in 2 weeks versus MRI.

## 2021-05-12 NOTE — ED Provider Notes (Signed)
[facility]    CSN: [id number] Arrival date & time: 05/12/21  1247      History   Chief Complaint Chief Complaint  Patient presents with  . Fall  . Hand Pain  . Wrist Pain    HPI Sandra Doyle is a 45 y.o. female presenting for pain of the left hand, thumb and radial wrist since yesterday.  Patient says that she tripped over her dog and threw her hands out to brace herself and she believes she jammed her hand on the table.  She says that it was swollen yesterday but the swelling has come down with icing it.  She is also taking Tylenol for pain.  Patient says the pain is significant.  She says she has a great deal of pain if she moves her thumb at all.  No numbness or tingling or weakness.  She is right-handed.  Patient denies any previous fractures or major injuries to this hand or wrist.  Patient has history of fibromyalgia and chronic back pain with lumbar radiculopathy.  She takes Lyrica daily.  No other concerns.  HPI  Past Medical History:  Diagnosis Date  . 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 Migraines   . SVT (supraventricular tachycardia) [facility])     Patient Active Problem List   Diagnosis Date Noted  . Easy bruising 04/24/2021  . Chronic migraine without aura without status migrainosus, not intractable 04/11/2020  . Fibromyalgia 02/16/2020  . Psoriatic arthritis ([facility]) 02/16/2020  . Chronic pain syndrome 02/16/2020  . Left lumbar radiculopathy 04/17/2016  . Sacral back pain 04/17/2016    Past Surgical History:  Procedure Laterality Date  . ABDOMINAL HYSTERECTOMY    . CARDIAC ELECTROPHYSIOLOGY MAPPING AND ABLATION    . CARDIAC SURGERY     ablasion  . WRIST SURGERY      OB History   No obstetric history on file.      Home Medications    Prior to Admission medications   Medication Sig Start Date End Date Taking? Authorizing Provider  cetirizine (ZYRTEC) 10 MG tablet Take 10 mg by mouth daily.   Yes [provider]   chlorhexidine (PERIDEX) 0.12 % solution 15 mLs 2 (two) times daily. 01/24/21  Yes [provider]  clobetasol ointment (TEMOVATE) 0.05 % Apply 1 application topically 2 (two) times daily. 04/17/21  Yes [provider]  fluticasone (FLONASE) 50 MCG/ACT nasal spray Place into the nose. 06/29/20 06/29/21 Yes [provider]  linaclotide (LINZESS) 145 MCG CAPS capsule Linzess 145 mcg capsule  TAKE 1 CAPSULE BY MOUTH ONCE DAILY 02/06/21  Yes [provider]  OPZELURA 1.5 % CREA Apply 1 application topically daily. 04/11/21  Yes [provider]  pregabalin (LYRICA) 75 MG capsule Take 1 capsule (75 mg total) by mouth 2 (two) times daily. 04/24/21 08/22/21 Yes Edward Jolly, MD  rosuvastatin (CRESTOR) 40 MG tablet Take 40 mg by mouth daily.   Yes [provider]  SUMAtriptan (IMITREX) 100 MG tablet sumatriptan 100 mg tablet 07/17/20  Yes [provider]  tiZANidine (ZANAFLEX) 2 MG tablet Take by mouth. 07/28/19  Yes [provider]  acetaminophen (TYLENOL) 650 MG CR tablet Take 650 mg by mouth every 8 (eight) hours as needed for pain.    [provider]  DULoxetine (CYMBALTA) 60 MG capsule Take 1 capsule (60 mg total) by mouth daily. 04/24/21 08/22/21  Edward Jolly, MD  EPINEPHrine 0.3 mg/0.3 mL IJ SOAJ injection epinephrine  0.3 mg/0.3 mL injection, auto-injector  INJECT INTO THIGH DURING ANAPHYLAXIS    [provider]  Sod Fluoride-Potassium Nitrate 1.1-5 % PSTE sodium fluoride 1.1 %-potassium nitrate 5 % dental paste  USE AS DIRECTED    [provider]  tacrolimus (PROTOPIC) 0.1 % ointment Apply 1 application topically 2 (two) times daily. 04/17/21   [provider]  triamcinolone cream (KENALOG) 0.5 % Apply topically 2 (two) times daily. 04/03/20   [provider]    Family History Family History  Problem Relation Age of Onset  . Hypertension Mother   . Stroke Mother   . Osteoarthritis Mother   .  Anuerysm Father     Social History Social History   Tobacco Use  . Smoking status: Former Smoker    Packs/day: 0.50    Types: Cigarettes  . Smokeless tobacco: Never Used  Vaping Use  . Vaping Use: Some days  Substance Use Topics  . Alcohol use: Yes    Comment: occasionally  . Drug use: No     Allergies   Patient has no known allergies.   Review of Systems Review of Systems  Musculoskeletal: Positive for arthralgias and joint swelling.  Skin: Negative for color change and wound.  Neurological: Negative for weakness and numbness.     Physical Exam Triage Vital Signs ED Triage Vitals  Enc Vitals Group     BP 05/12/21 1303 (!) 143/83     Pulse Rate 05/12/21 1303 76     Resp 05/12/21 1303 14     Temp 05/12/21 1303 98.2 F (36.8 C)     Temp Source 05/12/21 1303 Oral     SpO2 05/12/21 1303 99 %     Weight 05/12/21 1301 147 lb (66.7 kg)     Height 05/12/21 1301 5\' 4"  (1.626 m)     Head Circumference --      Peak Flow --      Pain Score 05/12/21 1301 4     Pain Loc --      Pain Edu? --      Excl. in GC? --    No data found.  Updated Vital Signs BP (!) 143/83 (BP Location: Right Arm)   Pulse 76   Temp 98.2 F (36.8 C) (Oral)   Resp 14   Ht 5\' 4"  (1.626 m)   Wt 147 lb (66.7 kg)   SpO2 99%   BMI 25.23 kg/m    Physical Exam Vitals and nursing note reviewed.  Constitutional:      General: She is not in acute distress.    Appearance: Normal appearance. She is not ill-appearing or toxic-appearing.  HENT:     Head: Normocephalic and atraumatic.  Eyes:     General: No scleral icterus.    Conjunctiva/sclera: Conjunctivae normal.  Cardiovascular:     Rate and Rhythm: Normal rate and regular rhythm.     Pulses: Normal pulses.  Pulmonary:     Effort: Pulmonary effort is normal. No respiratory distress.  Musculoskeletal:     Cervical back: Neck supple.     Comments: LEFT HAND/WRIST: No swelling appreciated.  No ecchymosis, abrasions or lacerations.  There  is tenderness to palpation of the radial aspect of the wrist and base of the right thumb.  Questionable snuffbox tenderness.  Increased pain with movement of thumb or flexion of wrist.  Pain with trying to make a fist.  Good sensation.  Skin:    General: Skin is dry.  Neurological:  General: No focal deficit present.     Mental Status: She is alert. Mental status is at baseline.     Motor: No weakness.     Gait: Gait normal.  Psychiatric:        Mood and Affect: Mood normal.        Behavior: Behavior normal.        Thought Content: Thought content normal.      UC Treatments / Results  Labs (all labs ordered are listed, but only abnormal results are displayed) Labs Reviewed - No data to display  EKG   Radiology DG Wrist Complete Left  Result Date: 05/12/2021 CLINICAL DATA:  Pain at base of thumb EXAM: LEFT HAND - COMPLETE 3+ VIEW; LEFT WRIST - COMPLETE 3+ VIEW COMPARISON:  None. FINDINGS: No acute fracture or dislocation. Joint spaces and alignment are maintained. No area of erosion or osseous destruction. No unexpected radiopaque foreign body. Soft tissues are unremarkable. IMPRESSION: No acute fracture or dislocation. If persistent clinical concern for scaphoid fracture, recommend immobilization and follow-up radiographs in 2 weeks versus MRI. Electronically Signed   By: Meda KlinefelterStephanie  Peacock MD   On: 05/12/2021 13:30   DG Hand Complete Left  Result Date: 05/12/2021 CLINICAL DATA:  Pain at base of thumb EXAM: LEFT HAND - COMPLETE 3+ VIEW; LEFT WRIST - COMPLETE 3+ VIEW COMPARISON:  None. FINDINGS: No acute fracture or dislocation. Joint spaces and alignment are maintained. No area of erosion or osseous destruction. No unexpected radiopaque foreign body. Soft tissues are unremarkable. IMPRESSION: No acute fracture or dislocation. If persistent clinical concern for scaphoid fracture, recommend immobilization and follow-up radiographs in 2 weeks versus MRI. Electronically Signed   By:  Meda KlinefelterStephanie  Peacock MD   On: 05/12/2021 13:30    Procedures Procedures (including critical care time)  Medications Ordered in UC Medications - No data to display  Initial Impression / Assessment and Plan / UC Course  I have reviewed the triage vital signs and the nursing notes.  Pertinent labs & imaging results that were available during my care of the patient were reviewed by me and considered in my medical decision making (see chart for details).   X-ray of the left hand and wrist obtained today.  No definite fractures.  Since patient does have some questionable snuffbox tenderness, placing her in a thumb spica splint and advising her not to use the affected extremity and to follow-up with Ortho in 1 to 2 weeks if still having pain or sooner for any worsening of pain.  Supportive care at this time with RICE and Tylenol and very low-dose NSAID if needed and to continue with her home Lyrica.   Final Clinical Impressions(s) / UC Diagnoses   Final diagnoses:  Sprain of left thumb, unspecified site of digit, initial encounter  Sprain of left wrist, initial encounter     Discharge Instructions     SPRAIN: Stressed avoiding painful activities . Reviewed RICE guidelines. Use medications as directed, including NSAIDs. If no NSAIDs have been prescribed for you today, you may take Aleve or Motrin over the counter. May use Tylenol in between doses of NSAIDs.  If no improvement in the next 1-2 weeks, f/u with PCP or return to our office for reexamination, and please feel free to call or return at any time for any questions or concerns you may have and we will be happy to help you!      Is still having pain in 2 weeks need to be seen again.  I would advise going to [facility] walk-in urgent care in [place].  Goal or sooner for any worsening symptoms.  You may need repeat imaging.    ED Prescriptions    None     I have reviewed the PDMP during this encounter.   Shirlee Latch,  PA-C 05/12/21 [phone]

## 2021-05-12 NOTE — ED Triage Notes (Signed)
Patient c/o left hand and wrist pain due to falling last night at home.

## 2021-05-12 NOTE — Discharge Instructions (Addendum)
SPRAIN: Stressed avoiding painful activities . Reviewed RICE guidelines. Use medications as directed, including NSAIDs. If no NSAIDs have been prescribed for you today, you may take Aleve or Motrin over the counter. May use Tylenol in between doses of NSAIDs.  If no improvement in the next 1-2 weeks, f/u with PCP or return to our office for reexamination, and please feel free to call or return at any time for any questions or concerns you may have and we will be happy to help you!      Is still having pain in 2 weeks need to be seen again.  I would advise going to [facility] walk-in urgent care in [place].  Goal or sooner for any worsening symptoms.  You may need repeat imaging.

## 2021-05-17 ENCOUNTER — Other Ambulatory Visit: Payer: Self-pay | Admitting: Student in an Organized Health Care Education/Training Program

## 2021-05-17 DIAGNOSIS — G894 Chronic pain syndrome: Secondary | ICD-10-CM

## 2021-05-17 DIAGNOSIS — M797 Fibromyalgia: Secondary | ICD-10-CM

## 2021-06-21 ENCOUNTER — Other Ambulatory Visit: Payer: Self-pay | Admitting: Family Medicine

## 2021-06-21 DIAGNOSIS — Z1231 Encounter for screening mammogram for malignant neoplasm of breast: Secondary | ICD-10-CM

## 2021-07-05 ENCOUNTER — Other Ambulatory Visit: Payer: Self-pay

## 2021-07-05 ENCOUNTER — Ambulatory Visit
Admission: RE | Admit: 2021-07-05 | Discharge: 2021-07-05 | Disposition: A | Discharge status: Home / Self Care | Payer: PRIVATE HEALTH INSURANCE | Source: Ambulatory Visit | Attending: Family Medicine | Admitting: Family Medicine

## 2021-07-05 DIAGNOSIS — Z1231 Encounter for screening mammogram for malignant neoplasm of breast: Secondary | ICD-10-CM | POA: Diagnosis present

## 2021-07-05 IMAGING — MG MM DIGITAL SCREENING BILAT W/ TOMO AND CAD
8 series · 8 of 24 positions shown · non-contrast
Comparison: Previous exam(s).

CLINICAL DATA: Screening. History of benign cysts within each
breast.

EXAM:
DIGITAL SCREENING BILATERAL MAMMOGRAM WITH TOMOSYNTHESIS AND CAD
TECHNIQUE: Bilateral screening digital craniocaudal and mediolateral oblique
mammograms were obtained. Bilateral screening digital breast
tomosynthesis was performed. The images were evaluated with
computer-aided detection.

[R MLO synth-2D]
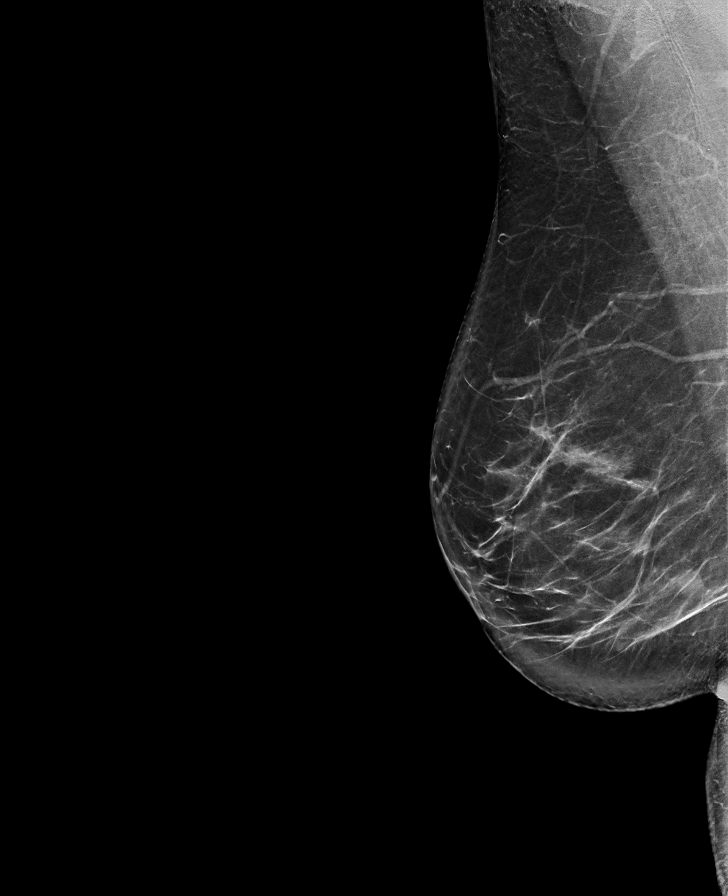

[L CC synth-2D]
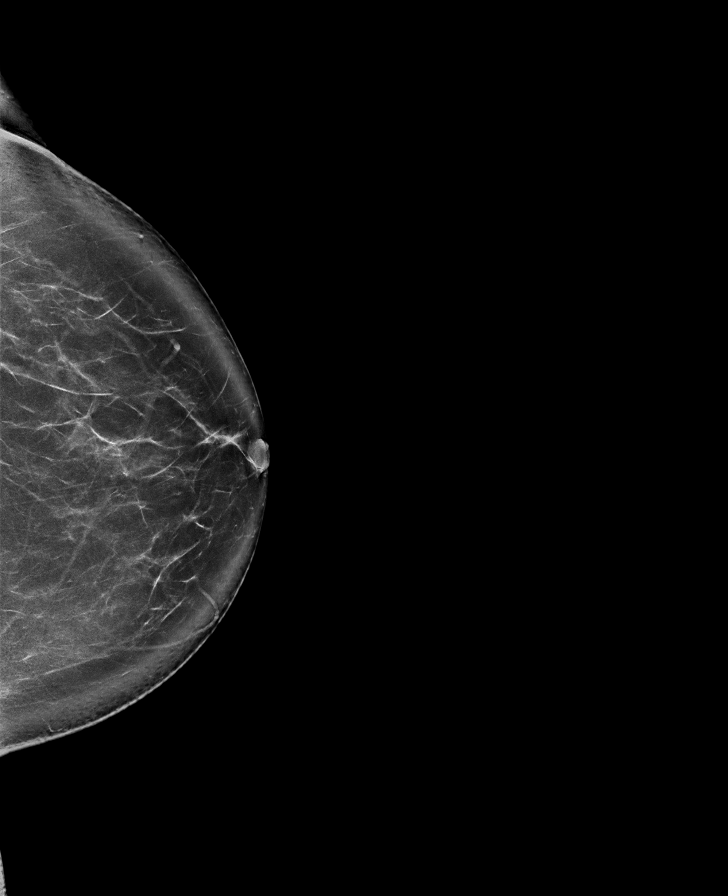

[L MLO synth-2D]
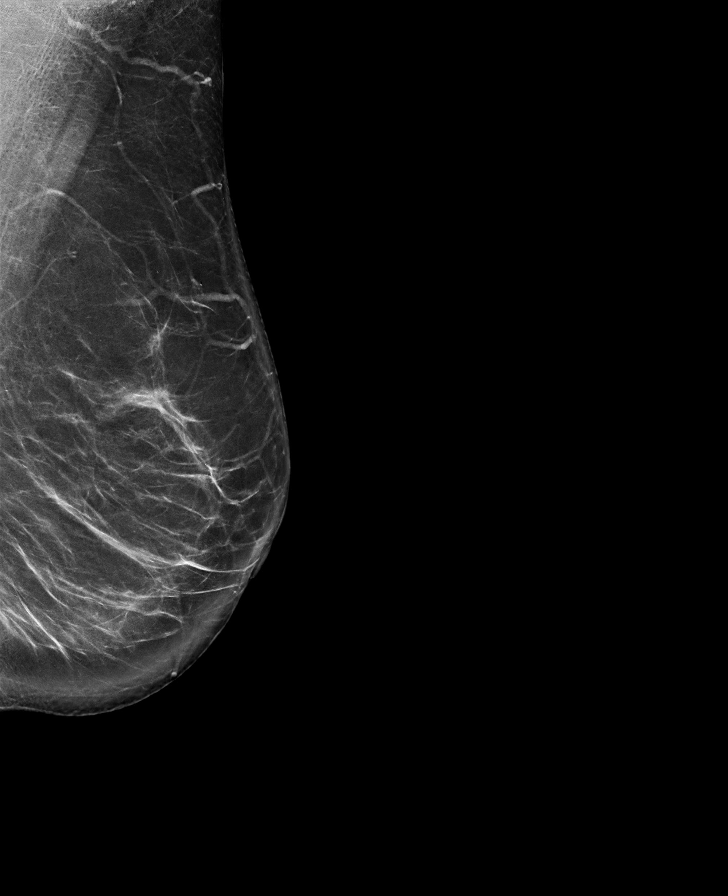

[R CC synth-2D]
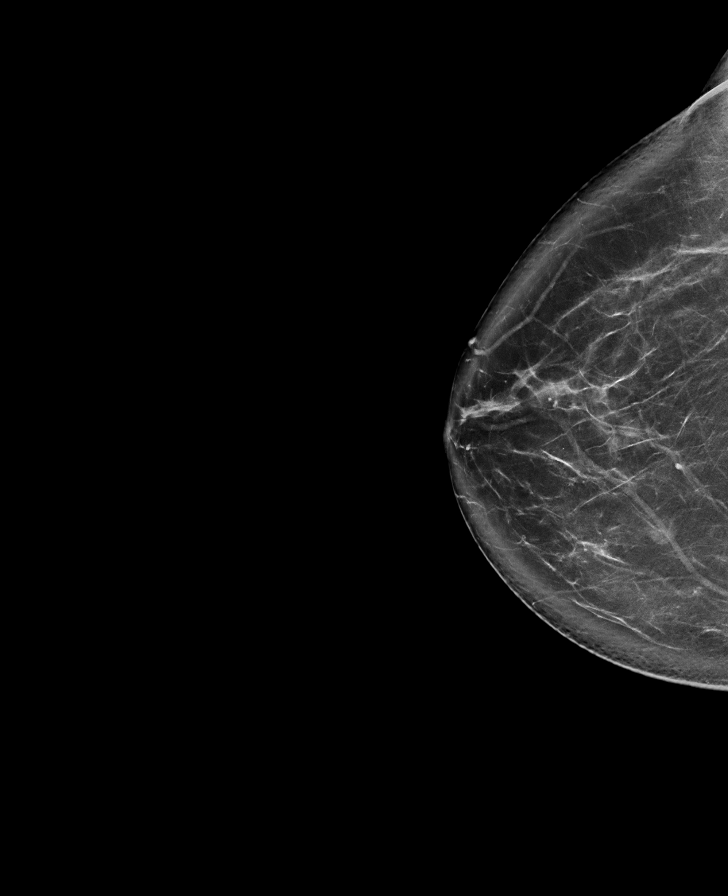

[R MLO tomo · tomo slice 45/90.0]
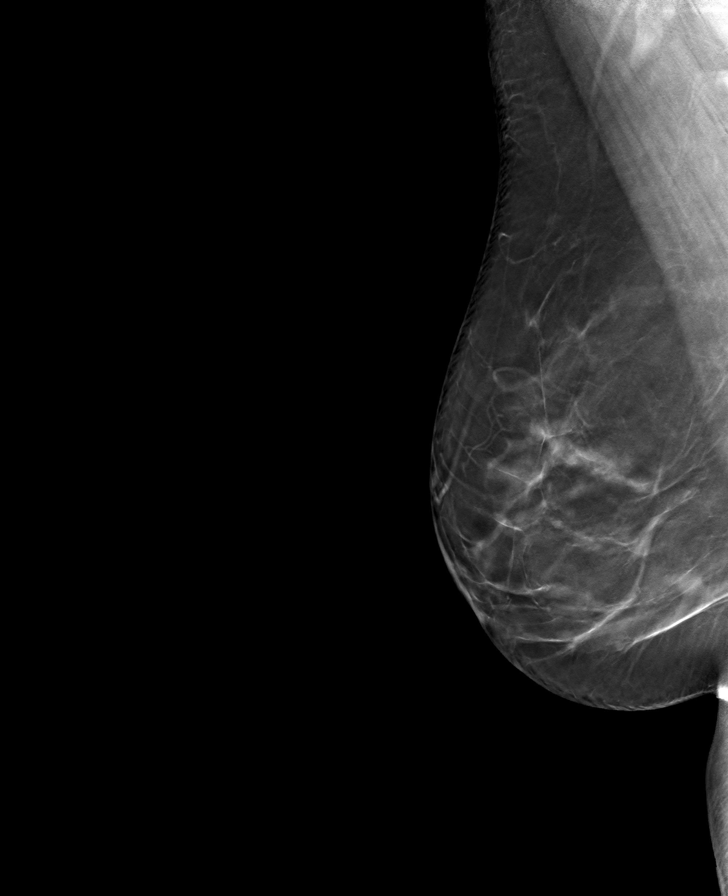

[L MLO tomo · tomo slice 47/92.0]
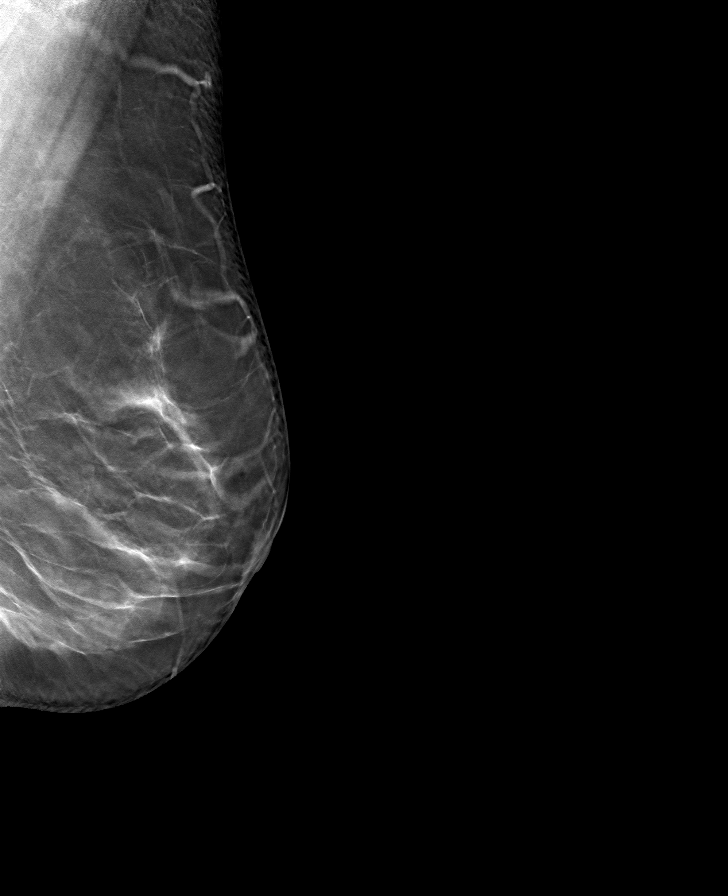

[R CC tomo · tomo slice 48/95.0]
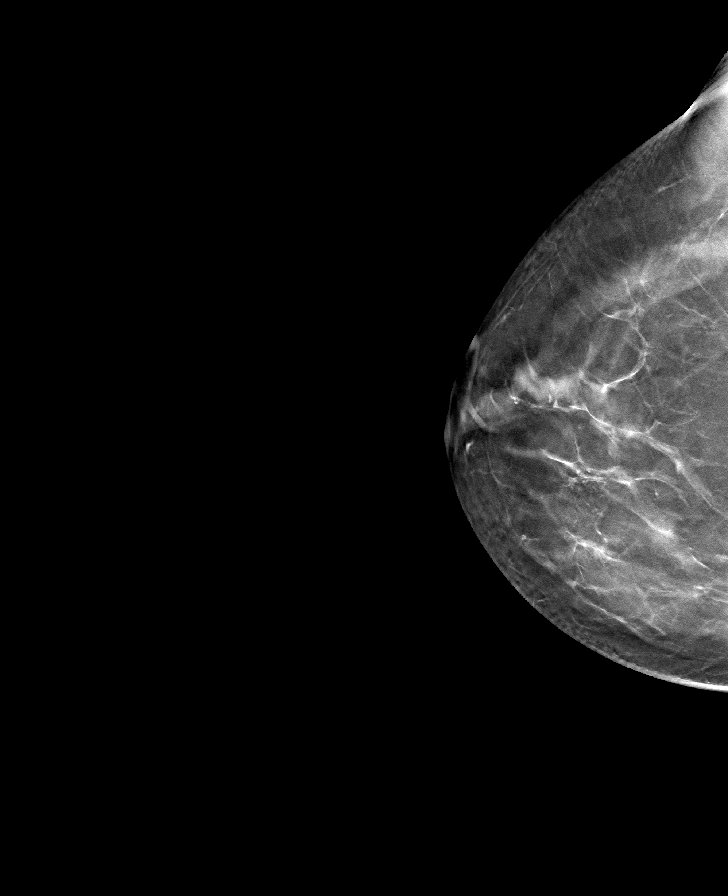

[L CC tomo · tomo slice 51/100.0]
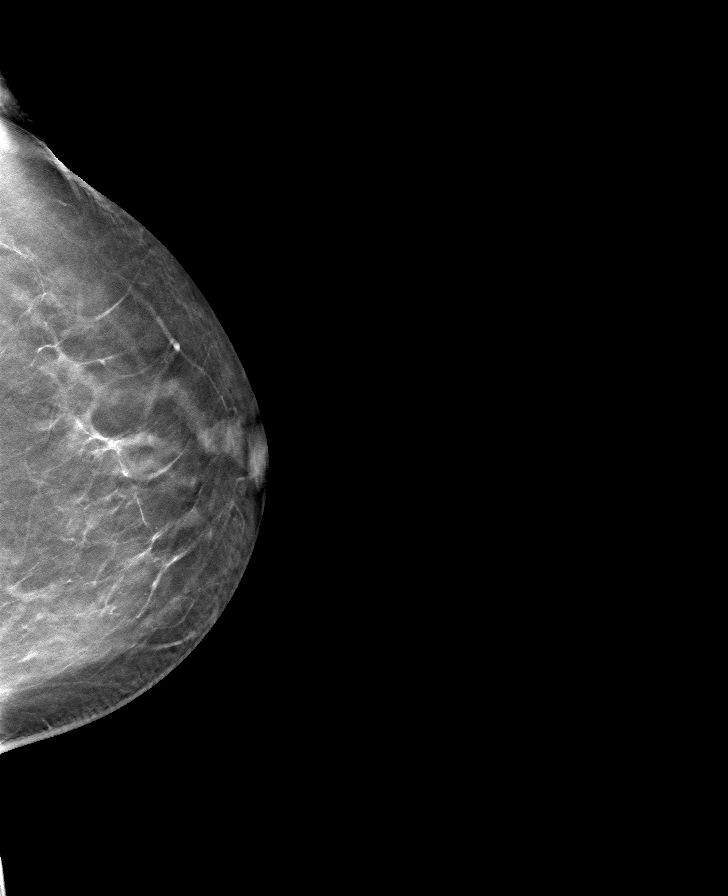

[8 of 24 positions shown; findings below may reference images not displayed]

ACR Breast Density Category b: There are scattered areas of
fibroglandular density.
FINDINGS: There are no findings suspicious for malignancy.
IMPRESSION: No mammographic evidence of malignancy. A result letter of this
screening mammogram will be mailed directly to the patient.

RECOMMENDATION:
Screening mammogram in one year. (Code:[ST])

BI-RADS CATEGORY  1: Negative.

## 2021-07-09 ENCOUNTER — Telehealth: Payer: Self-pay | Admitting: Student in an Organized Health Care Education/Training Program

## 2021-07-09 NOTE — Telephone Encounter (Signed)
Please schedule her an appointment to discuss medications.

## 2021-07-11 ENCOUNTER — Ambulatory Visit
Payer: PRIVATE HEALTH INSURANCE | Attending: Student in an Organized Health Care Education/Training Program | Admitting: Student in an Organized Health Care Education/Training Program

## 2021-07-11 ENCOUNTER — Other Ambulatory Visit: Payer: Self-pay

## 2021-07-11 ENCOUNTER — Encounter: Payer: Self-pay | Admitting: Student in an Organized Health Care Education/Training Program

## 2021-07-11 VITALS — BP 132/90 | HR 105 | Temp 96.7°F | Resp 16 | Ht 64.0 in | Wt 160.0 lb

## 2021-07-11 DIAGNOSIS — M542 Cervicalgia: Secondary | ICD-10-CM | POA: Insufficient documentation

## 2021-07-11 DIAGNOSIS — G894 Chronic pain syndrome: Secondary | ICD-10-CM | POA: Insufficient documentation

## 2021-07-11 DIAGNOSIS — M5412 Radiculopathy, cervical region: Secondary | ICD-10-CM | POA: Diagnosis present

## 2021-07-11 MED ORDER — HYDROCODONE-ACETAMINOPHEN 7.5-325 MG PO TABS: 1.0000 | ORAL_TABLET | Freq: Three times a day (TID) | ORAL | 0 refills | Status: AC | PRN

## 2021-07-11 MED ORDER — PREGABALIN 75 MG PO CAPS: 75.0000 mg | ORAL_CAPSULE | Freq: Three times a day (TID) | ORAL | 3 refills | Status: DC

## 2021-07-11 NOTE — Progress Notes (Signed)
PROVIDER NOTE: Information contained herein reflects review and annotations entered in association with encounter. Interpretation of such information and data should be left to medically-trained personnel. Information provided to patient can be located elsewhere in the medical record under "Patient Instructions". Document created using STT-dictation technology, any transcriptional errors that may result from process are unintentional.    Patient: Sandra Doyle  Service Category: E/M  Provider: Gillis Santa, MD  DOB: Apr 05, 1976  DOS: 07/11/2021  Specialty: Interventional Pain Management  MRN: [id number]  Setting: Ambulatory outpatient  PCP: Valera Castle, MD  Type: Established Patient    Referring Provider: Valera Castle, *  Location: Office  Delivery: Face-to-face     HPI  [name], a 45 y.o. year old female, is here today because of her Cervical radicular pain [M54.12]. [name] primary complain today is Neck Pain (Midline. ), Arm Pain (left), and Shoulder Pain (Left ) Last encounter: My last encounter with her was on 07/09/2021. Pertinent problems: [name] has Left lumbar radiculopathy; Sacral back pain; Fibromyalgia; Psoriatic arthritis ([place]); Chronic pain syndrome; and Chronic migraine without aura without status migrainosus, not intractable on their pertinent problem list. Pain Assessment: Severity of Chronic pain is reported as a 9 /10. Location: Neck Mid/into left shoulder and down arm into the hand.. Onset: More than a month ago. Quality: Discomfort, Constant, Stabbing. Timing: Constant. Modifying factor(s): nothing currently. Vitals:  height is 5' 4" (1.626 m) and weight is 160 lb (72.6 kg). Her temporal temperature is 96.7 F (35.9 C) (abnormal). Her blood pressure is 132/90 and her pulse is 105 (abnormal). Her respiration is 16 and oxygen saturation is 97%.   Reason for encounter:   Patient endorses severe pain lumbar cervical region that radiates  along her posterior shoulder down her medial bicep into her forearm and left hand in a dermatomal fashion.  Patient went to her primary care provider after going to urgent care for this pain flare.  She was prescribed an oral steroid but this was not helpful.  She presents today with her left arm AB ducted and internally rotated.  She is very uncomfortable.  Today she states that her pain is a 10 out of 10.  She is having difficulty with fine motor control of her left forearm and hand.   ROS  Constitutional: Denies any fever or chills Gastrointestinal: No reported hemesis, hematochezia, vomiting, or acute GI distress Musculoskeletal: Denies any acute onset joint swelling, redness, loss of ROM, or weakness Neurological:  Left arm numbness and tingling  Medication Review  DULoxetine, EPINEPHrine, HYDROcodone-acetaminophen, Ruxolitinib Phosphate, SUMAtriptan, Sod Fluoride-Potassium Nitrate, acetaminophen, cetirizine, chlorhexidine, clobetasol ointment, cyclobenzaprine, folic acid, linaclotide, predniSONE, pregabalin, rosuvastatin, sulfaSALAzine, tacrolimus, tiZANidine, and triamcinolone cream  History Review  Allergy: [name] has No Known Allergies. Drug: [name]  reports no history of drug use. Alcohol:  reports current alcohol use. Tobacco:  reports that she has quit smoking. Her smoking use included cigarettes. She smoked an average of .5 packs per day. She has never used smokeless tobacco. Social: [name]  reports that she has quit smoking. Her smoking use included cigarettes. She smoked an average of .5 packs per day. She has never used smokeless tobacco. She reports current alcohol use. She reports that she does not use drugs. Medical:  has a past medical history of Chronic kidney disease, Migraines, and SVT (supraventricular tachycardia) ([place]). Surgical: Ms. Vita  has a past surgical history that includes Abdominal hysterectomy; Cardiac surgery; Wrist surgery; and Cardiac  electrophysiology mapping and ablation. Family: family history includes Anuerysm in her father; Hypertension in her mother; Osteoarthritis in her mother; Stroke in her mother.  Laboratory Chemistry Profile   Renal Lab Results  Component Value Date   BUN 16 08/17/2019   CREATININE 0.98 08/17/2019   GFRAA >60 08/17/2019   GFRNONAA >60 08/17/2019    Hepatic Lab Results  Component Value Date   AST 22 08/17/2019   ALT 21 08/17/2019   ALBUMIN 4.2 08/17/2019   ALKPHOS 88 08/17/2019    Electrolytes Lab Results  Component Value Date   NA 143 08/17/2019   K 3.2 (L) 08/17/2019   CL 107 08/17/2019   CALCIUM 10.2 08/17/2019    Bone No results found for: VD25OH, VD125OH2TOT, NL9767HA1, PF7902IO9, 25OHVITD1, 25OHVITD2, 25OHVITD3, TESTOFREE, TESTOSTERONE  Inflammation (CRP: Acute Phase) (ESR: Chronic Phase) No results found for: CRP, ESRSEDRATE, LATICACIDVEN       Note: Above Lab results reviewed.  Recent Imaging Review  MM 3D SCREEN BREAST BILATERAL CLINICAL DATA:  Screening. History of benign cysts within each breast.  EXAM: DIGITAL SCREENING BILATERAL MAMMOGRAM WITH TOMOSYNTHESIS AND CAD  TECHNIQUE: Bilateral screening digital craniocaudal and mediolateral oblique mammograms were obtained. Bilateral screening digital breast tomosynthesis was performed. The images were evaluated with computer-aided detection.  COMPARISON:  Previous exam(s).  ACR Breast Density Category b: There are scattered areas of fibroglandular density.  FINDINGS: There are no findings suspicious for malignancy.  IMPRESSION: No mammographic evidence of malignancy. A result letter of this screening mammogram will be mailed directly to the patient.  RECOMMENDATION: Screening mammogram in one year. (Code:[id number]-B-01Y)  BI-RADS CATEGORY  1: Negative.  Electronically Signed   By: Franki Cabot M.D.   On: 07/08/2021 09:28 Note: Reviewed        Physical Exam  General appearance: alert, cooperative,  and in moderate distress Mental status: Alert, oriented x 3 (person, place, & time)       Respiratory: No evidence of acute respiratory distress Eyes: PERLA Vitals: BP 132/90 (BP Location: Right Arm, Patient Position: Sitting, Cuff Size: Normal)   Pulse (!) 105   Temp (!) 96.7 F (35.9 C) (Temporal)   Resp 16   Ht 5' 4" (1.626 m)   Wt 160 lb (72.6 kg)   SpO2 97%   BMI 27.46 kg/m  BMI: Estimated body mass index is 27.46 kg/m as calculated from the following:   Height as of this encounter: 5' 4" (1.626 m).   Weight as of this encounter: 160 lb (72.6 kg). Ideal: Ideal body weight: 54.7 kg (120 lb 9.5 oz) Adjusted ideal body weight: 61.9 kg (136 lb 5.7 oz)  Cervical Spine Area Exam  Skin & Axial Inspection: No masses, redness, edema, swelling, or associated skin lesions Alignment: Symmetrical Functional ROM: Pain restricted ROM      Stability: No instability detected Muscle Tone/Strength: Functionally intact. No obvious neuro-muscular anomalies detected. Sensory (Neurological): Dermatomal pain pattern Palpation: No palpable anomalies             Upper Extremity (UE) Exam    Side: Right upper extremity  Side: Left upper extremity  Skin & Extremity Inspection: Skin color, temperature, and hair growth are WNL. No peripheral edema or cyanosis. No masses, redness, swelling, asymmetry, or associated skin lesions. No contractures.  Skin & Extremity Inspection: Skin color, temperature, and hair growth are WNL. No peripheral edema or cyanosis. No masses, redness, swelling, asymmetry, or associated skin lesions. No contractures.  Functional ROM: Unrestricted ROM  Functional ROM: Pain restricted ROM          Muscle Tone/Strength: Functionally intact. No obvious neuro-muscular anomalies detected.  Muscle Tone/Strength: Functionally intact. No obvious neuro-muscular anomalies detected.  Sensory (Neurological): Unimpaired          Sensory (Neurological): Dermatomal pain pattern left C6, left  C7          Palpation: No palpable anomalies              Palpation: No palpable anomalies              Provocative Test(s):  Phalen's test: deferred Tinel's test: deferred Apley's scratch test (touch opposite shoulder):  Action 1 (Across chest): deferred Action 2 (Overhead): deferred Action 3 (LB reach): deferred   Provocative Test(s):  Phalen's test: deferred Tinel's test: deferred Apley's scratch test (touch opposite shoulder):  Action 1 (Across chest): Decreased ROM Action 2 (Overhead): Decreased ROM Action 3 (LB reach): Decreased ROM   Positive Spurling's on the right  4 out of 5 strength LEFT upper extremity: Shoulder abduction, elbow flexion, elbow extension, thumb extension. 5 out of 5 strength RIGHT upper extremity: Shoulder abduction, elbow flexion, elbow extension, thumb extension.    Assessment   Status Diagnosis  Having a Flare-up Having a Flare-up Having a Flare-up 1. Cervical radicular pain (left)   2. Cervical radiculitis   3. Cervicalgia   4. Chronic pain syndrome      Updated Problems: Problem  Cervical Radiculitis  Cervicalgia    Plan of Care    [name] has a current medication list which includes the following long-term medication(s): cetirizine, duloxetine, linaclotide, rosuvastatin, pregabalin, and sulfasalazine.  Pharmacotherapy (Medications Ordered): Meds ordered this encounter  Medications   pregabalin (LYRICA) 75 MG capsule    Sig: Take 1 capsule (75 mg total) by mouth 3 (three) times daily.    Dispense:  90 capsule    Refill:  3    Fill one day early if pharmacy is closed on scheduled refill date. May substitute for generic if available.   HYDROcodone-acetaminophen (NORCO) 7.5-325 MG tablet    Sig: Take 1 tablet by mouth every 8 (eight) hours as needed for up to 15 days for severe pain.    Dispense:  30 tablet    Refill:  0    Chronic Pain: STOP Act (Not applicable) Fill 1 day early if closed on refill date. Avoid  benzodiazepines within 8 hours of opioids    Orders:  Orders Placed This Encounter  Procedures   MR CERVICAL SPINE [place]    Patient presents with axial pain with possible radicular component.  In addition to any acute findings, please report on:  1. Facet (Zygapophyseal) joint DJD (Hypertrophy, space narrowing, subchondral sclerosis, and/or osteophyte formation) 2. DDD and/or IVDD (Loss of disc height, desiccation or "Black disc disease") 3. Pars defects 4. Spondylolisthesis, spondylosis, and/or spondyloarthropathies (include Degree/Grade of displacement in mm) 5. Vertebral body Fractures, including age (old, new/acute) 59. Modic Type Changes 7. Demineralization 8. Bone pathology 9. Central, Lateral Recess, and/or Foraminal Stenosis (include AP diameter of stenosis in mm) 10. Surgical changes (hardware type, status, and presence of fibrosis) NOTE: Please specify level(s) and laterality.    Standing Status:   Future    Standing Expiration Date:   08/11/2021    Scheduling Instructions:     Imaging must be done as soon as possible. Inform patient that order will expire within 30 days and I will not renew it.  Order Specific Question:   What is the patient's sedation requirement?    Answer:   No Sedation    Order Specific Question:   Does the patient have a pacemaker or implanted devices?    Answer:   No    Order Specific Question:   Preferred imaging location?    Answer:   [facility] (table limit-350lbs)    Order Specific Question:   Call Results- Best Contact Number?    Answer:   [phone] ([facility])    Order Specific Question:   Radiology Contrast Protocol - do NOT remove file path    Answer:   [email or web address]   Compliance Drug Analysis, Ur    Volume: 30 ml(s). Minimum 3 ml of urine is needed. Document temperature of fresh sample. Indications: Long term (current) use of opiate analgesic (Y69.485) Test#: [phone]  (Comprehensive Profile)    Order Specific Question:   Release to patient    Answer:   Immediate    Once patient has completed her cervical MRI, I will call her with results and to discuss treatment plan which will likely include cervical epidural steroid injection.  Follow-up plan:   Return for will call pt with results.    Recent Visits Date Type Provider Dept  04/24/21 Office Visit Gillis Santa, MD [facility]  Showing recent visits within past 90 days and meeting all other requirements Today's Visits Date Type Provider Dept  07/11/21 Office Visit Gillis Santa, MD [facility]  Showing today's visits and meeting all other requirements Future Appointments Date Type Provider Dept  08/20/21 Appointment Gillis Santa, MD [facility]  Showing future appointments within next 90 days and meeting all other requirements I discussed the assessment and treatment plan with the patient. The patient was provided an opportunity to ask questions and all were answered. The patient agreed with the plan and demonstrated an understanding of the instructions.  Patient advised to call back or seek an in-person evaluation if the symptoms or condition worsens.  Duration of encounter:  88mnutes.  Note by: BGillis Santa MD Date: 07/11/2021; Time: 2:49 PM

## 2021-07-15 ENCOUNTER — Telehealth: Payer: Self-pay

## 2021-07-15 NOTE — Telephone Encounter (Signed)
Pharmacy needs an updated script for Lyrica since he told her to take 3 a day instead of 2 a day. Also she is having worse numbness in her hand/thumb.

## 2021-07-15 NOTE — Telephone Encounter (Signed)
Called [facility], they do not have the script for Pregabalin sent 07-11-21. I called it in verbally, patient  notified.

## 2021-07-16 LAB — COMPLIANCE DRUG ANALYSIS, UR

## 2021-07-29 ENCOUNTER — Telehealth: Payer: Self-pay

## 2021-07-29 NOTE — Telephone Encounter (Signed)
Patient has an appointment on 08-20-2021.  Is there anything else to offer her before then?

## 2021-07-29 NOTE — Telephone Encounter (Signed)
Patient notified that the MRI has been approved per Dr Cherylann Ratel (peer to peer) and that I would have Alona Bene look at it when she gets back and get it moving forward.

## 2021-08-01 ENCOUNTER — Ambulatory Visit
Admission: RE | Admit: 2021-08-01 | Discharge: 2021-08-01 | Disposition: A | Discharge status: Home / Self Care | Payer: PRIVATE HEALTH INSURANCE | Source: Ambulatory Visit | Attending: Student in an Organized Health Care Education/Training Program | Admitting: Student in an Organized Health Care Education/Training Program

## 2021-08-01 ENCOUNTER — Other Ambulatory Visit: Payer: Self-pay

## 2021-08-01 DIAGNOSIS — G894 Chronic pain syndrome: Secondary | ICD-10-CM | POA: Diagnosis present

## 2021-08-01 DIAGNOSIS — M542 Cervicalgia: Secondary | ICD-10-CM | POA: Insufficient documentation

## 2021-08-01 DIAGNOSIS — M5412 Radiculopathy, cervical region: Secondary | ICD-10-CM

## 2021-08-01 IMAGING — MR MR CERVICAL SPINE W/O CM
5 series · 37 of 48 positions shown · non-contrast
Comparison: No pertinent prior exams available for comparison.

CLINICAL DATA: Cervical radicular pain [AB] ([AB]-CM). Neck
pain, chronic, no prior imaging; Neck pain, chronic, degenerative
changes on xray; Cervical radiculopathy, no red flags; cervical
radiculopathy (left). Cervicalgia [AB] ([AB]-CM). Cervical
radiculitis [AB] ([AB]-CM). Chronic pain syndrome [AB]
([AB]-CM). Additional history provided by scanning technologist:
Patient reports neck pain which radiates into left shoulder and down
arm with numbness, numbness in fingers for 3 weeks.

EXAM:
MRI CERVICAL SPINE WITHOUT CONTRAST
TECHNIQUE: Multiplanar, multisequence MR imaging of the cervical spine was
performed. No intravenous contrast was administered.

[Series 5: T2 · sagittal · 3.0mm · 0.62mm/px · 7 of 15 slices shown (1 of 2)]
[im 1/15]
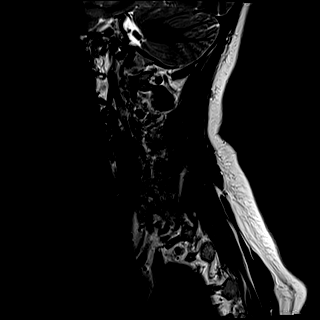
[im 3/15]
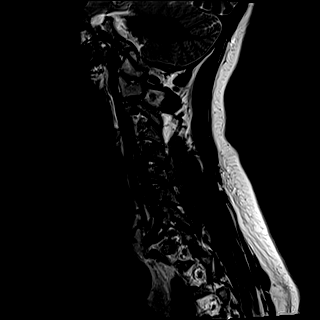
[im 5/15]
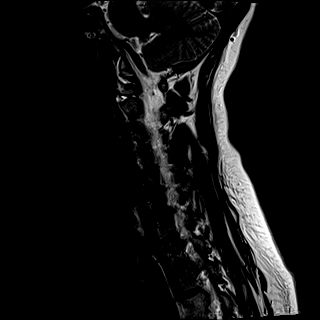
[im 8/15]
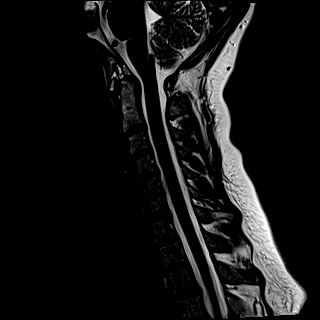
[im 10/15]
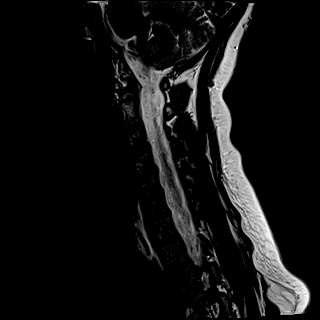
[im 12/15]
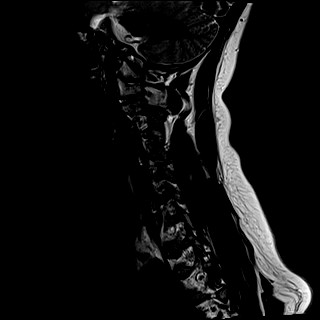
[im 15/15]
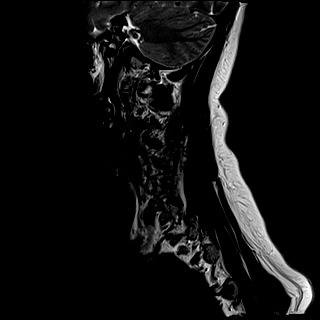

[Series 6: FLAIR · sagittal · 3.0mm · 0.78mm/px · 7 of 15 slices shown]
[im 1/15]
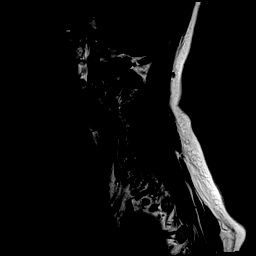
[im 3/15]
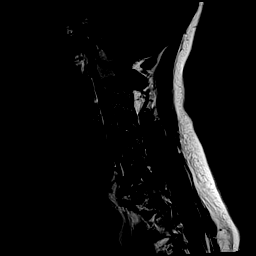
[im 5/15]
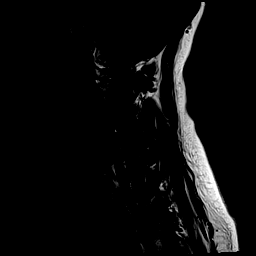
[im 8/15]
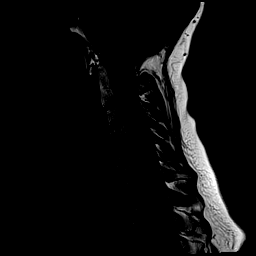
[im 10/15]
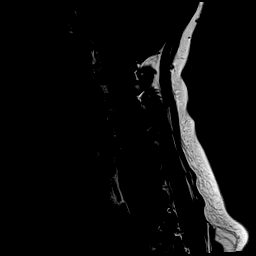
[im 12/15]
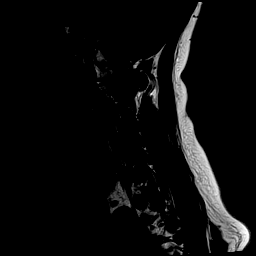
[im 15/15]
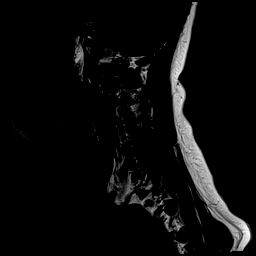

[Series 7: STIR · sagittal · 3.0mm · 0.62mm/px · 8 of 15 slices shown]
[im 1/15]
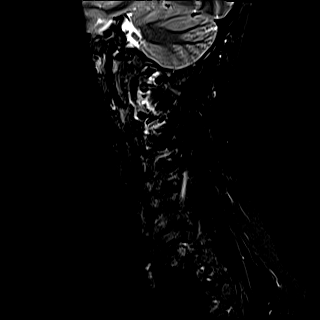
[im 3/15]
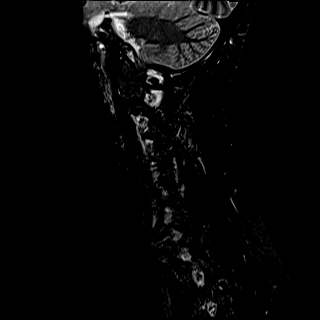
[im 5/15]
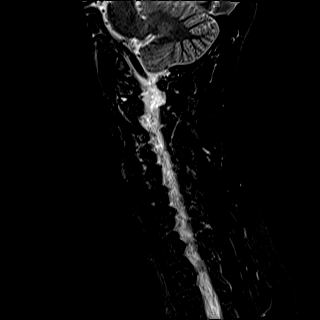
[im 7/15]
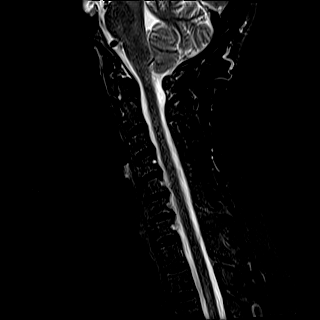
[im 9/15]
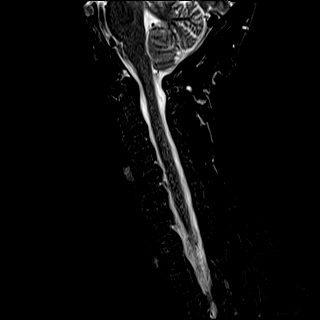
[im 11/15]
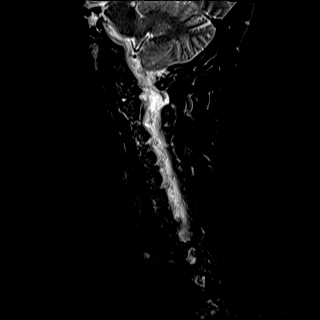
[im 13/15]
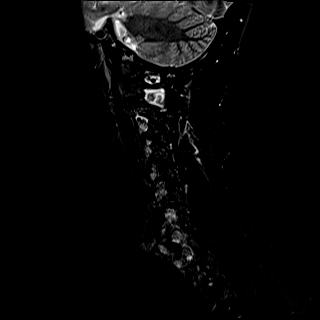
[im 15/15]
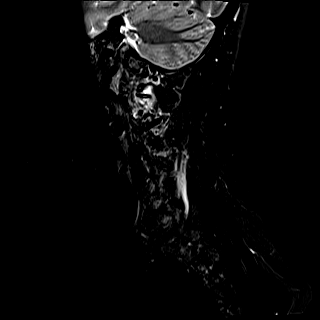

[Series 8: T2 · axial · 3.0mm · 0.70mm/px · z∈[-110,-28]mm · 9 of 25 slices shown (2 of 2)]
[im 1/25]
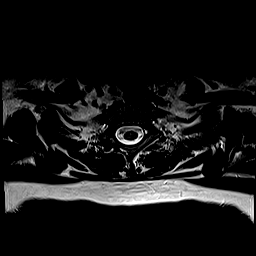
[im 5/25]
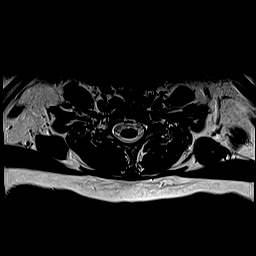
[im 9/25]
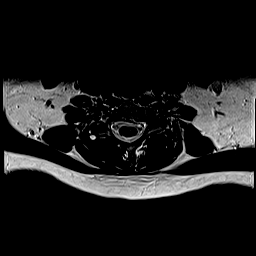
[im 11/25]
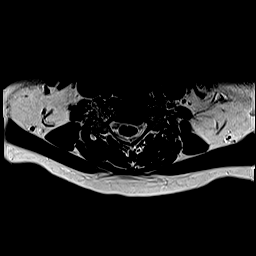
[im 13/25]
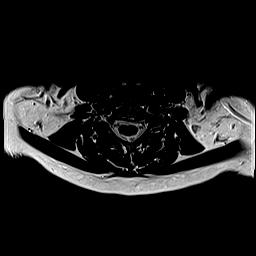
[im 15/25]
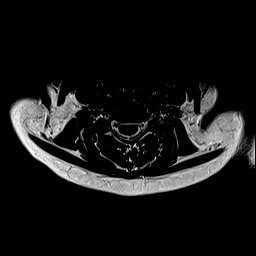
[im 17/25]
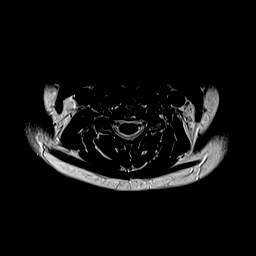
[im 21/25]
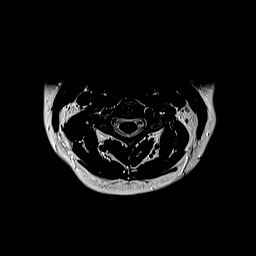
[im 25/25]
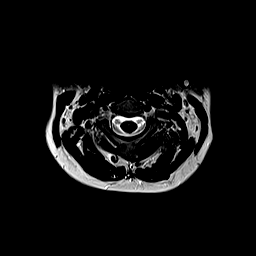

[Series 9: ax mpgr · axial · 3.0mm · 0.35mm/px · z∈[-110,-56]mm · 6 of 25 slices shown]
[im 1/25]
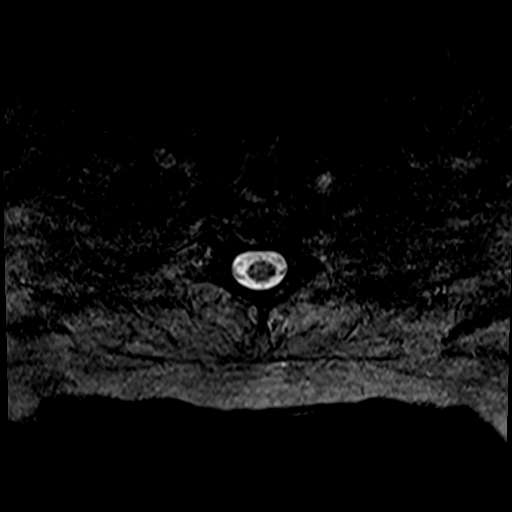
[im 5/25]
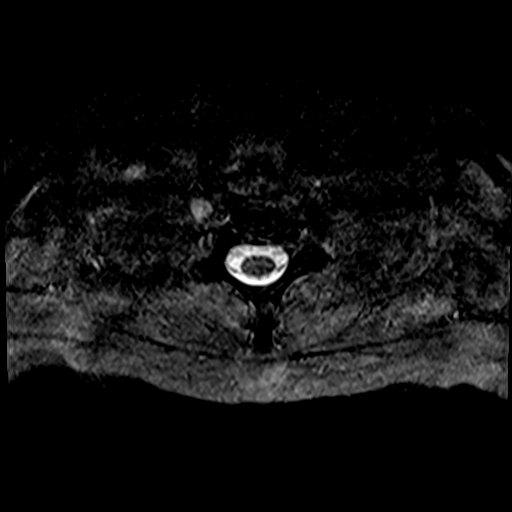
[im 9/25]
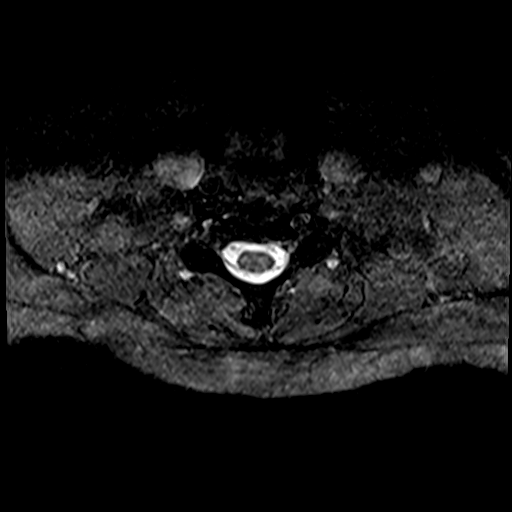
[im 11/25]
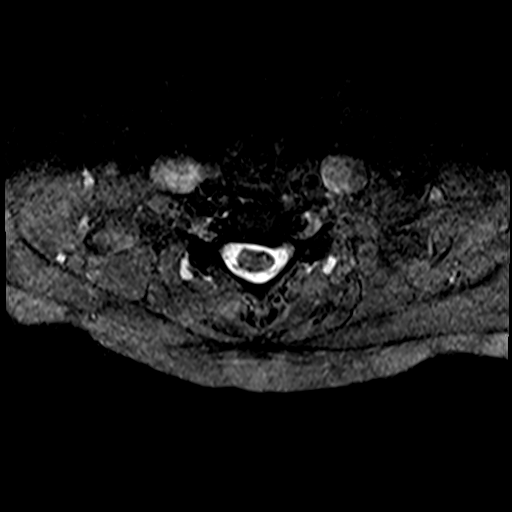
[im 15/25]
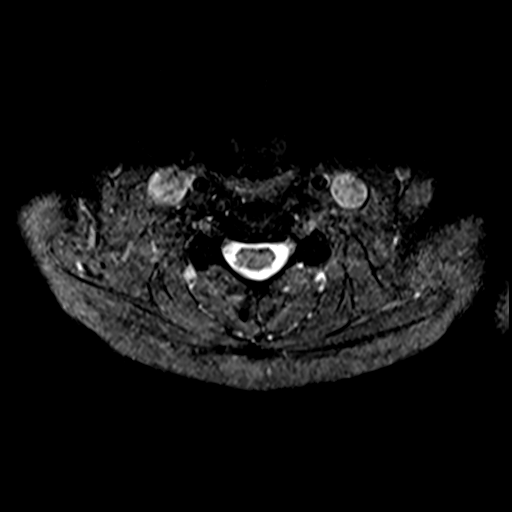
[im 17/25]
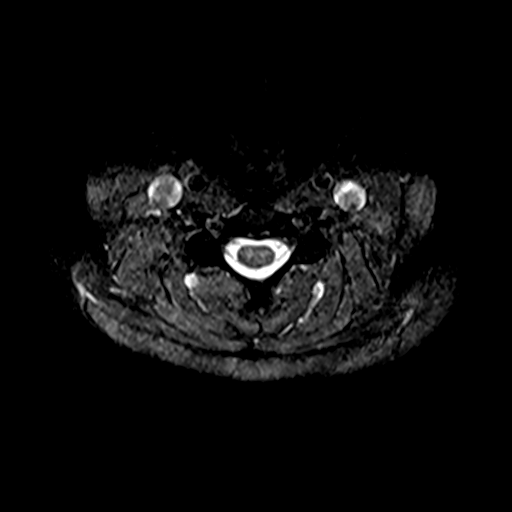

[37 of 48 positions shown; findings below may reference images not displayed]

FINDINGS: Alignment: Straightening of the expected cervical lordosis. No
significant spondylolisthesis.

Vertebrae: Vertebral body height is maintained. No significant
marrow edema or focal suspicious osseous lesion.

Cord: No spinal cord signal abnormality is identified.

Posterior Fossa, vertebral arteries, paraspinal tissues: No
abnormality identified within included portions of the posterior
fossa. Flow voids preserved within the imaged cervical vertebral
arteries. Paraspinal soft tissues unremarkable.

Disc levels:

Mild-to-moderate disc degeneration at C6-C7. No more than mild disc
degeneration at the remaining levels.

C2-C3: No significant disc herniation or stenosis.

C3-C4: No significant disc herniation or stenosis.

C4-C5: Shallow disc bulge. Bilateral uncovertebral hypertrophy. No
significant spinal canal or foraminal stenosis.

C5-C6: Disc bulge asymmetric to the left. Uncovertebral hypertrophy
on the left. Minimal facet arthrosis on the left. Mild partial
effacement of the ventral thecal sac (without spinal cord mass
effect). Moderate left neural foraminal narrowing with potential to
affect the exiting left C6 nerve root. No significant right
foraminal stenosis.

C6-C7: Left center/foraminal disc protrusion with associated
left-sided disc osteophyte ridge/uncinate hypertrophy. No
significant spinal canal stenosis. Moderate left neural foraminal
narrowing with potential two affect the exiting left C7 nerve root.
No significant right foraminal stenosis.

C7-T1: No significant disc herniation or stenosis.
IMPRESSION: Cervical spondylosis, as outlined and with findings most notably as
follows.

At C5-C6, there is mild disc degeneration. Disc bulge asymmetric to
the left. Associated left-sided uncovertebral hypertrophy. Minimal
facet arthrosis on the left. Mild partial effacement of the ventral
thecal sac (without spinal cord mass effect). Moderate left neural
foraminal narrowing with potential to affect the exiting left C6
nerve root. Correlate for left C6 radiculopathy.

At C6-C7, there is mild-to-moderate disc degeneration. Left
center/foraminal disc protrusion with associated left-sided disc
osteophyte ridge/uncinate hypertrophy. Moderate left neural
foraminal narrowing with potential to affect the exiting left C7
nerve root. Correlate for left C7 radiculopathy.

No significant spinal canal or foraminal stenosis at the remaining
levels.

Straightening of the expected cervical lordosis.

## 2021-08-06 ENCOUNTER — Other Ambulatory Visit: Payer: Self-pay

## 2021-08-06 ENCOUNTER — Ambulatory Visit
Payer: PRIVATE HEALTH INSURANCE | Attending: Student in an Organized Health Care Education/Training Program | Admitting: Student in an Organized Health Care Education/Training Program

## 2021-08-06 ENCOUNTER — Telehealth: Payer: Self-pay

## 2021-08-06 DIAGNOSIS — M542 Cervicalgia: Secondary | ICD-10-CM

## 2021-08-06 DIAGNOSIS — G894 Chronic pain syndrome: Secondary | ICD-10-CM

## 2021-08-06 DIAGNOSIS — M797 Fibromyalgia: Secondary | ICD-10-CM | POA: Diagnosis not present

## 2021-08-06 DIAGNOSIS — M5412 Radiculopathy, cervical region: Secondary | ICD-10-CM

## 2021-08-06 NOTE — Telephone Encounter (Signed)
Dr Cherylann Ratel, this patient wants the results of her MRI.  Looks like her next appointment is 9-6.  Would you like [place] to make her a virtual so that you can interpret the MRI for her before then?

## 2021-08-06 NOTE — Progress Notes (Signed)
Patient: Sandra Doyle  Service Category: E/M  Provider: Gillis Santa, MD  DOB: 06-Apr-1976  DOS: 08/06/2021  Location: Office  MRN: [id number]  Setting: Ambulatory outpatient  Referring Provider: Valera Castle, *  Type: Established Patient  Specialty: Interventional Pain Management  PCP: Valera Castle, MD  Location: Home  Delivery: TeleHealth     Virtual Encounter - Pain Management PROVIDER NOTE: Information contained herein reflects review and annotations entered in association with encounter. Interpretation of such information and data should be left to medically-trained personnel. Information provided to patient can be located elsewhere in the medical record under "Patient Instructions". Document created using STT-dictation technology, any transcriptional errors that may result from process are unintentional.    Contact & Pharmacy Preferred: [phone] Home: [phone] (home) Mobile: [phone] (mobile) E-mail: [email or web address]  [id number] [place] [address]Phone: [phone]: [phone]  Pre-screening  Ms. MJerelene Reddenoffered "in-person" vs "virtual" encounter. Sandra Doyle indicated preferring virtual for this encounter.   Reason COVID-19*  Social distancing based on CDC and AMA recommendations.   I contacted CZoriah Puliceon 08/06/2021 via video conference.      I clearly identified myself as BGillis Santa MD. I verified that I was speaking with the correct person using two identifiers (Name: CDelainee Tramel and date of birth: 128-Aug-1977.  Consent I sought verbal advanced consent from CLuther Parodyfor virtual visit interactions. I informed Ms. MLesperanceof possible security and privacy concerns, risks, and limitations associated with providing "not-in-person" medical evaluation and management services. I also informed Ms. MLasseigneof the availability of "in-person" appointments. Finally, I  informed her that there would be a charge for the virtual visit and that Sandra Doyle could be  personally, fully or partially, financially responsible for it. Ms. MShupingexpressed understanding and agreed to proceed.   Historic Elements   Ms. CCecilee Rosneris a 45y.o. year old, female patient evaluated today after our last contact on 07/11/2021. Sandra Doyle has a past medical history of Chronic kidney disease, Migraines, and SVT (supraventricular tachycardia) (HLee Mont. Sandra Doyle also  has a past surgical history that includes Abdominal hysterectomy; Cardiac surgery; Wrist surgery; and Cardiac electrophysiology mapping and ablation. Sandra Doyle a current medication list which includes the following prescription(s): acetaminophen, cetirizine, chlorhexidine, clobetasol ointment, duloxetine, epinephrine, folic acid, linaclotide, opzelura, prednisone, pregabalin, rosuvastatin, sod fluoride-potassium nitrate, sulfasalazine, sumatriptan, tacrolimus, and triamcinolone cream. Sandra Doyle  reports that Sandra Doyle has quit smoking. Her smoking use included cigarettes. Sandra Doyle smoked an average of .5 packs per day. Sandra Doyle has never used smokeless tobacco. Sandra Doyle reports current alcohol use. Sandra Doyle reports that Sandra Doyle does not use drugs. Ms. MPolandhas No Known Allergies.   HPI  Today, Sandra Doyle is being contacted for  discussion of MRI results for severe left cervical radicular pain  Sandra Doyle continues to have severe pain in her cervical region that radiates along her posterior shoulder down her medial bicep into her forearm and left hand in a dermatomal fashion.  Sandra Doyle is already been to urgent care for this and has been prescribed an oral steroid taper.  Sandra Doyle is having difficulty with fine motor control of her left forearm and hand.   Laboratory Chemistry Profile   Renal Lab Results  Component Value Date   BUN 16 08/17/2019   CREATININE 0.98 08/17/2019   GFRAA >60 08/17/2019   GFRNONAA >60 08/17/2019    Hepatic Lab Results  Component Value Date  AST 22  08/17/2019   ALT 21 08/17/2019   ALBUMIN 4.2 08/17/2019   ALKPHOS 88 08/17/2019    Electrolytes Lab Results  Component Value Date   NA 143 08/17/2019   K 3.2 (L) 08/17/2019   CL 107 08/17/2019   CALCIUM 10.2 08/17/2019    Bone No results found for: VD25OH, VD125OH2TOT, GU5427CW2, BJ6283TD1, 25OHVITD1, 25OHVITD2, 25OHVITD3, TESTOFREE, TESTOSTERONE  Inflammation (CRP: Acute Phase) (ESR: Chronic Phase) No results found for: CRP, ESRSEDRATE, LATICACIDVEN       Note: Above Lab results reviewed.  Imaging  MR CERVICAL SPINE WO CONTRAST CLINICAL DATA:  Cervical radicular pain M54.12 (ICD-10-CM). Neck pain, chronic, no prior imaging; Neck pain, chronic, degenerative changes on xray; Cervical radiculopathy, no red flags; cervical radiculopathy (left). Cervicalgia M54.2 (ICD-10-CM). Cervical radiculitis M54.12 (ICD-10-CM). Chronic pain syndrome G89.4 (ICD-10-CM). Additional history provided by scanning technologist: Patient reports neck pain which radiates into left shoulder and down arm with numbness, numbness in fingers for 3 weeks.  EXAM: MRI CERVICAL SPINE WITHOUT CONTRAST  TECHNIQUE: Multiplanar, multisequence MR imaging of the cervical spine was performed. No intravenous contrast was administered.  COMPARISON:  No pertinent prior exams available for comparison.  FINDINGS: Alignment: Straightening of the expected cervical lordosis. No significant spondylolisthesis.  Vertebrae: Vertebral body height is maintained. No significant marrow edema or focal suspicious osseous lesion.  Cord: No spinal cord signal abnormality is identified.  Posterior Fossa, vertebral arteries, paraspinal tissues: No abnormality identified within included portions of the posterior fossa. Flow voids preserved within the imaged cervical vertebral arteries. Paraspinal soft tissues unremarkable.  Disc levels:  Mild-to-moderate disc degeneration at C6-C7. No more than mild disc degeneration at the  remaining levels.  C2-C3: No significant disc herniation or stenosis.  C3-C4: No significant disc herniation or stenosis.  C4-C5: Shallow disc bulge. Bilateral uncovertebral hypertrophy. No significant spinal canal or foraminal stenosis.  C5-C6: Disc bulge asymmetric to the left. Uncovertebral hypertrophy on the left. Minimal facet arthrosis on the left. Mild partial effacement of the ventral thecal sac (without spinal cord mass effect). Moderate left neural foraminal narrowing with potential to affect the exiting left C6 nerve root. No significant right foraminal stenosis.  C6-C7: Left center/foraminal disc protrusion with associated left-sided disc osteophyte ridge/uncinate hypertrophy. No significant spinal canal stenosis. Moderate left neural foraminal narrowing with potential two affect the exiting left C7 nerve root. No significant right foraminal stenosis.  C7-T1: No significant disc herniation or stenosis.  IMPRESSION: Cervical spondylosis, as outlined and with findings most notably as follows.  At C5-C6, there is mild disc degeneration. Disc bulge asymmetric to the left. Associated left-sided uncovertebral hypertrophy. Minimal facet arthrosis on the left. Mild partial effacement of the ventral thecal sac (without spinal cord mass effect). Moderate left neural foraminal narrowing with potential to affect the exiting left C6 nerve root. Correlate for left C6 radiculopathy.  At C6-C7, there is mild-to-moderate disc degeneration. Left center/foraminal disc protrusion with associated left-sided disc osteophyte ridge/uncinate hypertrophy. Moderate left neural foraminal narrowing with potential to affect the exiting left C7 nerve root. Correlate for left C7 radiculopathy.  No significant spinal canal or foraminal stenosis at the remaining levels.  Straightening of the expected cervical lordosis.  Electronically Signed   By: Kellie Simmering D.O.   On: 08/02/2021  10:21  Reviewed MRI results with patient  Assessment  The primary encounter diagnosis was Cervical radicular pain (left) C6/C7. Diagnoses of Cervical radiculitis, Cervicalgia, Fibromyalgia, and Chronic pain syndrome were also pertinent to this visit.  Plan of  Care   Ms. Aina Rossbach has a current medication list which includes the following long-term medication(s): cetirizine, duloxetine, linaclotide, pregabalin, rosuvastatin, and sulfasalazine.  1.  Continue Lyrica and Cymbalta as prescribed 2.  Reviewed cervical MRI which suggested above left C6, C7 radiculitis.  Patient has tried and failed physical therapy.  Is having severe pain and difficulty with fine motor control.  Discussed cervical epidural steroid injection and referral to neurosurgery as well.  Orders:  Orders Placed This Encounter  Procedures   Cervical Epidural Injection    Level(s): C7-T1 Laterality: TBD Purpose: Diagnostic/Therapeutic Indication(s): Radiculitis and cervicalgia associater with cervical degenerative disc disease.    Standing Status:   Future    Standing Expiration Date:   09/06/2021    Scheduling Instructions:     Procedure: Cervical Epidural Steroid Injection/Block     Without     Timeframe: As soon as schedule allows    Order Specific Question:   Where will this procedure be performed?    Answer:   [facility]    Comments:   [place]   Ambulatory referral to Neurosurgery    Referral Priority:   Routine    Referral Type:   Surgical    Referral Reason:   Specialty Services Required    Referred to Provider:   Deetta Perla, MD    Requested Specialty:   Neurosurgery    Number of Visits Requested:   1   Follow-up plan:   Return in about 15 days (around 08/21/2021) for Cervical ESI, without sedation.    Recent Visits Date Type Provider Dept  07/11/21 Office Visit Gillis Santa, MD [facility]  Showing recent visits within past 90 days and meeting all other  requirements Today's Visits Date Type Provider Dept  08/06/21 Telemedicine Gillis Santa, MD [facility]  Showing today's visits and meeting all other requirements Future Appointments Date Type Provider Dept  08/20/21 Appointment Gillis Santa, MD [facility]  Showing future appointments within next 90 days and meeting all other requirements I discussed the assessment and treatment plan with the patient. The patient was provided an opportunity to ask questions and all were answered. The patient agreed with the plan and demonstrated an understanding of the instructions.  Patient advised to call back or seek an in-person evaluation if the symptoms or condition worsens.  Duration of encounter: 17mnutes.  Note by: BGillis Santa MD Date: 08/06/2021; Time: 3:20 PM

## 2021-08-06 NOTE — Telephone Encounter (Signed)
Pt states she is in a great amount of pain and ants to know if her MRI has been read.

## 2021-08-07 ENCOUNTER — Telehealth: Payer: Self-pay | Admitting: *Deleted

## 2021-08-07 NOTE — Telephone Encounter (Signed)
Phone call to patient after review of orders from [facility].  Caudal ESI ordered, no sedation.  Instructions reviewed.  Also, neurosurgery referral placed.

## 2021-08-20 ENCOUNTER — Ambulatory Visit
Payer: PRIVATE HEALTH INSURANCE | Attending: Student in an Organized Health Care Education/Training Program | Admitting: Student in an Organized Health Care Education/Training Program

## 2021-08-20 ENCOUNTER — Encounter: Payer: Self-pay | Admitting: Student in an Organized Health Care Education/Training Program

## 2021-08-20 ENCOUNTER — Other Ambulatory Visit: Payer: Self-pay

## 2021-08-20 VITALS — BP 163/100 | HR 102 | Temp 97.0°F | Resp 18 | Ht 64.0 in | Wt 160.0 lb

## 2021-08-20 DIAGNOSIS — R233 Spontaneous ecchymoses: Secondary | ICD-10-CM

## 2021-08-20 DIAGNOSIS — G894 Chronic pain syndrome: Secondary | ICD-10-CM | POA: Insufficient documentation

## 2021-08-20 DIAGNOSIS — M797 Fibromyalgia: Secondary | ICD-10-CM | POA: Insufficient documentation

## 2021-08-20 DIAGNOSIS — R238 Other skin changes: Secondary | ICD-10-CM | POA: Diagnosis present

## 2021-08-20 DIAGNOSIS — M5412 Radiculopathy, cervical region: Secondary | ICD-10-CM | POA: Insufficient documentation

## 2021-08-20 DIAGNOSIS — M542 Cervicalgia: Secondary | ICD-10-CM | POA: Insufficient documentation

## 2021-08-20 MED ORDER — HYDROCODONE-ACETAMINOPHEN 10-325 MG PO TABS: 1.0000 | ORAL_TABLET | Freq: Three times a day (TID) | ORAL | 0 refills | Status: AC | PRN

## 2021-08-20 NOTE — Progress Notes (Signed)
PROVIDER NOTE: Information contained herein reflects review and annotations entered in association with encounter. Interpretation of such information and data should be left to medically-trained personnel. Information provided to patient can be located elsewhere in the medical record under "Patient Instructions". Document created using STT-dictation technology, any transcriptional errors that may result from process are unintentional.    Patient: Sandra Doyle  Service Category: E/M  Provider: Gillis Santa, MD  DOB: 11/10/1976  DOS: 08/20/2021  Specialty: Interventional Pain Management  MRN: [id number]  Setting: Ambulatory outpatient  PCP: Valera Castle, MD  Type: Established Patient    Referring Provider: Valera Castle, *  Location: Office  Delivery: Face-to-face     HPI  Ms. Chabeli Barsamian, a 45 y.o. year old female, is here today because of her Cervical radicular pain [M54.12]. Ms. Scrima primary complain today is Back Pain (upper), Shoulder Pain (left), and Arm Pain (left) Last encounter: My last encounter with her was on 08/06/21/ Pertinent problems: Ms. Kochanski has Left lumbar radiculopathy; Sacral back pain; Fibromyalgia; Psoriatic arthritis ([place]); Chronic pain syndrome; and Chronic migraine without aura without status migrainosus, not intractable on their pertinent problem list. Pain Assessment: Severity of Chronic pain is reported as a 10-Worst pain ever/10. Location: Back Upper/radites into left shoulder and left arm.  Numbness in left fingers. Onset: More than a month ago. Quality: Numbness, Constant, Shooting. Timing: Constant. Modifying factor(s): nothing. Vitals:  height is 5' 4"  (1.626 m) and weight is 160 lb (72.6 kg). Her temperature is 97 F (36.1 C) (abnormal). Her blood pressure is 163/100 (abnormal) and her pulse is 102 (abnormal). Her respiration is 18 and oxygen saturation is 100%.   Reason for encounter:   Patient endorses severe pain lumbar cervical  region that radiates along her posterior shoulder down her left medial bicep into her forearm and left hand in a dermatomal fashion.  She presents today with her left arm AB ducted and internally rotated.  She is very uncomfortable.   She is having difficulty with fine motor control of her left forearm and hand.  She has numbness of her left middle ring and pinky finger.  Today I reviewed her lumbar MRI which shows left C6 foraminal stenosis and disc herniation consistent with left C6 radiculopathy.  Plan for cervical epidural steroid injection next week and referral to neurosurgery urgently.  Refill of hydrocodone as below for increased cervical radicular pain that is really impacting her quality of life and functional status.   ROS  Constitutional: Denies any fever or chills Gastrointestinal: No reported hemesis, hematochezia, vomiting, or acute GI distress Musculoskeletal: Denies any acute onset joint swelling, redness, loss of ROM, or weakness Neurological:  Left arm numbness and tingling  Medication Review  DULoxetine, EPINEPHrine, HYDROcodone-acetaminophen, Ruxolitinib Phosphate, SUMAtriptan, Sod Fluoride-Potassium Nitrate, acetaminophen, cetirizine, chlorhexidine, clobetasol ointment, folic acid, linaclotide, nortriptyline, pregabalin, rosuvastatin, sulfaSALAzine, tacrolimus, and triamcinolone cream  History Review  Allergy: Ms. Stanko has No Known Allergies. Drug: Ms. Trick  reports no history of drug use. Alcohol:  reports current alcohol use. Tobacco:  reports that she has quit smoking. Her smoking use included cigarettes. She smoked an average of .5 packs per day. She has never used smokeless tobacco. Social: Ms. Cossey  reports that she has quit smoking. Her smoking use included cigarettes. She smoked an average of .5 packs per day. She has never used smokeless tobacco. She reports current alcohol use. She reports that she does not use drugs. Medical:  has a past medical history of  Chronic kidney disease, Migraines, and SVT (supraventricular tachycardia) ([place]). Surgical: Ms. Dymond  has a past surgical history that includes Abdominal hysterectomy; Cardiac surgery; Wrist surgery; and Cardiac electrophysiology mapping and ablation. Family: family history includes Anuerysm in her father; Hypertension in her mother; Osteoarthritis in her mother; Stroke in her mother.  Laboratory Chemistry Profile   Renal Lab Results  Component Value Date   BUN 16 08/17/2019   CREATININE 0.98 08/17/2019   GFRAA >60 08/17/2019   GFRNONAA >60 08/17/2019    Hepatic Lab Results  Component Value Date   AST 22 08/17/2019   ALT 21 08/17/2019   ALBUMIN 4.2 08/17/2019   ALKPHOS 88 08/17/2019    Electrolytes Lab Results  Component Value Date   NA 143 08/17/2019   K 3.2 (L) 08/17/2019   CL 107 08/17/2019   CALCIUM 10.2 08/17/2019    Bone No results found for: VD25OH, VD125OH2TOT, EQ6834HD6, QI2979GX2, 25OHVITD1, 25OHVITD2, 25OHVITD3, TESTOFREE, TESTOSTERONE  Inflammation (CRP: Acute Phase) (ESR: Chronic Phase) No results found for: CRP, ESRSEDRATE, LATICACIDVEN       Note: Above Lab results reviewed.  Recent Imaging Review  MR CERVICAL SPINE WO CONTRAST CLINICAL DATA:  Cervical radicular pain M54.12 (ICD-10-CM). Neck pain, chronic, no prior imaging; Neck pain, chronic, degenerative changes on xray; Cervical radiculopathy, no red flags; cervical radiculopathy (left). Cervicalgia M54.2 (ICD-10-CM). Cervical radiculitis M54.12 (ICD-10-CM). Chronic pain syndrome G89.4 (ICD-10-CM). Additional history provided by scanning technologist: Patient reports neck pain which radiates into left shoulder and down arm with numbness, numbness in fingers for 3 weeks.  EXAM: MRI CERVICAL SPINE WITHOUT CONTRAST  TECHNIQUE: Multiplanar, multisequence MR imaging of the cervical spine was performed. No intravenous contrast was administered.  COMPARISON:  No pertinent prior exams available for  comparison.  FINDINGS: Alignment: Straightening of the expected cervical lordosis. No significant spondylolisthesis.  Vertebrae: Vertebral body height is maintained. No significant marrow edema or focal suspicious osseous lesion.  Cord: No spinal cord signal abnormality is identified.  Posterior Fossa, vertebral arteries, paraspinal tissues: No abnormality identified within included portions of the posterior fossa. Flow voids preserved within the imaged cervical vertebral arteries. Paraspinal soft tissues unremarkable.  Disc levels:  Mild-to-moderate disc degeneration at C6-C7. No more than mild disc degeneration at the remaining levels.  C2-C3: No significant disc herniation or stenosis.  C3-C4: No significant disc herniation or stenosis.  C4-C5: Shallow disc bulge. Bilateral uncovertebral hypertrophy. No significant spinal canal or foraminal stenosis.  C5-C6: Disc bulge asymmetric to the left. Uncovertebral hypertrophy on the left. Minimal facet arthrosis on the left. Mild partial effacement of the ventral thecal sac (without spinal cord mass effect). Moderate left neural foraminal narrowing with potential to affect the exiting left C6 nerve root. No significant right foraminal stenosis.  C6-C7: Left center/foraminal disc protrusion with associated left-sided disc osteophyte ridge/uncinate hypertrophy. No significant spinal canal stenosis. Moderate left neural foraminal narrowing with potential two affect the exiting left C7 nerve root. No significant right foraminal stenosis.  C7-T1: No significant disc herniation or stenosis.  IMPRESSION: Cervical spondylosis, as outlined and with findings most notably as follows.  At C5-C6, there is mild disc degeneration. Disc bulge asymmetric to the left. Associated left-sided uncovertebral hypertrophy. Minimal facet arthrosis on the left. Mild partial effacement of the ventral thecal sac (without spinal cord mass effect).  Moderate left neural foraminal narrowing with potential to affect the exiting left C6 nerve root. Correlate for left C6 radiculopathy.  At C6-C7, there is mild-to-moderate disc degeneration. Left center/foraminal disc protrusion with  associated left-sided disc osteophyte ridge/uncinate hypertrophy. Moderate left neural foraminal narrowing with potential to affect the exiting left C7 nerve root. Correlate for left C7 radiculopathy.  No significant spinal canal or foraminal stenosis at the remaining levels.  Straightening of the expected cervical lordosis.  Electronically Signed   By: Kellie Simmering D.O.   On: 08/02/2021 10:21 Note: Reviewed        Physical Exam  General appearance: alert, cooperative, and in moderate distress Mental status: Alert, oriented x 3 (person, place, & time)       Respiratory: No evidence of acute respiratory distress Eyes: PERLA Vitals: BP (!) 163/100 (BP Location: Right Arm, Patient Position: Sitting, Cuff Size: Normal)   Pulse (!) 102   Temp (!) 97 F (36.1 C)   Resp 18   Ht 5' 4"  (1.626 m)   Wt 160 lb (72.6 kg)   SpO2 100%   BMI 27.46 kg/m  BMI: Estimated body mass index is 27.46 kg/m as calculated from the following:   Height as of this encounter: 5' 4"  (1.626 m).   Weight as of this encounter: 160 lb (72.6 kg). Ideal: Ideal body weight: 54.7 kg (120 lb 9.5 oz) Adjusted ideal body weight: 61.9 kg (136 lb 5.7 oz)  Cervical Spine Area Exam  Skin & Axial Inspection: No masses, redness, edema, swelling, or associated skin lesions Alignment: Symmetrical Functional ROM: Pain restricted ROM      Stability: No instability detected Muscle Tone/Strength: Functionally intact. No obvious neuro-muscular anomalies detected. Sensory (Neurological): Dermatomal pain pattern Palpation: No palpable anomalies             Upper Extremity (UE) Exam    Side: Right upper extremity  Side: Left upper extremity  Skin & Extremity Inspection: Skin color,  temperature, and hair growth are WNL. No peripheral edema or cyanosis. No masses, redness, swelling, asymmetry, or associated skin lesions. No contractures.  Skin & Extremity Inspection: Skin color, temperature, and hair growth are WNL. No peripheral edema or cyanosis. No masses, redness, swelling, asymmetry, or associated skin lesions. No contractures.  Functional ROM: Unrestricted ROM          Functional ROM: Pain restricted ROM          Muscle Tone/Strength: Functionally intact. No obvious neuro-muscular anomalies detected.  Muscle Tone/Strength: Functionally intact. No obvious neuro-muscular anomalies detected.  Sensory (Neurological): Unimpaired          Sensory (Neurological): Dermatomal pain pattern left C6, left C7          Palpation: No palpable anomalies              Palpation: No palpable anomalies              Provocative Test(s):  Phalen's test: deferred Tinel's test: deferred Apley's scratch test (touch opposite shoulder):  Action 1 (Across chest): deferred Action 2 (Overhead): deferred Action 3 (LB reach): deferred   Provocative Test(s):  Phalen's test: deferred Tinel's test: deferred Apley's scratch test (touch opposite shoulder):  Action 1 (Across chest): Decreased ROM Action 2 (Overhead): Decreased ROM Action 3 (LB reach): Decreased ROM   Positive Spurling's on the right  4 out of 5 strength LEFT upper extremity: Shoulder abduction, elbow flexion, elbow extension, thumb extension. 5 out of 5 strength RIGHT upper extremity: Shoulder abduction, elbow flexion, elbow extension, thumb extension.    Assessment   Status Diagnosis  Having a Flare-up Having a Flare-up Having a Flare-up 1. Cervical radicular pain (left) C6/C7   2. Cervicalgia  3. Cervical radiculitis   4. Fibromyalgia   5. Chronic pain syndrome   6. Easy bruising       Plan of Care    Ms. Taquanna Borras has a current medication list which includes the following long-term medication(s):  cetirizine, duloxetine, linaclotide, nortriptyline, pregabalin, rosuvastatin, and sulfasalazine.  Pharmacotherapy (Medications Ordered): Meds ordered this encounter  Medications   HYDROcodone-acetaminophen (NORCO) 10-325 MG tablet    Sig: Take 1 tablet by mouth every 8 (eight) hours as needed for up to 15 days for severe pain.    Dispense:  45 tablet    Refill:  0    Chronic Pain: STOP Act (Not applicable) Fill 1 day early if closed on refill date. Avoid benzodiazepines within 8 hours of opioids    Orders:  Orders Placed This Encounter  Procedures   Cervical Epidural Injection    Sedation: Patient's choice. Purpose: Diagnostic/Therapeutic Indication(s): Radiculitis and cervicalgia associater with cervical degenerative disc disease.    Standing Status:   Future    Standing Expiration Date:   11/19/2021    Scheduling Instructions:     Procedure: Cervical Epidural Steroid Injection/Block     Level(s): C7-T1     Laterality: TBD     Timeframe: As soon as schedule allows    Order Specific Question:   Where will this procedure be performed?    Answer:   [facility]    Comments:   [place]   Ambulatory referral to Neurosurgery    Referral Priority:   Routine    Referral Type:   Surgical    Referral Reason:   Specialty Services Required    Referred to Provider:   Deetta Perla, MD    Requested Specialty:   Neurosurgery    Number of Visits Requested:   1    Follow-up plan:   Return in about 6 days (around 08/26/2021) for C-ESI , minimal sedation (PO Valium).    Recent Visits Date Type Provider Dept  08/06/21 Telemedicine Gillis Santa, MD [facility]  07/11/21 Office Visit Gillis Santa, MD [facility]  Showing recent visits within past 90 days and meeting all other requirements Today's Visits Date Type Provider Dept  08/20/21 Office Visit Gillis Santa, MD [facility]  Showing today's visits and meeting all other requirements Future  Appointments Date Type Provider Dept  08/26/21 Appointment Gillis Santa, MD [facility]  Showing future appointments within next 90 days and meeting all other requirements I discussed the assessment and treatment plan with the patient. The patient was provided an opportunity to ask questions and all were answered. The patient agreed with the plan and demonstrated an understanding of the instructions.  Patient advised to call back or seek an in-person evaluation if the symptoms or condition worsens.  Duration of encounter:  1mnutes.  Note by: BGillis Santa MD Date: 08/20/2021; Time: 1:08 PM

## 2021-08-20 NOTE — Progress Notes (Signed)
Safety precautions to be maintained throughout the outpatient stay will include: orient to surroundings, keep bed in low position, maintain call bell within reach at all times, provide assistance with transfer out of bed and ambulation.  

## 2021-08-20 NOTE — Patient Instructions (Signed)

## 2021-08-26 ENCOUNTER — Encounter: Payer: Self-pay | Admitting: Student in an Organized Health Care Education/Training Program

## 2021-08-26 ENCOUNTER — Ambulatory Visit
Admission: RE | Admit: 2021-08-26 | Discharge: 2021-08-26 | Disposition: A | Discharge status: Home / Self Care | Payer: PRIVATE HEALTH INSURANCE | Source: Ambulatory Visit | Attending: Student in an Organized Health Care Education/Training Program | Admitting: Student in an Organized Health Care Education/Training Program

## 2021-08-26 ENCOUNTER — Other Ambulatory Visit: Payer: Self-pay

## 2021-08-26 ENCOUNTER — Ambulatory Visit (HOSPITAL_BASED_OUTPATIENT_CLINIC_OR_DEPARTMENT_OTHER): Payer: PRIVATE HEALTH INSURANCE | Admitting: Student in an Organized Health Care Education/Training Program

## 2021-08-26 VITALS — BP 146/107 | HR 82 | Temp 97.0°F | Resp 12 | Ht 64.0 in | Wt 160.0 lb

## 2021-08-26 DIAGNOSIS — M5412 Radiculopathy, cervical region: Secondary | ICD-10-CM | POA: Diagnosis not present

## 2021-08-26 DIAGNOSIS — M797 Fibromyalgia: Secondary | ICD-10-CM

## 2021-08-26 DIAGNOSIS — G894 Chronic pain syndrome: Secondary | ICD-10-CM | POA: Insufficient documentation

## 2021-08-26 MED ORDER — DIAZEPAM 5 MG PO TABS
ORAL_TABLET | ORAL | Status: AC
  Filled 2021-08-26: qty 1

## 2021-08-26 MED ORDER — DIAZEPAM 5 MG PO TABS
5.0000 mg | ORAL_TABLET | ORAL | Status: AC
  Administered 2021-08-26: 5 mg via ORAL

## 2021-08-26 MED ORDER — DULOXETINE HCL 60 MG PO CPEP: 60.0000 mg | ORAL_CAPSULE | Freq: Every day | ORAL | 3 refills | Status: DC

## 2021-08-26 MED ORDER — IOHEXOL 180 MG/ML  SOLN
10.0000 mL | Freq: Once | INTRAMUSCULAR | Status: AC
  Administered 2021-08-26: 10 mL via EPIDURAL

## 2021-08-26 MED ORDER — SODIUM CHLORIDE 0.9% FLUSH
1.0000 mL | Freq: Once | INTRAVENOUS | Status: AC
  Administered 2021-08-26: 1 mL

## 2021-08-26 MED ORDER — ROPIVACAINE HCL 2 MG/ML IJ SOLN
1.0000 mL | Freq: Once | INTRAMUSCULAR | Status: AC
  Administered 2021-08-26: 1 mL via EPIDURAL

## 2021-08-26 MED ORDER — DEXAMETHASONE SODIUM PHOSPHATE 10 MG/ML IJ SOLN
10.0000 mg | Freq: Once | INTRAMUSCULAR | Status: AC
  Administered 2021-08-26: 10 mg

## 2021-08-26 MED ORDER — LIDOCAINE HCL (PF) 2 % IJ SOLN
INTRAMUSCULAR | Status: AC
  Filled 2021-08-26: qty 10

## 2021-08-26 MED ORDER — SODIUM CHLORIDE (PF) 0.9 % IJ SOLN
INTRAMUSCULAR | Status: AC
  Filled 2021-08-26: qty 10

## 2021-08-26 MED ORDER — LIDOCAINE HCL 2 % IJ SOLN
20.0000 mL | Freq: Once | INTRAMUSCULAR | Status: AC
  Administered 2021-08-26: 200 mg

## 2021-08-26 MED ORDER — DEXAMETHASONE SODIUM PHOSPHATE 10 MG/ML IJ SOLN
INTRAMUSCULAR | Status: AC
  Filled 2021-08-26: qty 1

## 2021-08-26 MED ORDER — IOHEXOL 180 MG/ML  SOLN
INTRAMUSCULAR | Status: AC
  Filled 2021-08-26: qty 20

## 2021-08-26 NOTE — Progress Notes (Signed)
Safety precautions to be maintained throughout the outpatient stay will include: orient to surroundings, keep bed in low position, maintain call bell within reach at all times, provide assistance with transfer out of bed and ambulation.  

## 2021-08-26 NOTE — Progress Notes (Signed)
PROVIDER NOTE: Interpretation of information contained herein should be left to medically-trained personnel. Specific patient instructions are provided elsewhere under "Patient Instructions" section of medical record. This document was created in part using STT-dictation technology, any transcriptional errors that may result from this process are unintentional.  Patient: Sandra Doyle Type: Established DOB: April 07, 1976 MRN: [id number] PCP: Sandra Housekeeper, MD  Service: Procedure DOS: 08/26/2021 Setting: Ambulatory Location: Ambulatory outpatient facility Delivery: Face-to-face Provider: Edward Jolly, MD Specialty: Interventional Pain Management Specialty designation: 09 Location: Outpatient facility Ref. Prov.: Sandra Doyle, *   Procedure [facility] Interventional Pain Management )    Procedure: Interlaminar Cervical Epidural Steroid injection (ESI) Laterality: Left Level: C7-T1 No.: 1 Series: n/a Purpose: Diagnostic/Therapeutic Indications: Cervicalgia, cervical radicular pain, degenerative disc disease, severe enough to greatly impact quality of life or function.   Imaging: Fluoroscopy-assisted Analgesia: Skin infiltration w/ local anesthetics Sedation: Minimal anxiolysis only. No sedation.  NAS-11 score:   Pre-procedure: 6 /10   Post-procedure: 0-No pain (moving neck and putting on shirt)/10      1. Cervical radicular pain (left) C6/C7   2. Cervical radiculitis   3. Fibromyalgia   4. Chronic pain syndrome   5. Cervical radicular pain    Pre-Procedure Preparation  Monitoring: As per clinic protocol. Respiration, ETCO2, SpO2, BP, heart rate and rhythm monitor placed and checked for adequate function  Risk Assessment: Vitals:  JXB:[id number] body mass index is 27.46 kg/m as calculated from the following:   Height as of this encounter: 5\' 4"  (1.626 m).   Weight as of this encounter: 160 lb (72.6 kg)., Rate:88 , BP:(!) 155/113, Resp:16, Temp:(!) 97 F (36.1 C),  SpO2:98 %  Allergies: She has No Known Allergies.  Precautions: None required  Blood-thinner(s): None at this time  Coagulopathies: Reviewed. None identified.   Active Infection(s): Reviewed. None identified. Sandra Doyle is afebrile   Location setting: Procedure suite Position: Prone, on modified reverse trendelenburg to facilitate breathing, with head in head-cradle. Pillows positioned under chest (below chin-level) with cervical spine flexed. Safety Precautions: Patient was assessed for positional comfort and pressure points before starting the procedure. Prepping solution: DuraPrep (Iodine Povacrylex [0.7% available iodine] and Isopropyl Alcohol, 74% w/w) Prep Area: Entire  cervicothoracic region Approach: percutaneous, paramedial Intended target: Posterior cervical epidural space Materials: Tray: Epidural Needle(s): Epidural (Tuohy) Qty: 1 Length: (13mm) 3.5-inch Gauge: 22G  3cc solution made of 1 cc of preservative-free saline, 1 cc of 0.2% ropivacaine, 1 cc of Decadron 10 mg/cc.    Meds ordered this encounter  Medications   iohexol (OMNIPAQUE) 180 MG/ML injection 10 mL    Must be Myelogram-compatible. If not available, you may substitute with a water-soluble, non-ionic, hypoallergenic, myelogram-compatible radiological contrast medium.   lidocaine (XYLOCAINE) 2 % (with pres) injection 400 mg   diazepam (VALIUM) tablet 5 mg    Make sure Flumazenil is available in the pyxis when using this medication. If oversedation occurs, administer 0.2 mg IV over 15 sec. If after 45 sec no response, administer 0.2 mg again over 1 min; may repeat at 1 min intervals; not to exceed 4 doses (1 mg)   ropivacaine (PF) 2 mg/mL (0.2%) (NAROPIN) injection 1 mL   sodium chloride flush (NS) 0.9 % injection 1 mL   dexamethasone (DECADRON) injection 10 mg   DULoxetine (CYMBALTA) 60 MG capsule    Sig: Take 1 capsule (60 mg total) by mouth daily.    Dispense:  30 capsule    Refill:  3    Orders Placed  This Encounter  Procedures   DG PAIN CLINIC C-ARM 1-60 MIN NO REPORT    Intraoperative interpretation by procedural physician at [facility].    Standing Status:   Standing    Number of Occurrences:   1    Order Specific Question:   Reason for exam:    Answer:   Assistance in needle guidance and placement for procedures requiring needle placement in or near specific anatomical locations not easily accessible without such assistance.     Time-out: 0831 I initiated and conducted the "Time-out" before starting the procedure, as per protocol. The patient was asked to participate by confirming the accuracy of the "Time Out" information. Verification of the correct person, site, and procedure were performed and confirmed by me, the nursing staff, and the patient. "Time-out" conducted as per Joint Commission's Universal Protocol (UP.01.01.01). Procedure checklist: Completed   H&P (Pre-op  Assessment)  Sandra Doyle is a 45 y.o. (year old), female patient, seen today for interventional treatment. She  has a past surgical history that includes Abdominal hysterectomy; Cardiac surgery; Wrist surgery; and Cardiac electrophysiology mapping and ablation. Sandra Doyle has a current medication list which includes the following prescription(s): acetaminophen, cetirizine, chlorhexidine, clobetasol ointment, epinephrine, folic acid, hydrocodone-acetaminophen, linaclotide, nortriptyline, opzelura, pregabalin, rosuvastatin, sod fluoride-potassium nitrate, sulfasalazine, sumatriptan, tacrolimus, triamcinolone cream, and duloxetine. Her primarily concern today is the Neck Pain (Left )  She has No Known Allergies.   Last encounter: My last encounter with her was on 08/20/2021. Pertinent problems: Sandra Doyle has Left lumbar radiculopathy; Sacral back pain; Fibromyalgia; Psoriatic arthritis (HCC); Chronic pain syndrome; and Chronic migraine without aura without status migrainosus, not intractable on their pertinent problem  list. Pain Assessment: Severity of Chronic pain is reported as a 6 /10. Location: Neck Left/into left shoulder and left arm. Onset: More than a month ago. Quality: Discomfort, Constant, Tingling, Numbness, Aching, Burning, Sharp, Dull. Timing: Constant. Modifying factor(s): nothing currently. Vitals:  height is 5\' 4"  (1.626 m) and weight is 160 lb (72.6 kg). Her temporal temperature is 97 F (36.1 C) (abnormal). Her blood pressure is 146/107 (abnormal) and her pulse is 82. Her respiration is 12 and oxygen saturation is 100%.   Reason for encounter: Interventional pain management therapy due pain of at least four (4) weeks in duration, with to failure to respond to and/or inability to tolerate more conservative care.   Related imaging: Cervical MR wo contrast:  Results for orders placed during the hospital encounter of 08/01/21  MR CERVICAL SPINE WO CONTRAST  Narrative CLINICAL DATA:  Cervical radicular pain M54.12 (ICD-10-CM). Neck pain, chronic, no prior imaging; Neck pain, chronic, degenerative changes on xray; Cervical radiculopathy, no red flags; cervical radiculopathy (left). Cervicalgia M54.2 (ICD-10-CM). Cervical radiculitis M54.12 (ICD-10-CM). Chronic pain syndrome G89.4 (ICD-10-CM). Additional history provided by scanning technologist: Patient reports neck pain which radiates into left shoulder and down arm with numbness, numbness in fingers for 3 weeks.  EXAM: MRI CERVICAL SPINE WITHOUT CONTRAST  TECHNIQUE: Multiplanar, multisequence MR imaging of the cervical spine was performed. No intravenous contrast was administered.  COMPARISON:  No pertinent prior exams available for comparison.  FINDINGS: Alignment: Straightening of the expected cervical lordosis. No significant spondylolisthesis.  Vertebrae: Vertebral body height is maintained. No significant marrow edema or focal suspicious osseous lesion.  Cord: No spinal cord signal abnormality is identified.  Posterior  Fossa, vertebral arteries, paraspinal tissues: No abnormality identified within included portions of the posterior fossa. Flow voids preserved within the imaged cervical vertebral arteries. Paraspinal  soft tissues unremarkable.  Disc levels:  Mild-to-moderate disc degeneration at C6-C7. No more than mild disc degeneration at the remaining levels.  C2-C3: No significant disc herniation or stenosis.  C3-C4: No significant disc herniation or stenosis.  C4-C5: Shallow disc bulge. Bilateral uncovertebral hypertrophy. No significant spinal canal or foraminal stenosis.  C5-C6: Disc bulge asymmetric to the left. Uncovertebral hypertrophy on the left. Minimal facet arthrosis on the left. Mild partial effacement of the ventral thecal sac (without spinal cord mass effect). Moderate left neural foraminal narrowing with potential to affect the exiting left C6 nerve root. No significant right foraminal stenosis.  C6-C7: Left center/foraminal disc protrusion with associated left-sided disc osteophyte ridge/uncinate hypertrophy. No significant spinal canal stenosis. Moderate left neural foraminal narrowing with potential two affect the exiting left C7 nerve root. No significant right foraminal stenosis.  C7-T1: No significant disc herniation or stenosis.  IMPRESSION: Cervical spondylosis, as outlined and with findings most notably as follows.  At C5-C6, there is mild disc degeneration. Disc bulge asymmetric to the left. Associated left-sided uncovertebral hypertrophy. Minimal facet arthrosis on the left. Mild partial effacement of the ventral thecal sac (without spinal cord mass effect). Moderate left neural foraminal narrowing with potential to affect the exiting left C6 nerve root. Correlate for left C6 radiculopathy.  At C6-C7, there is mild-to-moderate disc degeneration. Left center/foraminal disc protrusion with associated left-sided disc osteophyte ridge/uncinate hypertrophy.  Moderate left neural foraminal narrowing with potential to affect the exiting left C7 nerve root. Correlate for left C7 radiculopathy.  No significant spinal canal or foraminal stenosis at the remaining levels.  Straightening of the expected cervical lordosis.   Electronically Signed By: Jackey Loge D.O. On: 08/02/2021 10:21     Site Confirmation: Ms. Fanguy was asked to confirm the procedure and laterality before marking the site.  Consent: Before the procedure and under the influence of no sedative(s), amnesic(s), or anxiolytics, the patient was informed of the treatment options, risks and possible complications. To fulfill our ethical and legal obligations, as recommended by the American Medical Association's Code of Ethics, I have informed the patient of my clinical impression; the nature and purpose of the treatment or procedure; the risks, benefits, and possible complications of the intervention; the alternatives, including doing nothing; the risk(s) and benefit(s) of the alternative treatment(s) or procedure(s); and the risk(s) and benefit(s) of doing nothing. The patient was provided information about the general risks and possible complications associated with the procedure. These may include, but are not limited to: failure to achieve desired goals, infection, bleeding, organ or nerve damage, allergic reactions, paralysis, and death. In addition, the patient was informed of those risks and complications associated to Spine-related procedures, such as failure to decrease pain; infection (i.e.: Meningitis, epidural or intraspinal abscess); bleeding (i.e.: epidural hematoma, subarachnoid hemorrhage, or any other type of intraspinal or peri-dural bleeding); organ or nerve damage (i.e.: Any type of peripheral nerve, nerve root, or spinal cord injury) with subsequent damage to sensory, motor, and/or autonomic systems, resulting in permanent pain, numbness, and/or weakness of one or several  areas of the body; allergic reactions; (i.e.: anaphylactic reaction); and/or death. Furthermore, the patient was informed of those risks and complications associated with the medications. These include, but are not limited to: allergic reactions (i.e.: anaphylactic or anaphylactoid reaction(s)); adrenal axis suppression; blood sugar elevation that in diabetics may result in ketoacidosis or comma; water retention that in patients with history of congestive heart failure may result in shortness of breath, pulmonary edema, and  decompensation with resultant heart failure; weight gain; swelling or edema; medication-induced neural toxicity; particulate matter embolism and blood vessel occlusion with resultant organ, and/or nervous system infarction; and/or aseptic necrosis of one or more joints. Finally, the patient was informed that Medicine is not an exact science; therefore, there is also the possibility of unforeseen or unpredictable risks and/or possible complications that may result in a catastrophic outcome. The patient indicated having understood very clearly. We have given the patient no guarantees and we have made no promises. Enough time was given to the patient to ask questions, all of which were answered to the patient's satisfaction. Ms. Lebron has indicated that she wanted to continue with the procedure. Attestation: I, the ordering provider, attest that I have discussed with the patient the benefits, risks, side-effects, alternatives, likelihood of achieving goals, and potential problems during recovery for the procedure that I have provided informed consent.  Date  Time: 08/26/2021  8:01 AM    Description of procedure   Start Time: 0831 hrs  Local Anesthesia: Once the patient was positioned, prepped, and time-out was completed. The target area was identified located. The skin was marked with an approved surgical skin marker. Once marked, the skin (epidermis, dermis, and hypodermis), and deeper  tissues (fat, connective tissue and muscle) were infiltrated with a small amount of a short-acting local anesthetic, loaded on a 10cc syringe with a 25G, 1.5-in  Needle. An appropriate amount of time was allowed for local anesthetics to take effect before proceeding to the next step. Local Anesthetic: Lidocaine 1-2% The unused portion of the local anesthetic was discarded in the proper designated containers. Safety Precautions: Aspiration looking for blood return was conducted prior to all injections. At no point did I inject any substances, as a needle was being advanced. Before injecting, the patient was told to immediately notify me if she was experiencing any new onset of "ringing in the ears, or metallic taste in the mouth". No attempts were made at seeking any paresthesias. Safe injection practices and needle disposal techniques used. Medications properly checked for expiration dates. SDV (single dose vial) medications used. After the completion of the procedure, all disposable equipment used was discarded in the proper designated medical waste containers.  Technical description: Protocol guidelines were followed. Using fluoroscopic guidance, the epidural needle was introduced through the skin, ipsilateral to the reported pain, and advanced to the target area. Posterior laminar os was contacted and the needle walked caudad, until the lamina was cleared. The ligamentum flavum was engaged and the epidural space identified using "loss-of-resistance technique" with 2-3 ml of PF-NaCl (0.9% NSS), in a 5cc dedicated LOR syringe. See "Imaging guidance" below for use of contrast details.  Injection: Once satisfactory needle placement was confirmed, I proceeded to inject the desired solution in slow, incremental fashion, intermittently assessing for discomfort or any signs of abnormal or undesired spread of substance. Once completed, the needle was removed and disposed of, as per hospital protocols.   Vitals:    08/26/21 0825 08/26/21 0830 08/26/21 0835 08/26/21 0840  BP: (!) 171/118 (!) 165/111 (!) 149/115 (!) 146/107  Pulse: 83 84 86 82  Resp: Temp:      TempSrc:      SpO2: 98% 99% 100% 100%  Weight:      Height:        End Time: 0836 hrs  Once the entire procedure was completed, the treated area was cleaned, making sure to leave some of the prepping  solution back to take advantage of its long term bactericidal properties.   Imaging guidance  Type of Imaging Technique: Fluoroscopy Guidance (Spinal) Indication(s): Assistance in needle guidance and placement for procedures requiring needle placement in or near specific anatomical locations not easily accessible without such assistance. Exposure Time: Please see nurses notes for exact fluoroscopy time. Contrast: Before injecting any contrast, we confirmed that the patient did not have an allergy to iodine, shellfish, or radiological contrast. Once satisfactory needle placement was completed, radiological contrast was injected under continuous fluoroscopic guidance. Injection of contrast accomplished without complications. See chart for type and volume of contrast used. Fluoroscopic Guidance: I was personally present in the fluoroscopy suite, where the patient was placed in position for the procedure, over the fluoroscopy-compatible table. Fluoroscopy was manipulated, using "Tunnel Vision Technique", to obtain the best possible view of the target area, on the affected side. Parallax error was corrected before commencing the procedure. A "direction-depth-direction" technique was used to introduce the needle under continuous pulsed fluoroscopic guidance. Once the target was reached, antero-posterior, oblique, and lateral fluoroscopic projection views were taken to confirm needle placement in all planes. Electronic images uploaded into EMR.  Interpretation: Successful epidural injection. Intraoperative imaging interpretation by performing  Physician.    Post-op assessment  Post-procedure Vital Signs:  Pulse/HCG Rate: 82 (nsr)  Temp: (!) 97 F (36.1 C) Resp: 12 BP: (!) 146/107 SpO2: 100 %  EBL: None  Complications: No immediate post-treatment complications observed by team, or reported by patient.  Note: The patient tolerated the entire procedure well. A repeat set of vitals were taken after the procedure and the patient was kept under observation following institutional policy, for this type of procedure. Post-procedural neurological assessment was performed, showing return to baseline, prior to discharge. The patient was provided with post-procedure discharge instructions, including a section on how to identify potential problems. Should any problems arise concerning this procedure, the patient was given instructions to immediately contact us, at any time, without hesitation. In any case, we plan to contact the patient by telephone for a follow-up status report regarding this interventional procedure.  Comments:  No additional relevant information.   Plan of care   5 out of 5 strength bilateral upper extremity: Shoulder abduction, elbow flexion, elbow extension, thumb extension.   Medications administered: We administered iohexol, lidocaine, diazepam, ropivacaine (PF) 2 mg/mL (0.2%), sodium chloride flush, and dexamethasone.  Follow-up plan:   Return in about 4 weeks (around 09/23/2021) for Post Procedure Evaluation, virtual.       Recent Visits Date Type Provider Dept  08/20/21 Office Visit Sandra Jolly, MD [facility]  08/06/21 Telemedicine Sandra Jolly, MD [facility]  07/11/21 Office Visit Sandra Jolly, MD [facility]  Showing recent visits within past 90 days and meeting all other requirements Today's Visits Date Type Provider Dept  08/26/21 Procedure visit Sandra Jolly, MD [facility]  Showing today's visits and meeting all other requirements Future  Appointments Date Type Provider Dept  09/23/21 Appointment Sandra Jolly, MD [facility]  Showing future appointments within next 90 days and meeting all other requirements  Disposition: Discharge home  Discharge (Date  Time): 08/26/2021; 0844 hrs.   Primary Care Physician: Sandra Housekeeper, MD Location: [facility] Note by: Sandra Jolly, MD Date: 08/26/2021; Time: 9:13 AM  DISCLAIMER: Medicine is not an exact science. It has no guarantees or warranties. The decision to proceed with this intervention was based on the information collected from  the patient. Conclusions were drawn from the patient's questionnaire, interview, and examination. Because information was provided in large part by the patient, it cannot be guaranteed that it has not been purposely or unconsciously manipulated or altered. Every effort has been made to obtain as much accurate, relevant, available data as possible. Always take into account that the treatment will also be dependent on availability of resources and existing treatment guidelines, considered by other Pain Management Specialists as being common knowledge and practice, at the time of the intervention. It is also important to point out that variation in procedural techniques and pharmacological choices are the acceptable norm. For Medico-Legal review purposes, the indications, contraindications, technique, and results of the these procedures should only be evaluated, judged and interpreted by a Board-Certified Interventional Pain Specialist with extensive familiarity and expertise in the same exact procedure and technique.

## 2021-08-26 NOTE — Patient Instructions (Signed)

## 2021-08-27 ENCOUNTER — Telehealth: Payer: Self-pay | Admitting: *Deleted

## 2021-08-27 NOTE — Telephone Encounter (Signed)
Called for post procedure check. No answer. LVM. 

## 2021-09-11 ENCOUNTER — Other Ambulatory Visit: Payer: Self-pay | Admitting: Neurosurgery

## 2021-09-19 ENCOUNTER — Encounter: Payer: Self-pay | Admitting: Student in an Organized Health Care Education/Training Program

## 2021-09-23 ENCOUNTER — Ambulatory Visit
Payer: PRIVATE HEALTH INSURANCE | Attending: Student in an Organized Health Care Education/Training Program | Admitting: Student in an Organized Health Care Education/Training Program

## 2021-09-23 ENCOUNTER — Other Ambulatory Visit: Payer: Self-pay

## 2021-09-23 DIAGNOSIS — M797 Fibromyalgia: Secondary | ICD-10-CM

## 2021-09-23 DIAGNOSIS — M5412 Radiculopathy, cervical region: Secondary | ICD-10-CM

## 2021-09-23 DIAGNOSIS — G894 Chronic pain syndrome: Secondary | ICD-10-CM

## 2021-09-23 DIAGNOSIS — M542 Cervicalgia: Secondary | ICD-10-CM

## 2021-09-23 DIAGNOSIS — R233 Spontaneous ecchymoses: Secondary | ICD-10-CM | POA: Diagnosis not present

## 2021-09-23 MED ORDER — NORTRIPTYLINE HCL 10 MG PO CAPS: 20.0000 mg | ORAL_CAPSULE | Freq: Every day | ORAL | 3 refills | Status: AC

## 2021-09-23 MED ORDER — DULOXETINE HCL 60 MG PO CPEP: 60.0000 mg | ORAL_CAPSULE | Freq: Every day | ORAL | 3 refills | Status: AC

## 2021-09-23 MED ORDER — PREGABALIN 75 MG PO CAPS: 75.0000 mg | ORAL_CAPSULE | Freq: Three times a day (TID) | ORAL | 3 refills | Status: AC

## 2021-09-23 NOTE — Progress Notes (Signed)
Patient: Sandra Doyle  Service Category: E/M  Provider: Gillis Santa, MD  DOB: 02/13/1976  DOS: 09/23/2021  Location: Office  MRN: [id number]  Setting: Ambulatory outpatient  Referring Provider: Valera Castle, *  Type: Established Patient  Specialty: Interventional Pain Management  PCP: Valera Castle, MD  Location: Remote location  Delivery: TeleHealth     Virtual Encounter - Pain Management PROVIDER NOTE: Information contained herein reflects review and annotations entered in association with encounter. Interpretation of such information and data should be left to medically-trained personnel. Information provided to patient can be located elsewhere in the medical record under "Patient Instructions". Document created using STT-dictation technology, any transcriptional errors that may result from process are unintentional.    Contact & Pharmacy Preferred: [phone] Home: [phone] (home) Mobile: [phone] (mobile) E-mail: [email or web address]  [id number] [place] [id number]: [phone]: [phone]  Pre-screening  Ms. MJerelene Doyle "in-person" vs "virtual" encounter. She indicated preferring virtual for this encounter.   Reason COVID-19*  Social distancing based on CDC and AMA recommendations.   I contacted CLuther Parodyon 09/23/2021 via telephone.      I clearly identified myself as BGillis Santa MD. I verified that I was speaking with the correct person using two identifiers (Name: [name] and date of birth: [birth year].  Consent I sought verbal advanced consent from CLuther Parodyfor virtual visit interactions. I informed [name] possible security and privacy concerns, risks, and limitations associated with providing "not-in-person" medical evaluation and management services. I also informed [name] the availability of "in-person" appointments.  Finally, I informed her that there would be a charge for the virtual visit and that she could be  personally, fully or partially, financially responsible for it. [name] understanding and agreed to proceed.   Historic Elements   [name] a 45y.o. year old, female patient evaluated today after our last contact on 08/26/2021. [name] has a past medical history of Chronic kidney disease, Migraines, and SVT (supraventricular tachycardia) [place]. She also  has a past surgical history that includes Abdominal hysterectomy; Cardiac surgery; Wrist surgery; and Cardiac electrophysiology mapping and ablation. [name] a current medication list which includes the following prescription(s): acetaminophen, aripiprazole, cetirizine, chlorhexidine, clobetasol ointment, epinephrine, folic acid, linaclotide, opzelura, rosuvastatin, sulfasalazine, sumatriptan, tacrolimus, triamcinolone cream, duloxetine, fluticasone, nortriptyline, pregabalin, and sodium fluoride 5000 sensitive. She  reports that she has quit smoking. Her smoking use included cigarettes. She smoked an average of .5 packs per day. She has never used smokeless tobacco. She reports current alcohol use. She reports that she does not use drugs. Ms. MGingrichhas No Known Allergies.   HPI  Today, she is being contacted for a post-procedure assessment.   Post-Procedure Evaluation  Procedure (08/26/2021):  C7/T1 ESI Anxiolysis: Please see nurses note.  Effectiveness during initial hour after procedure (Ultra-Short Term Relief): 100 %   Local anesthetic used: Long-acting (4-6 hours) Effectiveness: Defined as any analgesic benefit obtained secondary to the administration of local anesthetics. This carries significant diagnostic value as to the etiological location, or anatomical origin, of the pain. Duration of benefit is expected to coincide with the duration of the local anesthetic used.  Effectiveness during initial 4-6 hours  after procedure (Short-Term Relief): 65 %   Long-term benefit: Defined as any relief past the pharmacologic duration of the local anesthetics.  Effectiveness past the  initial 6 hours after procedure (Long-Term Relief): 40 %   Benefits, current: Defined as benefit present at the time of this evaluation.   Analgesia:  40%    Laboratory Chemistry Profile   Renal Lab Results  Component Value Date   BUN 16 08/17/2019   CREATININE 0.98 08/17/2019   GFRAA >60 08/17/2019   GFRNONAA >60 08/17/2019    Hepatic Lab Results  Component Value Date   AST 22 08/17/2019   ALT 21 08/17/2019   ALBUMIN 4.2 08/17/2019   ALKPHOS 88 08/17/2019    Electrolytes Lab Results  Component Value Date   NA 143 08/17/2019   K 3.2 (L) 08/17/2019   CL 107 08/17/2019   CALCIUM 10.2 08/17/2019    Bone No results found for: VD25OH, VD125OH2TOT, PX1062IR4, WN4627OJ5, 25OHVITD1, 25OHVITD2, 25OHVITD3, TESTOFREE, TESTOSTERONE  Inflammation (CRP: Acute Phase) (ESR: Chronic Phase) No results found for: CRP, ESRSEDRATE, LATICACIDVEN       Note: Above Lab results reviewed.  Assessment  The primary encounter diagnosis was Cervical radicular pain (left) C6/C7. Diagnoses of Cervical radiculitis, Fibromyalgia, Cervicalgia, Easy bruising, Chronic pain syndrome, and Cervical radicular pain (left) were also pertinent to this visit.  Plan of Care   -s/p C-ESI, helped and is still noticing some benefit from injection but pain is returning -ROM slightly improved since C-ESI but still having difficulty with overhand reach -Saw Dr Lacinda Axon- plan for C5-C7 ACDF 10/07/21 -working with PT -Refill of cymbalta, nortriptyline, and lyrica as below  -post op pain management per surgical team, patient is NOT on chronic opioid therapy -follow up in 4 months  Follow-up plan:   Return in about 4 months (around 01/24/2022) for Medication Management, in person.      Recent Visits Date Type Provider Dept  08/26/21 Procedure visit  Gillis Santa, MD [facility]  08/20/21 Office Visit Gillis Santa, MD [facility]  08/06/21 Telemedicine Gillis Santa, MD [facility]  07/11/21 Office Visit Gillis Santa, MD [facility]  Showing recent visits within past 90 days and meeting all other requirements Today's Visits Date Type Provider Dept  09/23/21 Office Visit Gillis Santa, MD [facility]  Showing today's visits and meeting all other requirements Future Appointments No visits were found meeting these conditions. Showing future appointments within next 90 days and meeting all other requirements I discussed the assessment and treatment plan with the patient. The patient was provided an opportunity to ask questions and all were answered. The patient agreed with the plan and demonstrated an understanding of the instructions.  Patient advised to call back or seek an in-person evaluation if the symptoms or condition worsens.  Duration of encounter: 76mnutes.  Note by: BGillis Santa MD Date: 09/23/2021; Time: 10:13 AM

## 2021-09-26 ENCOUNTER — Other Ambulatory Visit: Payer: Self-pay

## 2021-09-26 ENCOUNTER — Encounter
Admission: RE | Admit: 2021-09-26 | Discharge: 2021-09-26 | Disposition: A | Discharge status: Home / Self Care | Payer: PRIVATE HEALTH INSURANCE | Source: Ambulatory Visit | Attending: Neurosurgery | Admitting: Neurosurgery

## 2021-09-26 DIAGNOSIS — Z01812 Encounter for preprocedural laboratory examination: Secondary | ICD-10-CM | POA: Insufficient documentation

## 2021-09-26 HISTORY — DX: Chronic pain syndrome: G89.4

## 2021-09-26 HISTORY — DX: Palpitations: R00.2

## 2021-09-26 HISTORY — DX: Orthostatic hypotension: I95.1

## 2021-09-26 HISTORY — DX: Arthropathic psoriasis, unspecified: L40.50

## 2021-09-26 HISTORY — DX: Carpal tunnel syndrome, unspecified upper limb: G56.00

## 2021-09-26 HISTORY — DX: Other chest pain: R07.89

## 2021-09-26 HISTORY — DX: Synovitis and tenosynovitis, unspecified: M65.9

## 2021-09-26 HISTORY — DX: Migraine without aura, not intractable, without status migrainosus: G43.009

## 2021-09-26 HISTORY — DX: Essential (primary) hypertension: I10

## 2021-09-26 HISTORY — DX: Nonrheumatic mitral (valve) prolapse: I34.1

## 2021-09-26 HISTORY — DX: Unspecified synovitis and tenosynovitis, unspecified site: M65.90

## 2021-09-26 HISTORY — DX: Fibromyalgia: M79.7

## 2021-09-26 HISTORY — DX: Radiculopathy, lumbar region: M54.16

## 2021-09-26 HISTORY — DX: Pure hypercholesterolemia, unspecified: E78.00

## 2021-09-26 HISTORY — DX: Sacrococcygeal disorders, not elsewhere classified: M53.3

## 2021-09-26 NOTE — Patient Instructions (Signed)
Your procedure is scheduled on: 10/07/2021 Report to the Registration Desk on the 1st floor of the [facility]. To find out your arrival time, please call [phone] between 1PM - 3PM on: 10/04/2021  REMEMBER: Instructions that are not followed completely may result in serious medical risk, up to and including death; or upon the discretion of your surgeon and anesthesiologist your surgery may need to be rescheduled.  Do not eat food after midnight the night before surgery.  No gum chewing, lozengers or hard candies.  You may however, drink CLEAR liquids up to 2 hours before you are scheduled to arrive for your surgery. Do not drink anything within 2 hours of your scheduled arrival time.  Clear liquids include: - water  - apple juice without pulp - gatorade  - black coffee or tea (Do NOT add milk or creamers to the coffee or tea) Do NOT drink anything that is not on this list.  TAKE THESE MEDICATIONS THE MORNING OF SURGERY WITH A SIP OF WATER: - cetirizine (ZYRTEC) 10 MG - DULoxetine (CYMBALTA) 60 MG - fluticasone (FLONASE) 50 MCG/ACT  One week prior to surgery: Stop Anti-inflammatories (NSAIDS) such as Advil, Aleve, Ibuprofen, Motrin, Naproxen, Naprosyn and Aspirin based products such as Excedrin, Goodys Powder, BC Powder. You may however, continue to take Tylenol if needed for pain up until the day of surgery. Stop ANY OVER THE COUNTER supplements until after surgery.   No Alcohol for 24 hours before or after surgery.  No Smoking including e-cigarettes for 24 hours prior to surgery.  No chewable tobacco products for at least 6 hours prior to surgery.  No nicotine patches on the day of surgery.  Do not use any "recreational" drugs for at least a week prior to your surgery.  Please be advised that the combination of cocaine and anesthesia may have negative outcomes, up to and including death. If you test positive for cocaine, your surgery will be cancelled.  On the morning  of surgery brush your teeth with toothpaste and water, you may rinse your mouth with mouthwash if you wish. Do not swallow any toothpaste or mouthwash.  Use CHG Soap or wipes as directed on instruction sheet.  Do not wear jewelry, make-up, hairpins, clips or nail polish.  Do not wear lotions, powders, or perfumes.   Do not shave body from the neck down 48 hours prior to surgery just in case you cut yourself which could leave a site for infection.  Also, freshly shaved skin may become irritated if using the CHG soap.  Do not bring valuables to the hospital. [facility] is not responsible for any missing/lost belongings or valuables.   Notify your doctor if there is any change in your medical condition (cold, fever, infection).  Wear comfortable clothing (specific to your surgery type) to the hospital.  If you are being admitted to the hospital overnight, leave your suitcase in the car. After surgery it may be brought to your room.  If you are being discharged the day of surgery, you will not be allowed to drive home. You will need a responsible adult (18 years or older) to drive you home and stay with you that night.   If you are taking public transportation, you will need to have a responsible adult (18 years or older) with you. Please confirm with your physician that it is acceptable to use public transportation.   Please call the Pre-admissions Testing Dept. at [phone] if you have any questions about  these instructions.  Surgery Visitation Policy:  Patients undergoing a surgery or procedure may have one family member or support person with them as long as that person is not COVID-19 positive or experiencing its symptoms.  That person may remain in the waiting area during the procedure and may rotate out with other people.  Inpatient Visitation:    Visiting hours are 7 a.m. to 8 p.m. Up to two visitors ages 16+ are allowed at one time in a patient room. The visitors may  rotate out with other people during the day. Visitors must check out when they leave, or other visitors will not be allowed. One designated support person may remain overnight. The visitor must pass COVID-19 screenings, use hand sanitizer when entering and exiting the patient's room and wear a mask at all times, including in the patient's room. Patients must also wear a mask when staff or their visitor are in the room. Masking is required regardless of vaccination status.

## 2021-09-27 ENCOUNTER — Encounter
Admission: RE | Admit: 2021-09-27 | Discharge: 2021-09-27 | Disposition: A | Discharge status: Home / Self Care | Payer: PRIVATE HEALTH INSURANCE | Source: Ambulatory Visit | Attending: Neurosurgery | Admitting: Neurosurgery

## 2021-09-27 DIAGNOSIS — Z01812 Encounter for preprocedural laboratory examination: Secondary | ICD-10-CM | POA: Diagnosis present

## 2021-09-27 LAB — CBC
HCT: 35.2 % — ABNORMAL LOW (ref 36.0–46.0)
Hemoglobin: 12 g/dL (ref 12.0–15.0)
MCH: 33.3 pg (ref 26.0–34.0)
MCHC: 34.1 g/dL (ref 30.0–36.0)
MCV: 97.8 fL (ref 80.0–100.0)
Platelets: 299 10*3/uL (ref 150–400)
RBC: 3.6 MIL/uL — ABNORMAL LOW (ref 3.87–5.11)
RDW: 12.9 % (ref 11.5–15.5)
WBC: 5.5 10*3/uL (ref 4.0–10.5)
nRBC: 0 % (ref 0.0–0.2)

## 2021-09-27 LAB — URINALYSIS, ROUTINE W REFLEX MICROSCOPIC
Bacteria, UA: NONE SEEN
Bilirubin Urine: NEGATIVE
Glucose, UA: NEGATIVE mg/dL
Ketones, ur: NEGATIVE mg/dL
Leukocytes,Ua: NEGATIVE
Nitrite: NEGATIVE
Protein, ur: NEGATIVE mg/dL
Specific Gravity, Urine: 1.019 (ref 1.005–1.030)
pH: 6 (ref 5.0–8.0)

## 2021-09-27 LAB — BASIC METABOLIC PANEL
Anion gap: 9 (ref 5–15)
BUN: 11 mg/dL (ref 6–20)
CO2: 29 mmol/L (ref 22–32)
Calcium: 9.4 mg/dL (ref 8.9–10.3)
Chloride: 103 mmol/L (ref 98–111)
Creatinine, Ser: 0.87 mg/dL (ref 0.44–1.00)
GFR, Estimated: >60 mL/min (ref >60)
Glucose, Bld: 92 mg/dL (ref 70–99)
Potassium: 3.6 mmol/L (ref 3.5–5.1)
Sodium: 141 mmol/L (ref 135–145)

## 2021-09-27 LAB — SURGICAL PCR SCREEN
MRSA, PCR: NEGATIVE
Staphylococcus aureus: NEGATIVE

## 2021-09-27 LAB — APTT: aPTT: 32 seconds (ref 24–36)

## 2021-09-27 LAB — PROTIME-INR
INR: 1 (ref 0.8–1.2)
Prothrombin Time: 12.8 seconds (ref 11.4–15.2)

## 2021-09-27 LAB — TYPE AND SCREEN
ABO/RH(D): A POS
Antibody Screen: NEGATIVE

## 2021-10-03 ENCOUNTER — Other Ambulatory Visit: Admission: RE | Admit: 2021-10-03 | Payer: PRIVATE HEALTH INSURANCE | Source: Ambulatory Visit

## 2021-10-04 ENCOUNTER — Other Ambulatory Visit
Admission: RE | Admit: 2021-10-04 | Discharge: 2021-10-04 | Disposition: A | Discharge status: Home / Self Care | Payer: PRIVATE HEALTH INSURANCE | Source: Ambulatory Visit | Attending: Neurosurgery | Admitting: Neurosurgery

## 2021-10-04 ENCOUNTER — Other Ambulatory Visit: Payer: Self-pay

## 2021-10-04 DIAGNOSIS — Z01812 Encounter for preprocedural laboratory examination: Secondary | ICD-10-CM | POA: Diagnosis not present

## 2021-10-04 DIAGNOSIS — Z20822 Contact with and (suspected) exposure to covid-19: Secondary | ICD-10-CM | POA: Insufficient documentation

## 2021-10-04 LAB — SARS CORONAVIRUS 2 (TAT 6-24 HRS): SARS Coronavirus 2: NEGATIVE

## 2021-10-04 NOTE — Progress Notes (Signed)
No show as of yet for covid testing. Call to Dr. Adriana Simas office to inform of patient not showing for covid test and that covid testing is closing in 45 minutes...(@12  pm).

## 2021-10-04 NOTE — Progress Notes (Signed)
No show for covid test yesterday 10/03/21. Call to patient, left message on voice mail stating that a covid test is required prior to scheduled surgery on Monday and that the covid testing site is open today from 8 am-12pm.

## 2021-10-07 ENCOUNTER — Other Ambulatory Visit: Payer: Self-pay

## 2021-10-07 ENCOUNTER — Observation Stay
Admission: RE | Admit: 2021-10-07 | Discharge: 2021-10-08 | Disposition: A | Discharge status: Home / Self Care | Payer: PRIVATE HEALTH INSURANCE | Attending: Neurosurgery | Admitting: Neurosurgery

## 2021-10-07 ENCOUNTER — Encounter
Admission: RE | Disposition: A | Discharge status: Home / Self Care | Payer: Self-pay | Source: Home / Self Care | Attending: Neurosurgery

## 2021-10-07 ENCOUNTER — Encounter: Payer: Self-pay | Admitting: Neurosurgery

## 2021-10-07 ENCOUNTER — Ambulatory Visit: Payer: PRIVATE HEALTH INSURANCE | Admitting: Registered Nurse

## 2021-10-07 ENCOUNTER — Ambulatory Visit: Payer: PRIVATE HEALTH INSURANCE

## 2021-10-07 DIAGNOSIS — N182 Chronic kidney disease, stage 2 (mild): Secondary | ICD-10-CM | POA: Diagnosis not present

## 2021-10-07 DIAGNOSIS — Z87891 Personal history of nicotine dependence: Secondary | ICD-10-CM | POA: Diagnosis not present

## 2021-10-07 DIAGNOSIS — M5412 Radiculopathy, cervical region: Secondary | ICD-10-CM | POA: Diagnosis not present

## 2021-10-07 DIAGNOSIS — Z79899 Other long term (current) drug therapy: Secondary | ICD-10-CM | POA: Insufficient documentation

## 2021-10-07 DIAGNOSIS — Z419 Encounter for procedure for purposes other than remedying health state, unspecified: Secondary | ICD-10-CM

## 2021-10-07 DIAGNOSIS — I129 Hypertensive chronic kidney disease with stage 1 through stage 4 chronic kidney disease, or unspecified chronic kidney disease: Secondary | ICD-10-CM | POA: Diagnosis not present

## 2021-10-07 HISTORY — PX: CERVICAL DISC ARTHROPLASTY: SHX587

## 2021-10-07 IMAGING — RF DG C-ARM 1-60 MIN
1 series · 2 of 2 positions shown · non-contrast
Comparison: MRI cervical spine [DATE].

CLINICAL DATA: surgery

EXAM:
DG C-ARM 1-60 MIN; CERVICAL SPINE - 2-3 VIEW
FLUOROSCOPY TIME:  Fluoroscopy Time:  33 seconds
Number of Acquired Spot Images: 2

[Series 1: dg x-ray · 0.20mm/px · 2 of 2 slices shown]
[im 1/2]
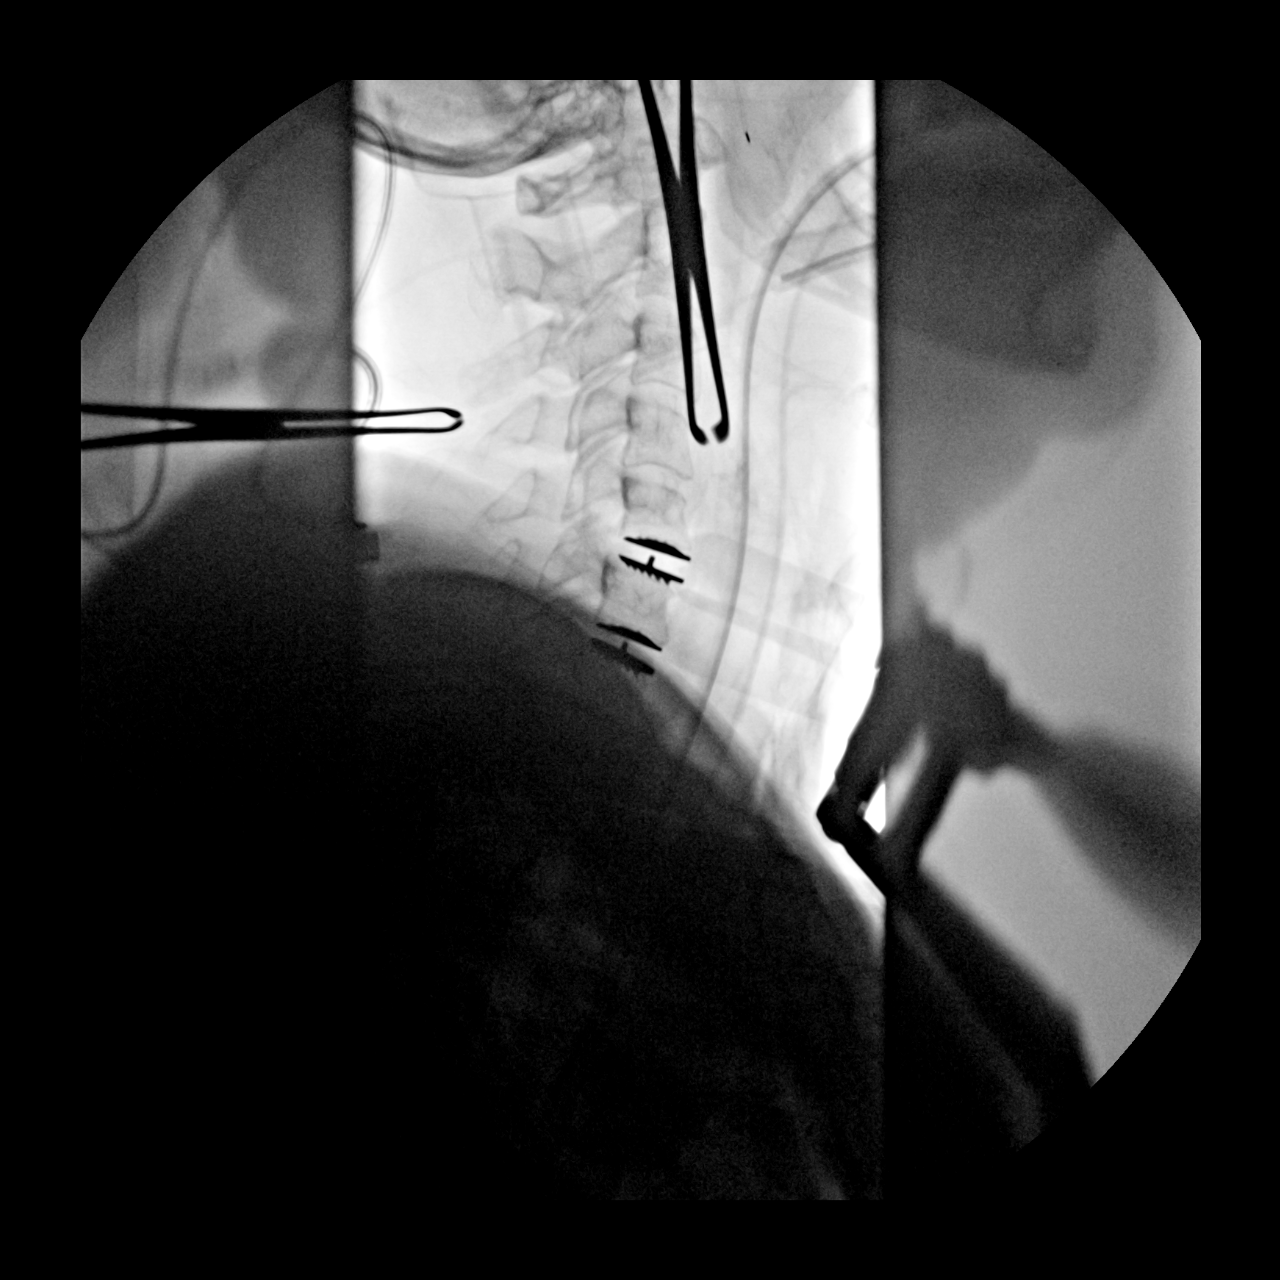
[im 2/2]
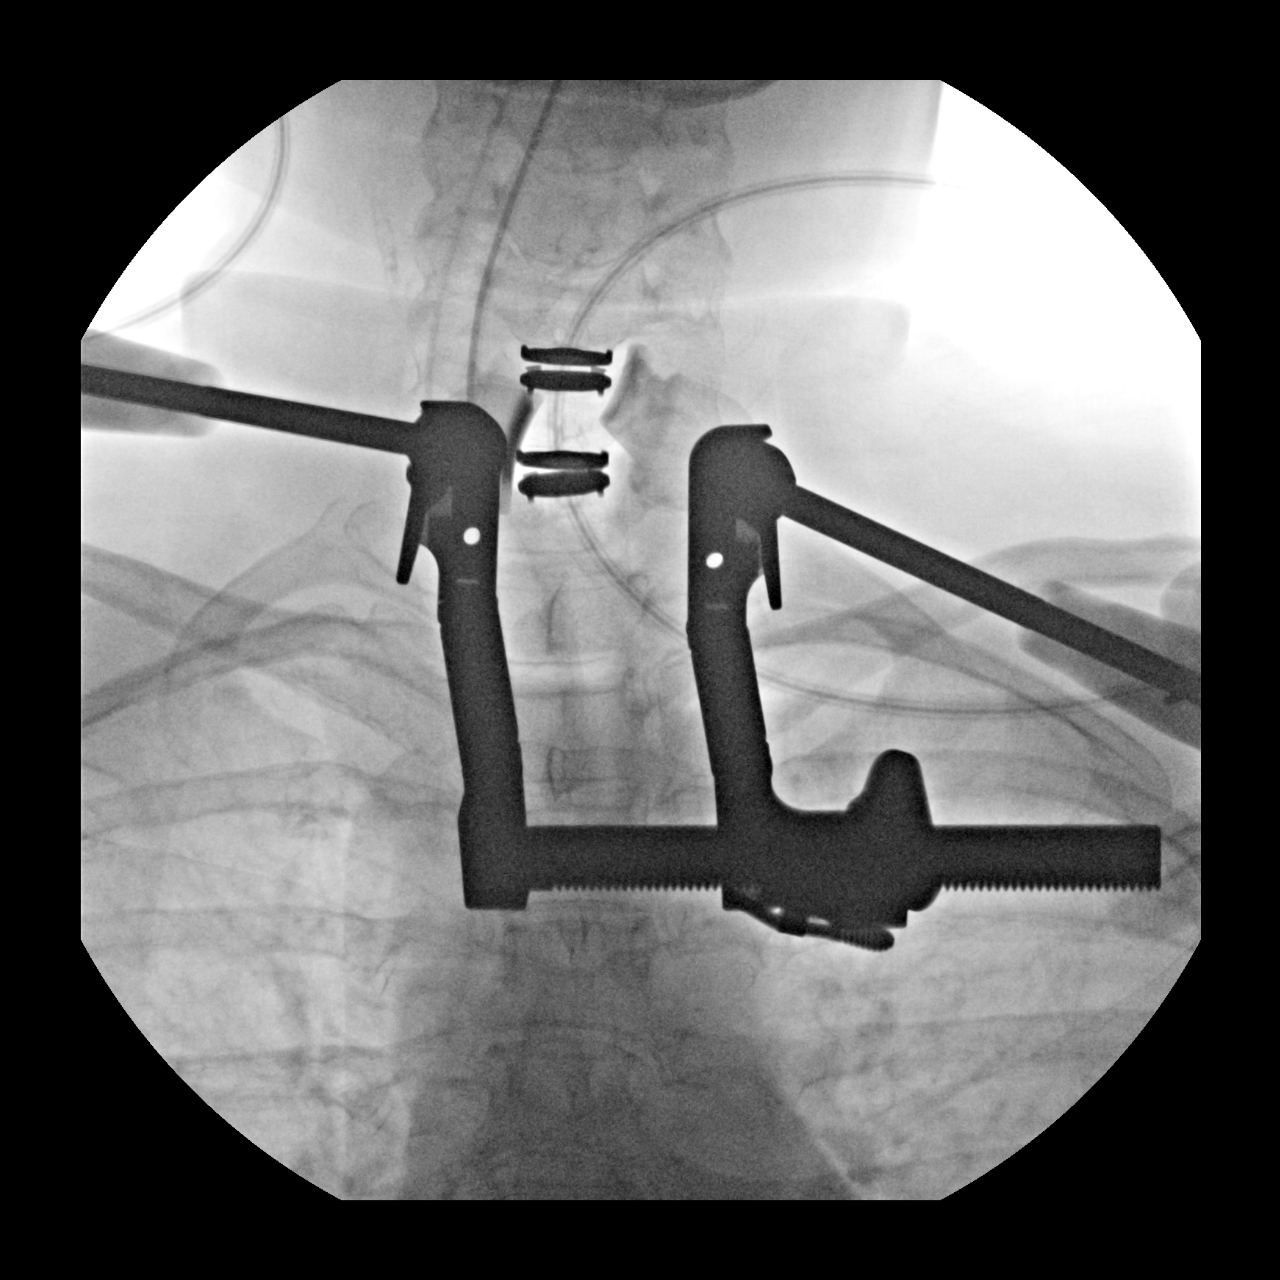

[2 of 2 positions shown; findings below may reference images not displayed]

FINDINGS: Two C-arm fluoroscopic images were obtained intraoperatively and
submitted for post operative interpretation. These images
demonstrate changes of C5-C6 and C6-C7 disc arthroplasty. Please see
the performing provider's procedural report for further detail.
IMPRESSION: Intraoperative fluoroscopy, as detailed above.

## 2021-10-07 IMAGING — RF DG CERVICAL SPINE 2 OR 3 VIEWS
1 series · 2 of 2 positions shown · non-contrast
Comparison: MRI cervical spine [DATE].

CLINICAL DATA: surgery

EXAM:
DG C-ARM 1-60 MIN; CERVICAL SPINE - 2-3 VIEW
FLUOROSCOPY TIME:  Fluoroscopy Time:  33 seconds
Number of Acquired Spot Images: 2

[Series 1: dg x-ray · 0.20mm/px · 2 of 2 slices shown]
[im 1/2]
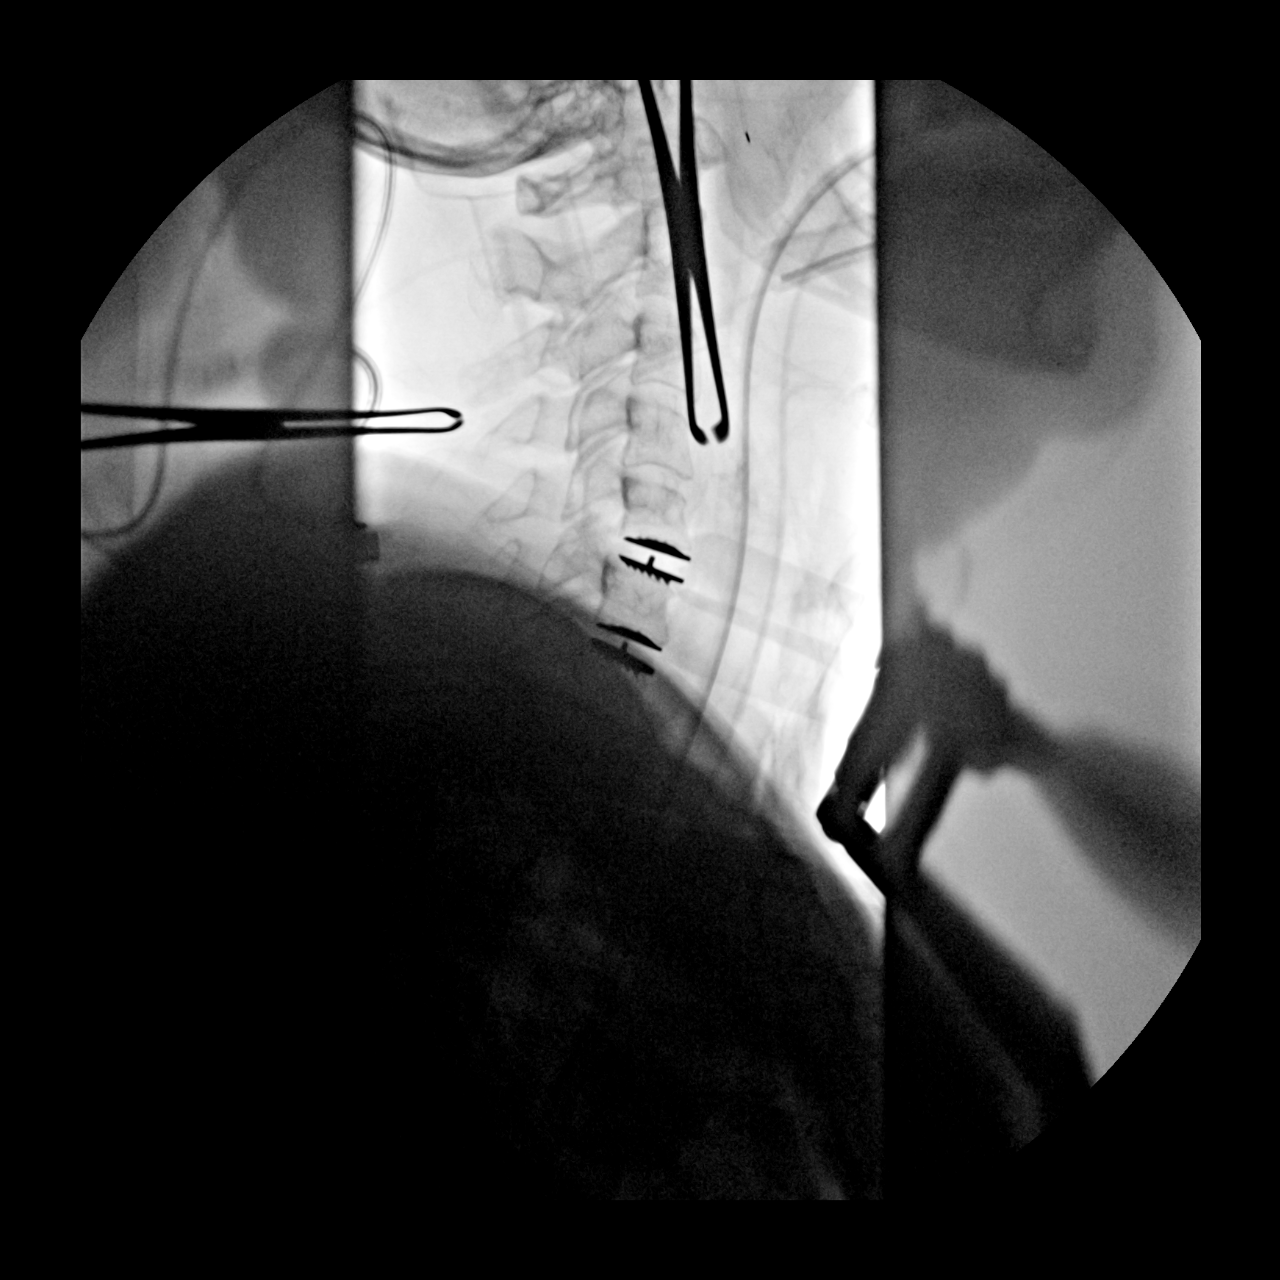
[im 2/2]
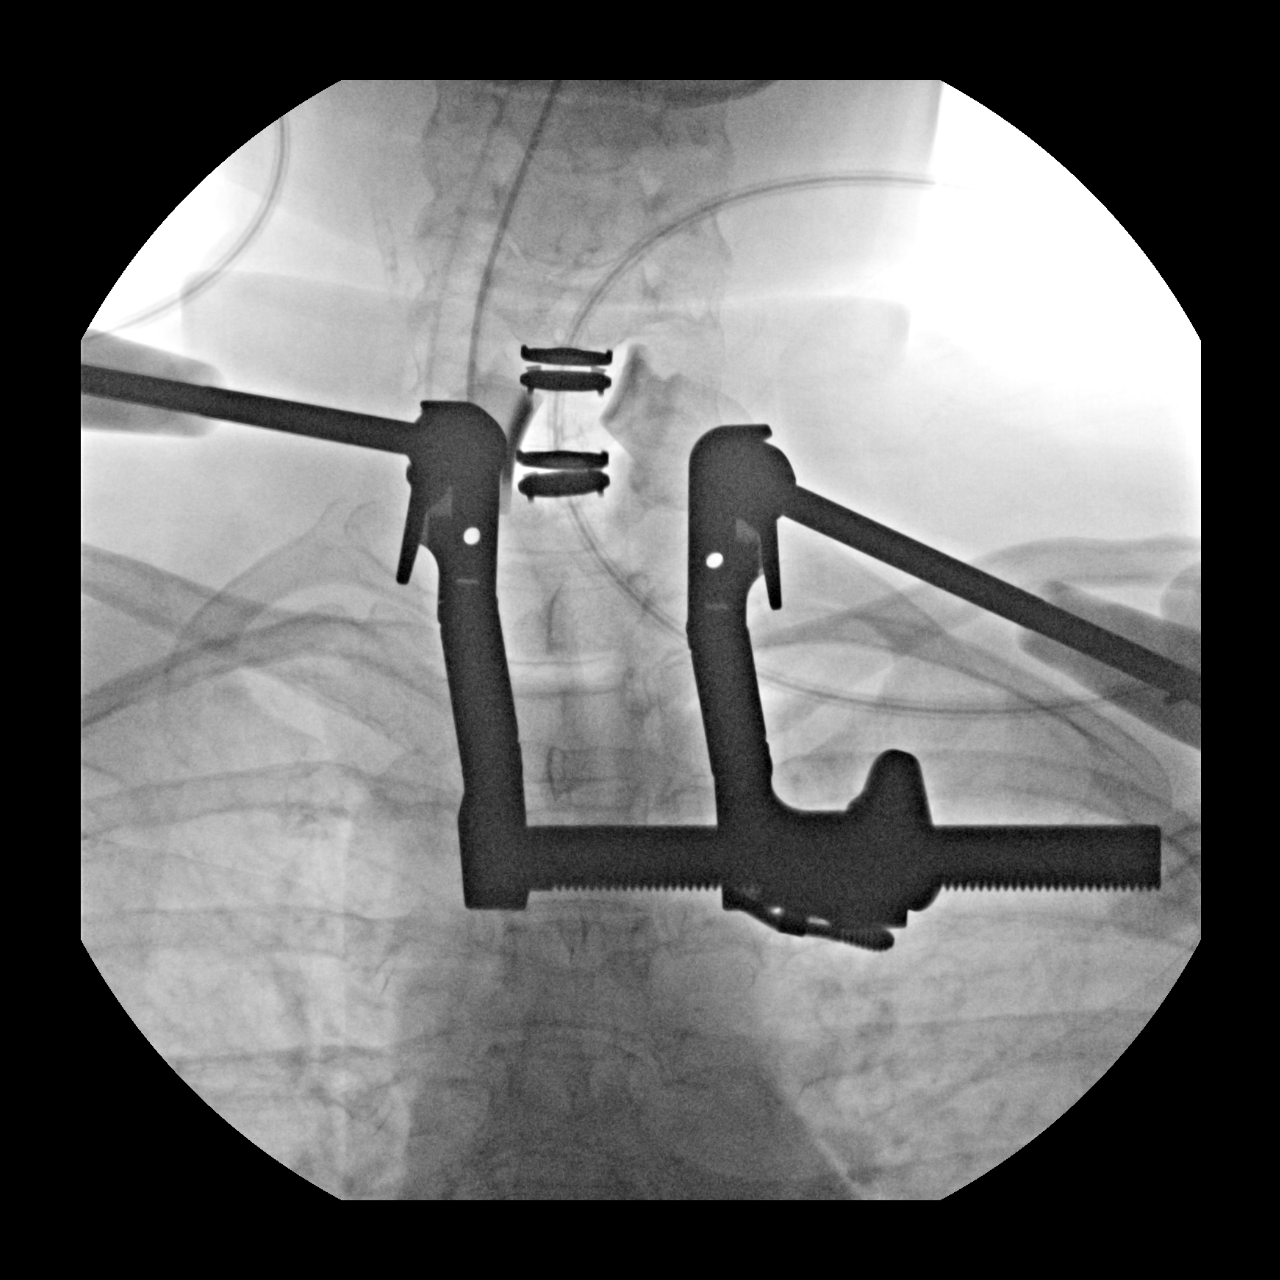

[2 of 2 positions shown; findings below may reference images not displayed]

FINDINGS: Two C-arm fluoroscopic images were obtained intraoperatively and
submitted for post operative interpretation. These images
demonstrate changes of C5-C6 and C6-C7 disc arthroplasty. Please see
the performing provider's procedural report for further detail.
IMPRESSION: Intraoperative fluoroscopy, as detailed above.

## 2021-10-07 IMAGING — RF DG C-ARM 1-60 MIN
1 series · 2 of 2 positions shown · non-contrast
Comparison: MRI cervical spine [DATE].

CLINICAL DATA: surgery

EXAM:
DG C-ARM 1-60 MIN; CERVICAL SPINE - 2-3 VIEW
FLUOROSCOPY TIME:  Fluoroscopy Time:  33 seconds
Number of Acquired Spot Images: 2

[Series 1: dg x-ray · 0.20mm/px · 2 of 2 slices shown]
[im 1/2]
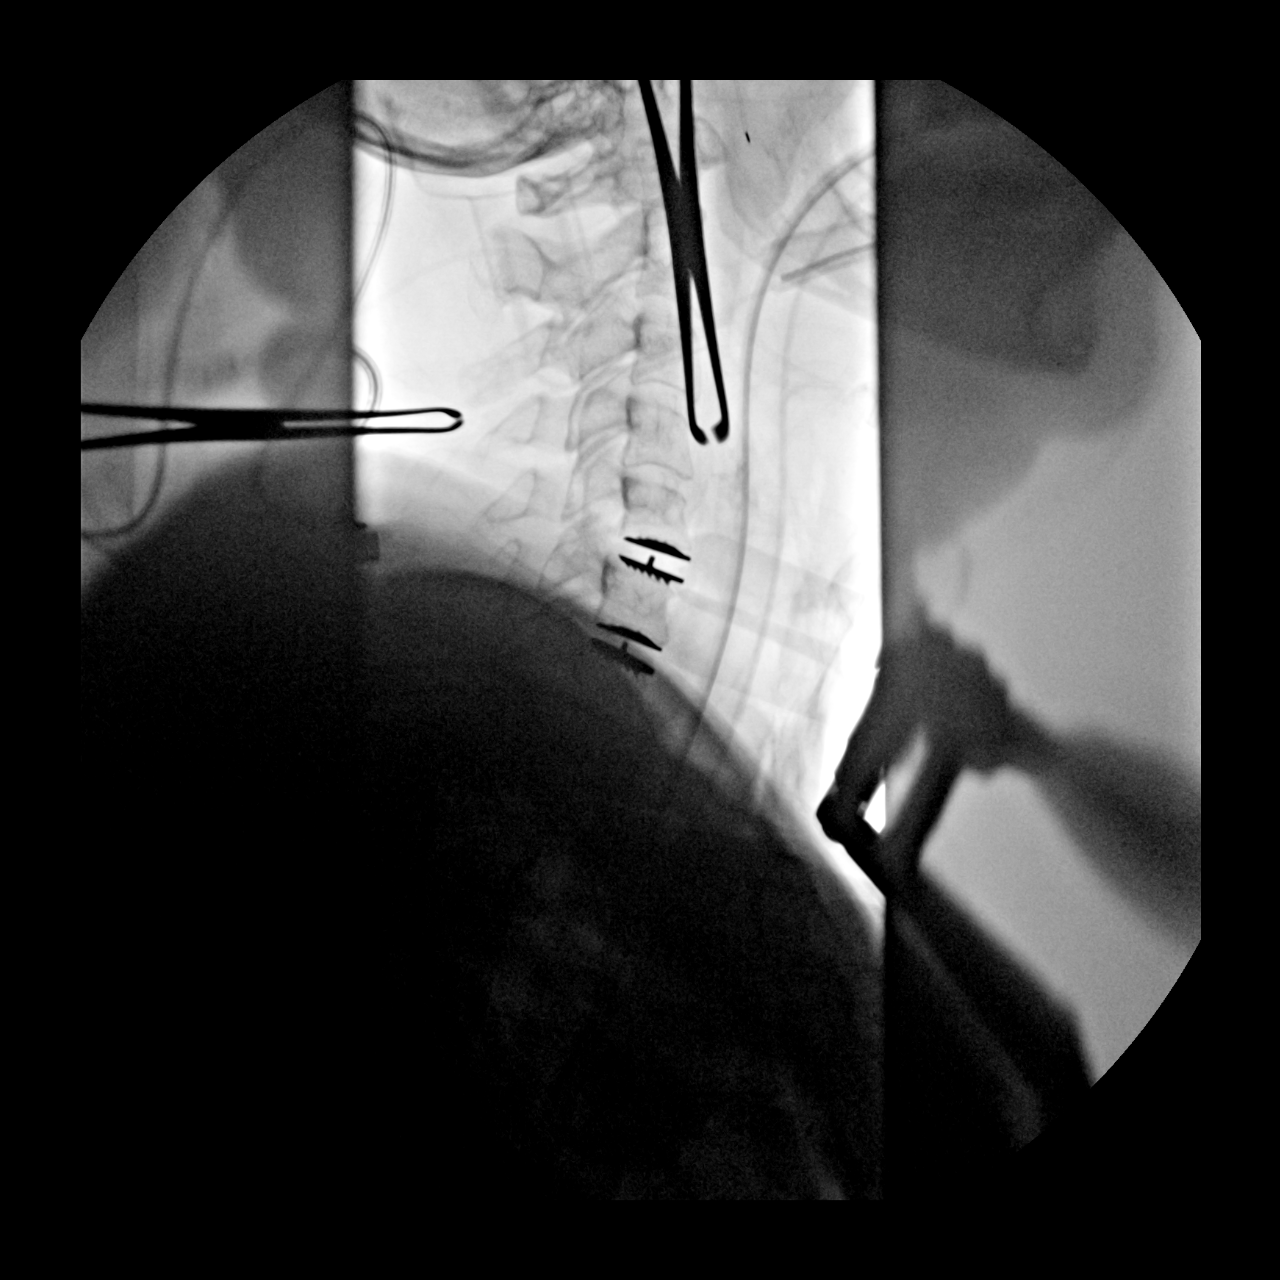
[im 2/2]
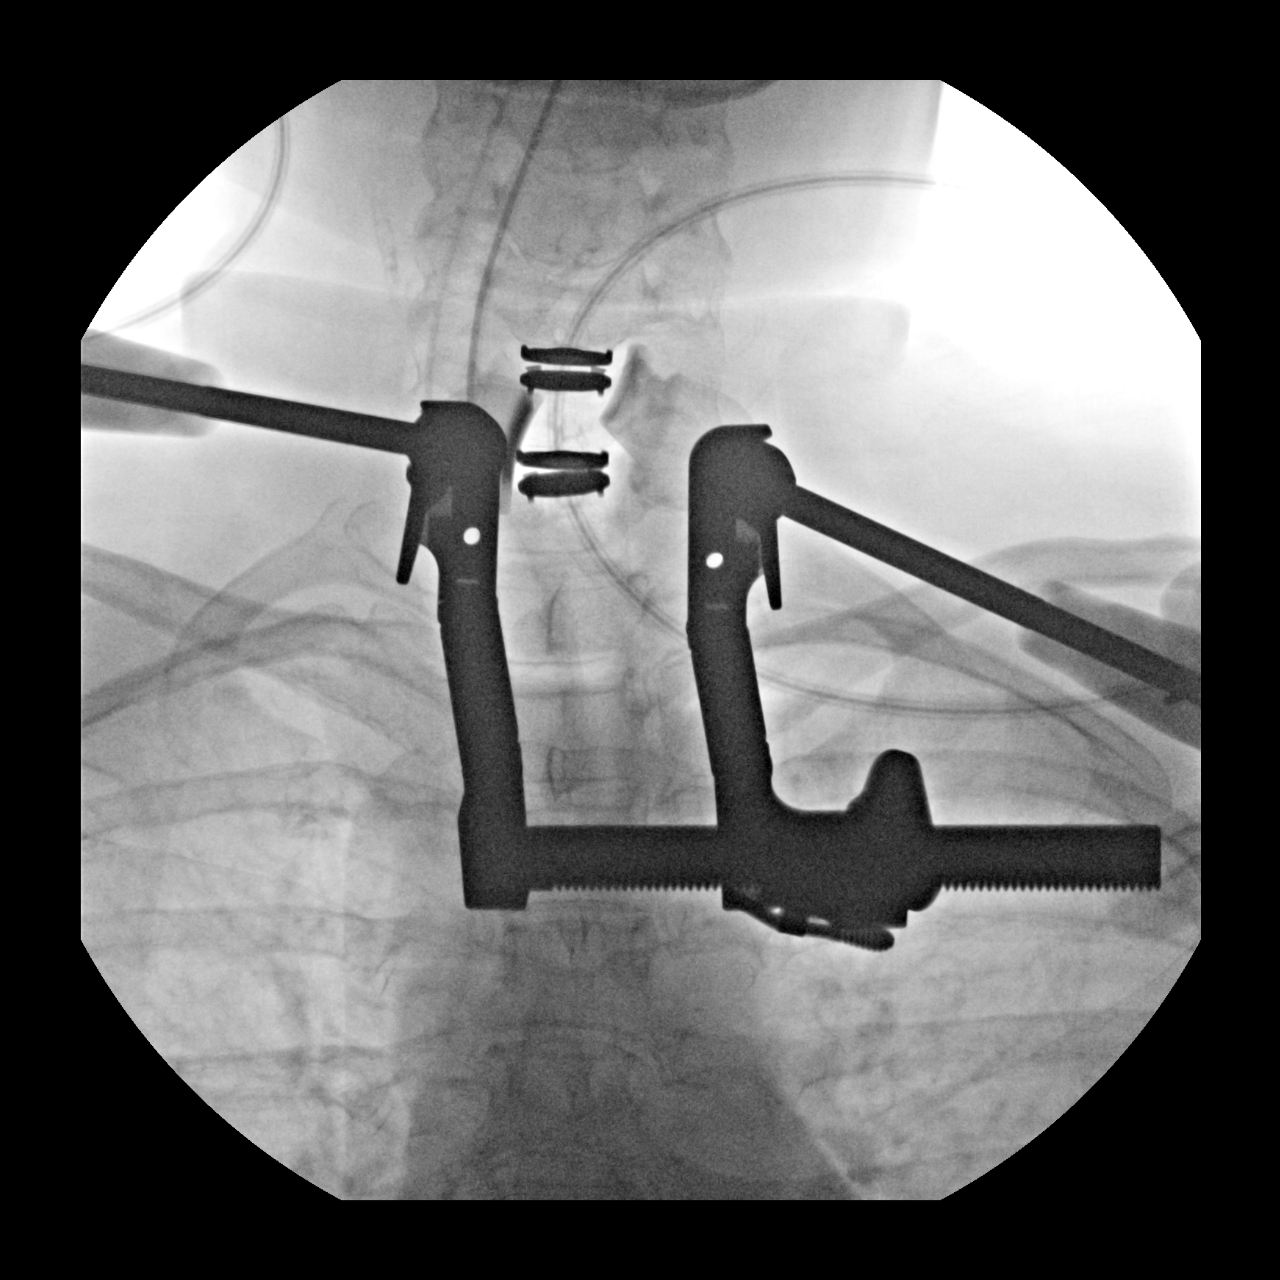

[2 of 2 positions shown; findings below may reference images not displayed]

FINDINGS: Two C-arm fluoroscopic images were obtained intraoperatively and
submitted for post operative interpretation. These images
demonstrate changes of C5-C6 and C6-C7 disc arthroplasty. Please see
the performing provider's procedural report for further detail.
IMPRESSION: Intraoperative fluoroscopy, as detailed above.

## 2021-10-07 SURGERY — CERVICAL ANTERIOR DISC ARTHROPLASTY
Anesthesia: General | Site: Neck

## 2021-10-07 MED ORDER — PROMETHAZINE HCL 25 MG/ML IJ SOLN
INTRAMUSCULAR | Status: AC
  Administered 2021-10-07: 6.25 mg via INTRAVENOUS
  Filled 2021-10-07: qty 1

## 2021-10-07 MED ORDER — PROPOFOL 10 MG/ML IV BOLUS
INTRAVENOUS | Status: DC | PRN
  Administered 2021-10-07: 200 mg via INTRAVENOUS
  Administered 2021-10-07: 80 mg via INTRAVENOUS

## 2021-10-07 MED ORDER — CHLORHEXIDINE GLUCONATE 0.12 % MT SOLN: 15.0000 mL | Freq: Once | OROMUCOSAL | Status: AC

## 2021-10-07 MED ORDER — MORPHINE SULFATE (PF) 2 MG/ML IV SOLN: 2.0000 mg | INTRAVENOUS | Status: AC | PRN

## 2021-10-07 MED ORDER — METHOCARBAMOL 500 MG PO TABS
500.0000 mg | ORAL_TABLET | Freq: Four times a day (QID) | ORAL | Status: DC
  Administered 2021-10-08 (×2): 500 mg via ORAL
  Filled 2021-10-07 (×2): qty 1

## 2021-10-07 MED ORDER — LIDOCAINE HCL (CARDIAC) PF 100 MG/5ML IV SOSY
PREFILLED_SYRINGE | INTRAVENOUS | Status: DC | PRN
  Administered 2021-10-07: 100 mg via INTRAVENOUS

## 2021-10-07 MED ORDER — MIDAZOLAM HCL 2 MG/2ML IJ SOLN
INTRAMUSCULAR | Status: DC | PRN
  Administered 2021-10-07: 2 mg via INTRAVENOUS

## 2021-10-07 MED ORDER — LINACLOTIDE 145 MCG PO CAPS
145.0000 ug | ORAL_CAPSULE | Freq: Every day | ORAL | Status: DC
  Administered 2021-10-07: 145 ug via ORAL
  Filled 2021-10-07: qty 1

## 2021-10-07 MED ORDER — FENTANYL CITRATE (PF) 100 MCG/2ML IJ SOLN
25.0000 ug | INTRAMUSCULAR | Status: DC | PRN
  Administered 2021-10-07 (×3): 25 ug via INTRAVENOUS

## 2021-10-07 MED ORDER — MENTHOL 3 MG MT LOZG
1.0000 | LOZENGE | OROMUCOSAL | Status: DC | PRN
  Filled 2021-10-07 (×2): qty 9

## 2021-10-07 MED ORDER — ACETAMINOPHEN 10 MG/ML IV SOLN
INTRAVENOUS | Status: DC | PRN
  Administered 2021-10-07: 1000 mg via INTRAVENOUS

## 2021-10-07 MED ORDER — CELECOXIB 200 MG PO CAPS
200.0000 mg | ORAL_CAPSULE | Freq: Two times a day (BID) | ORAL | Status: DC
  Administered 2021-10-07 – 2021-10-08 (×2): 200 mg via ORAL
  Filled 2021-10-07 (×2): qty 1

## 2021-10-07 MED ORDER — FENTANYL CITRATE (PF) 100 MCG/2ML IJ SOLN
INTRAMUSCULAR | Status: AC
  Filled 2021-10-07: qty 2

## 2021-10-07 MED ORDER — ACETAMINOPHEN 10 MG/ML IV SOLN
INTRAVENOUS | Status: AC
  Filled 2021-10-07: qty 100

## 2021-10-07 MED ORDER — ACETAMINOPHEN 650 MG RE SUPP
650.0000 mg | RECTAL | Status: DC | PRN
  Filled 2021-10-07: qty 1

## 2021-10-07 MED ORDER — CEFAZOLIN SODIUM-DEXTROSE 2-4 GM/100ML-% IV SOLN
2.0000 g | INTRAVENOUS | Status: AC
  Administered 2021-10-07: 2 g via INTRAVENOUS

## 2021-10-07 MED ORDER — SULFASALAZINE 500 MG PO TBEC
1000.0000 mg | DELAYED_RELEASE_TABLET | Freq: Two times a day (BID) | ORAL | Status: DC
  Administered 2021-10-07 – 2021-10-08 (×2): 1000 mg via ORAL
  Filled 2021-10-07 (×2): qty 2

## 2021-10-07 MED ORDER — NORTRIPTYLINE HCL 10 MG PO CAPS
20.0000 mg | ORAL_CAPSULE | Freq: Every day | ORAL | Status: DC
  Administered 2021-10-07: 20 mg via ORAL
  Filled 2021-10-07: qty 2

## 2021-10-07 MED ORDER — MIDAZOLAM HCL 2 MG/2ML IJ SOLN
INTRAMUSCULAR | Status: AC
  Filled 2021-10-07: qty 2

## 2021-10-07 MED ORDER — SUCCINYLCHOLINE CHLORIDE 200 MG/10ML IV SOSY
PREFILLED_SYRINGE | INTRAVENOUS | Status: DC | PRN
  Administered 2021-10-07: 100 mg via INTRAVENOUS

## 2021-10-07 MED ORDER — ORAL CARE MOUTH RINSE: 15.0000 mL | Freq: Once | OROMUCOSAL | Status: AC

## 2021-10-07 MED ORDER — OXYCODONE HCL 5 MG PO TABS
ORAL_TABLET | ORAL | Status: AC
  Filled 2021-10-07: qty 1

## 2021-10-07 MED ORDER — OXYCODONE HCL 5 MG PO TABS
5.0000 mg | ORAL_TABLET | ORAL | Status: AC
Start: 2021-10-07 — End: 2021-10-07
  Administered 2021-10-07: 5 mg via ORAL

## 2021-10-07 MED ORDER — FENTANYL CITRATE (PF) 100 MCG/2ML IJ SOLN
INTRAMUSCULAR | Status: AC
  Administered 2021-10-07: 25 ug via INTRAVENOUS
  Filled 2021-10-07: qty 2

## 2021-10-07 MED ORDER — PROPOFOL 10 MG/ML IV BOLUS
INTRAVENOUS | Status: AC
  Filled 2021-10-07: qty 20

## 2021-10-07 MED ORDER — CHLORHEXIDINE GLUCONATE 0.12 % MT SOLN
OROMUCOSAL | Status: AC
  Administered 2021-10-07: 15 mL via OROMUCOSAL
  Filled 2021-10-07: qty 15

## 2021-10-07 MED ORDER — HEMOSTATIC AGENTS (NO CHARGE) OPTIME
TOPICAL | Status: DC | PRN
  Administered 2021-10-07: 1 via TOPICAL

## 2021-10-07 MED ORDER — ROSUVASTATIN CALCIUM 20 MG PO TABS
40.0000 mg | ORAL_TABLET | Freq: Every evening | ORAL | Status: DC
  Filled 2021-10-07: qty 2

## 2021-10-07 MED ORDER — DEXMEDETOMIDINE (PRECEDEX) IN NS 20 MCG/5ML (4 MCG/ML) IV SYRINGE
PREFILLED_SYRINGE | INTRAVENOUS | Status: DC | PRN
  Administered 2021-10-07 (×2): 8 ug via INTRAVENOUS
  Administered 2021-10-07: 12 ug via INTRAVENOUS

## 2021-10-07 MED ORDER — POLYETHYLENE GLYCOL 3350 17 G PO PACK: 17.0000 g | PACK | Freq: Every day | ORAL | Status: DC | PRN

## 2021-10-07 MED ORDER — KETOROLAC TROMETHAMINE 30 MG/ML IJ SOLN
INTRAMUSCULAR | Status: AC
  Filled 2021-10-07: qty 1

## 2021-10-07 MED ORDER — SENNA 8.6 MG PO TABS
1.0000 | ORAL_TABLET | Freq: Two times a day (BID) | ORAL | Status: DC
  Administered 2021-10-07 – 2021-10-08 (×2): 8.6 mg via ORAL
  Filled 2021-10-07 (×2): qty 1

## 2021-10-07 MED ORDER — DULOXETINE HCL 60 MG PO CPEP
60.0000 mg | ORAL_CAPSULE | Freq: Every morning | ORAL | Status: DC
  Administered 2021-10-08: 60 mg via ORAL
  Filled 2021-10-07: qty 1

## 2021-10-07 MED ORDER — PREGABALIN 75 MG PO CAPS
75.0000 mg | ORAL_CAPSULE | Freq: Three times a day (TID) | ORAL | Status: DC
  Administered 2021-10-07 – 2021-10-08 (×2): 75 mg via ORAL
  Filled 2021-10-07 (×2): qty 1

## 2021-10-07 MED ORDER — BISACODYL 10 MG RE SUPP: 10.0000 mg | Freq: Every day | RECTAL | Status: DC | PRN

## 2021-10-07 MED ORDER — FOLIC ACID 1 MG PO TABS
1.0000 mg | ORAL_TABLET | Freq: Every day | ORAL | Status: DC
  Administered 2021-10-08: 1 mg via ORAL
  Filled 2021-10-07: qty 1

## 2021-10-07 MED ORDER — SUMATRIPTAN SUCCINATE 50 MG PO TABS
100.0000 mg | ORAL_TABLET | ORAL | Status: DC | PRN
  Filled 2021-10-07: qty 2

## 2021-10-07 MED ORDER — SODIUM CHLORIDE 0.9 % IV SOLN
INTRAVENOUS | Status: DC | PRN
  Administered 2021-10-07: .1 ug/kg/min via INTRAVENOUS

## 2021-10-07 MED ORDER — CHLORHEXIDINE GLUCONATE 0.12 % MT SOLN
15.0000 mL | Freq: Two times a day (BID) | OROMUCOSAL | Status: DC
  Administered 2021-10-07: 15 mL via OROMUCOSAL
  Filled 2021-10-07 (×2): qty 15

## 2021-10-07 MED ORDER — ONDANSETRON HCL 4 MG PO TABS
4.0000 mg | ORAL_TABLET | Freq: Four times a day (QID) | ORAL | Status: DC | PRN
Start: 2021-10-07 — End: 2021-10-08

## 2021-10-07 MED ORDER — FENTANYL CITRATE (PF) 100 MCG/2ML IJ SOLN
INTRAMUSCULAR | Status: DC | PRN
  Administered 2021-10-07: 100 ug via INTRAVENOUS
  Administered 2021-10-07: 50 ug via INTRAVENOUS
  Administered 2021-10-07: 100 ug via INTRAVENOUS

## 2021-10-07 MED ORDER — FAMOTIDINE 20 MG PO TABS
ORAL_TABLET | ORAL | Status: AC
  Administered 2021-10-07: 20 mg via ORAL
  Filled 2021-10-07: qty 1

## 2021-10-07 MED ORDER — PROMETHAZINE HCL 25 MG/ML IJ SOLN: 6.2500 mg | INTRAMUSCULAR | Status: DC | PRN

## 2021-10-07 MED ORDER — SODIUM CHLORIDE 0.9% FLUSH: 3.0000 mL | INTRAVENOUS | Status: DC | PRN

## 2021-10-07 MED ORDER — PROPOFOL 10 MG/ML IV BOLUS
INTRAVENOUS | Status: AC
  Filled 2021-10-07: qty 40

## 2021-10-07 MED ORDER — SODIUM CHLORIDE 0.9% FLUSH
3.0000 mL | Freq: Two times a day (BID) | INTRAVENOUS | Status: DC
  Administered 2021-10-07: 3 mL via INTRAVENOUS

## 2021-10-07 MED ORDER — SODIUM CHLORIDE FLUSH 0.9 % IV SOLN
INTRAVENOUS | Status: AC
  Filled 2021-10-07: qty 10

## 2021-10-07 MED ORDER — FAMOTIDINE 20 MG PO TABS: 20.0000 mg | ORAL_TABLET | Freq: Once | ORAL | Status: AC

## 2021-10-07 MED ORDER — HYDROCODONE-ACETAMINOPHEN 5-325 MG PO TABS
2.0000 | ORAL_TABLET | ORAL | Status: DC | PRN
  Administered 2021-10-07 – 2021-10-08 (×3): 2 via ORAL
  Filled 2021-10-07 (×3): qty 2

## 2021-10-07 MED ORDER — REMIFENTANIL HCL 1 MG IV SOLR
INTRAVENOUS | Status: AC
  Filled 2021-10-07: qty 1000

## 2021-10-07 MED ORDER — TACROLIMUS 0.1 % EX OINT: 1.0000 "application " | TOPICAL_OINTMENT | Freq: Two times a day (BID) | CUTANEOUS | Status: DC

## 2021-10-07 MED ORDER — ONDANSETRON HCL 4 MG/2ML IJ SOLN
INTRAMUSCULAR | Status: DC | PRN
  Administered 2021-10-07: 4 mg via INTRAVENOUS

## 2021-10-07 MED ORDER — DEXAMETHASONE SODIUM PHOSPHATE 10 MG/ML IJ SOLN
INTRAMUSCULAR | Status: DC | PRN
  Administered 2021-10-07: 8 mg via INTRAVENOUS

## 2021-10-07 MED ORDER — ARIPIPRAZOLE 10 MG PO TABS
10.0000 mg | ORAL_TABLET | Freq: Every day | ORAL | Status: DC
  Administered 2021-10-07: 10 mg via ORAL
  Filled 2021-10-07: qty 1

## 2021-10-07 MED ORDER — PHENYLEPHRINE HCL-NACL 20-0.9 MG/250ML-% IV SOLN
INTRAVENOUS | Status: DC | PRN
  Administered 2021-10-07: 20 ug/min via INTRAVENOUS

## 2021-10-07 MED ORDER — LACTATED RINGERS IV SOLN: INTRAVENOUS | Status: DC

## 2021-10-07 MED ORDER — 0.9 % SODIUM CHLORIDE (POUR BTL) OPTIME
TOPICAL | Status: DC | PRN
  Administered 2021-10-07: 1000 mL

## 2021-10-07 MED ORDER — SODIUM CHLORIDE 0.9 % IV SOLN: INTRAVENOUS | Status: DC

## 2021-10-07 MED ORDER — METHOCARBAMOL 1000 MG/10ML IJ SOLN
500.0000 mg | Freq: Four times a day (QID) | INTRAVENOUS | Status: DC
  Filled 2021-10-07: qty 5

## 2021-10-07 MED ORDER — SODIUM CHLORIDE 0.9 % IV SOLN
250.0000 mL | INTRAVENOUS | Status: DC
  Administered 2021-10-07: 250 mL via INTRAVENOUS

## 2021-10-07 MED ORDER — HYDROCODONE-ACETAMINOPHEN 5-325 MG PO TABS: 1.0000 | ORAL_TABLET | ORAL | Status: DC | PRN

## 2021-10-07 MED ORDER — PROPOFOL 500 MG/50ML IV EMUL
INTRAVENOUS | Status: DC | PRN
  Administered 2021-10-07: 50 ug/kg/min via INTRAVENOUS

## 2021-10-07 MED ORDER — FLUTICASONE PROPIONATE 50 MCG/ACT NA SUSP
1.0000 | Freq: Two times a day (BID) | NASAL | Status: DC | PRN
  Filled 2021-10-07: qty 16

## 2021-10-07 MED ORDER — ACETAMINOPHEN 325 MG PO TABS: 650.0000 mg | ORAL_TABLET | ORAL | Status: DC | PRN

## 2021-10-07 MED ORDER — FLEET ENEMA 7-19 GM/118ML RE ENEM: 1.0000 | ENEMA | Freq: Once | RECTAL | Status: DC | PRN

## 2021-10-07 MED ORDER — LIDOCAINE-EPINEPHRINE 1 %-1:100000 IJ SOLN
INTRAMUSCULAR | Status: DC | PRN
  Administered 2021-10-07: 7 mL

## 2021-10-07 MED ORDER — CEFAZOLIN SODIUM-DEXTROSE 2-4 GM/100ML-% IV SOLN
INTRAVENOUS | Status: AC
  Filled 2021-10-07: qty 100

## 2021-10-07 MED ORDER — ONDANSETRON HCL 4 MG/2ML IJ SOLN: 4.0000 mg | Freq: Four times a day (QID) | INTRAMUSCULAR | Status: DC | PRN

## 2021-10-07 MED ORDER — PHENOL 1.4 % MT LIQD
1.0000 | OROMUCOSAL | Status: DC | PRN
  Filled 2021-10-07 (×2): qty 177

## 2021-10-07 MED ORDER — LACTATED RINGERS IV SOLN: INTRAVENOUS | Status: DC | PRN

## 2021-10-07 MED ORDER — RUXOLITINIB PHOSPHATE 1.5 % EX CREA: 1.0000 "application " | TOPICAL_CREAM | Freq: Every day | CUTANEOUS | Status: DC

## 2021-10-07 SURGICAL SUPPLY — 52 items
BLADE BOVIE TIP EXT 4 (BLADE) IMPLANT
BUR DIAMOND COARSE 4.0 RND (BURR) IMPLANT
BUR NEURO DRILL SOFT 3.0X3.8M (BURR) ×2 IMPLANT
CHLORAPREP W/TINT 26 (MISCELLANEOUS) ×2 IMPLANT
COUNTER NEEDLE 20/40 LG (NEEDLE) ×2 IMPLANT
COVER LIGHT HANDLE STERIS (MISCELLANEOUS) ×4 IMPLANT
DERMABOND ADVANCED (GAUZE/BANDAGES/DRESSINGS) ×1
DERMABOND ADVANCED .7 DNX12 (GAUZE/BANDAGES/DRESSINGS) ×1 IMPLANT
DISC MOBI-C CERVICAL 13X15 H5 (Miscellaneous) ×4 IMPLANT
DRAPE C-ARM 42X72 X-RAY (DRAPES) ×2 IMPLANT
DRAPE C-ARMOR (DRAPES) ×2 IMPLANT
DRAPE SURG 17X11 SM STRL (DRAPES) ×4 IMPLANT
DRAPE THYROID T SHEET (DRAPES) ×2 IMPLANT
ELECT CAUTERY BLADE TIP 2.5 (TIP) ×2
ELECTRODE CAUTERY BLDE TIP 2.5 (TIP) ×1 IMPLANT
FEE INTRAOP CADWELL SUPPLY NCS (MISCELLANEOUS) ×1 IMPLANT
FEE INTRAOP MONITOR IMPULS NCS (MISCELLANEOUS) IMPLANT
GAUZE 4X4 16PLY ~~LOC~~+RFID DBL (SPONGE) ×2 IMPLANT
GLOVE SRG 8 PF TXTR STRL LF DI (GLOVE) ×1 IMPLANT
GLOVE SURG SYN 6.5 ES PF (GLOVE) ×4 IMPLANT
GLOVE SURG SYN 8.0 (GLOVE) ×4 IMPLANT
GLOVE SURG UNDER POLY LF SZ6.5 (GLOVE) ×4 IMPLANT
GLOVE SURG UNDER POLY LF SZ8 (GLOVE) ×1
GOWN SRG LRG LVL 4 IMPRV REINF (GOWNS) ×2 IMPLANT
GOWN STRL REIN LRG LVL4 (GOWNS) ×2
GOWN STRL REUS W/ TWL XL LVL3 (GOWN DISPOSABLE) ×2 IMPLANT
GOWN STRL REUS W/TWL XL LVL3 (GOWN DISPOSABLE) ×2
GRADUATE 1200CC STRL 31836 (MISCELLANEOUS) ×2 IMPLANT
INTRAOP CADWELL SUPPLY FEE NCS (MISCELLANEOUS) ×1
INTRAOP DISP SUPPLY FEE NCS (MISCELLANEOUS) ×1
INTRAOP MONITOR FEE IMPULS NCS (MISCELLANEOUS)
INTRAOP MONITOR FEE IMPULSE (MISCELLANEOUS)
KIT TURNOVER KIT A (KITS) ×2 IMPLANT
MANIFOLD NEPTUNE II (INSTRUMENTS) ×2 IMPLANT
MARKER SKIN DUAL TIP RULER LAB (MISCELLANEOUS) ×4 IMPLANT
NEEDLE HYPO 22GX1.5 SAFETY (NEEDLE) ×2 IMPLANT
NEEDLE SPNL 22GX3.5 QUINCKE BK (NEEDLE) ×2 IMPLANT
NS IRRIG 1000ML POUR BTL (IV SOLUTION) ×2 IMPLANT
PACK LAMINECTOMY NEURO (CUSTOM PROCEDURE TRAY) ×2 IMPLANT
PAD ARMBOARD 7.5X6 YLW CONV (MISCELLANEOUS) ×2 IMPLANT
PIN CASPAR 14 (PIN) ×1 IMPLANT
PIN CASPAR 14MM (PIN) ×2
SPONGE KITTNER 5P (MISCELLANEOUS) ×4 IMPLANT
SURGIFLO W/THROMBIN 8M KIT (HEMOSTASIS) ×4 IMPLANT
SUT VIC AB 2-0 CT1 18 (SUTURE) ×2 IMPLANT
SUT VICRYL 3-0 CR8 SH (SUTURE) ×2 IMPLANT
SYR 30ML LL (SYRINGE) ×2 IMPLANT
TAPE CLOTH 3X10 WHT NS LF (GAUZE/BANDAGES/DRESSINGS) ×2 IMPLANT
TOWEL OR 17X26 4PK STRL BLUE (TOWEL DISPOSABLE) ×6 IMPLANT
TRAY FOLEY MTR SLVR 16FR STAT (SET/KITS/TRAYS/PACK) IMPLANT
TUBING CONNECTING 10 (TUBING) ×2 IMPLANT
WATER STERILE IRR 500ML POUR (IV SOLUTION) IMPLANT

## 2021-10-07 NOTE — H&P (Signed)
Sandra Doyle is an 45 y.o. female.   Chief Complaint: Left arm pain and numbness HPI: Sandra Doyle is here for evaluation of ongoing left arm pain and numbness that she says started about 1-2 months ago. She does not remember an inciting event but she does state the pain is severe. She feels the pain and numbness will go down the posterior side of the left arm towards the lateral side of the hand including the first 3 digits. She will also get some pain in the left chest wall and underneath the armpit. She does not endorse any right-sided symptoms. She has been to the pain clinic with Dr. Cherylann Ratel and is on medication and has had a recent ESI. She states the [facility] helped for the day of the procedure but not afterwards. She has not had any physical therapy. She does have an MRI of the cervical spine and is here for review of treatment options.  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   Chronic pain syndrome    Fibromyalgia    Hypercholesteremia    Hypertension    Left lumbar radiculopathy    Migraines    Mitral valve prolapse    Orthostatic syncope    Palpitations    Psoriatic arthritis (HCC)    Sacral back pain    SVT (supraventricular tachycardia) (HCC)    Synovitis and tenosynovitis     Past Surgical History:  Procedure Laterality Date   ABDOMINAL HYSTERECTOMY     CARDIAC ELECTROPHYSIOLOGY MAPPING AND ABLATION     CARDIAC SURGERY     ablasion   WRIST SURGERY      Family History  Problem Relation Age of Onset   Hypertension Mother    Stroke Mother    Osteoarthritis Mother    Anuerysm Father    Social History:  reports that she quit smoking about 5 years ago. Her smoking use included cigarettes. She smoked an average of .5 packs per day. She has never used smokeless tobacco. She reports current alcohol use. She reports that she does not use drugs.  Allergies:   Allergies  Allergen Reactions   Seasonal Ic [Cholestatin] Anaphylaxis, Hives and Shortness Of Breath    Patient has an epi pen for "everything" including seasonal allergies. She said she is very reactive to everyday allergens outside, cats, dogs and cockroaches     Medications Prior to Admission  Medication Sig Dispense Refill   acetaminophen (TYLENOL) 650 MG CR tablet Take 650 mg by mouth every 8 (eight) hours as needed for pain.     ARIPiprazole (ABILIFY) 10 MG tablet Take 10 mg by mouth at bedtime.     cetirizine (ZYRTEC) 10 MG tablet Take 10 mg by mouth daily.     chlorhexidine (PERIDEX) 0.12 % solution Use as directed 15 mLs in the mouth or throat 2 (two) times daily.     clobetasol ointment (TEMOVATE) 0.05 % Apply 1 application topically 2 (two) times daily.     DULoxetine (CYMBALTA) 60 MG capsule Take 1 capsule (60 mg total) by mouth daily. (Patient taking differently: Take 60 mg by mouth in the morning.) 30 capsule 3   fluticasone (FLONASE) 50 MCG/ACT nasal spray Place 1 spray into both nostrils 2 (two) times daily as needed for allergies or rhinitis.     folic acid (FOLVITE) 1 MG  tablet Take 1 mg by mouth daily.     linaclotide (LINZESS) 145 MCG CAPS capsule Take 145 mcg by mouth at bedtime.     nortriptyline (PAMELOR) 10 MG capsule Take 2 capsules (20 mg total) by mouth at bedtime. 60 capsule 3   OPZELURA 1.5 % CREA Apply 1 application topically at bedtime.     pregabalin (LYRICA) 75 MG capsule Take 1 capsule (75 mg total) by mouth 3 (three) times daily. 90 capsule 3   rosuvastatin (CRESTOR) 40 MG tablet Take 40 mg by mouth every evening.     Sod Fluoride-Potassium Nitrate (SODIUM FLUORIDE 5000 SENSITIVE) 1.1-5 % GEL Place 1 application onto teeth daily.     sulfaSALAzine (AZULFIDINE) 500 MG EC tablet Take 1,000 mg by mouth in the morning and at bedtime.     SUMAtriptan (IMITREX) 100 MG tablet Take 100 mg by mouth every 2 (two) hours as needed for migraine.     tacrolimus  (PROTOPIC) 0.1 % ointment Apply 1 application topically 2 (two) times daily.     triamcinolone cream (KENALOG) 0.5 % Apply 1 application topically 2 (two) times daily.     EPINEPHrine 0.3 mg/0.3 mL IJ SOAJ injection Inject 0.3 mg into the muscle as needed for anaphylaxis. (Patient not taking: Reported on 10/07/2021)      No results found for this or any previous visit (from the past 48 hour(s)). No results found.  Review of Systems General ROS: Negative Psychological ROS: Negative Ophthalmic ROS: Negative ENT ROS: Negative Hematological and Lymphatic ROS: Negative  Endocrine ROS: Negative Respiratory ROS: Negative Cardiovascular ROS: Negative Gastrointestinal ROS: Negative Genito-Urinary ROS: Negative Musculoskeletal ROS: Negative Neurological ROS: Positive for numbness, pain Dermatological ROS: Negative Blood pressure (!) 132/96, pulse (!) 106, temperature (!) 97.1 F (36.2 C), temperature source Tympanic, resp. rate 18, height 5\' 4"  (1.626 m), weight 72.6 kg, SpO2 98 %. Physical Exam  General appearance: Alert, cooperative, in no acute distress Head: Normocephalic, atraumatic Eyes: Normal, EOM intact Oropharynx: Wearing facemask Neck: Supple, range of motion appears full Ext: No edema in LE bilaterally, warm extremities  Neurologic exam:  Mental status: alertness: alert, affect: normal Speech: fluent and clear Motor:strength symmetric 5/5 in bilateral upper extremities including deltoid, bicep, tricep, grip but there is some pain limitation and pain with testing, especially of the left tricep Sensory: Decrease sensation noted in the second and third digit of the left hand, posterior left arm Gait: normal  Imaging: MRI cervical spine: There is straightening of the lordotic curvature. There is some mild degenerative disc disease noted. Alignment is well-maintained. At C5-6 there is a left disc protrusion causing foraminal stenosis and at C6-7, there is additional disc  protrusion causing left foraminal stenosis. There is no obvious central stenosis. Assessment/Plan Proceed with C5-7 Arthroplasty  , MD 10/07/2021, 1:45 PM

## 2021-10-07 NOTE — Transfer of Care (Signed)
Immediate Anesthesia Transfer of Care Note  Patient: Sandra Doyle  Procedure(s) Performed: C5/6, C6/7 ARTHROPLASTY (Neck)  Patient Location: PACU  Anesthesia Type:General  Level of Consciousness: drowsy and patient cooperative  Airway & Oxygen Therapy: Patient connected to face mask oxygen  Post-op Assessment: Report given to RN and Post -op Vital signs reviewed and stable  Post vital signs: stable  Last Vitals:  Vitals Value Taken Time  BP 153/103 10/07/21 1716  Temp    Pulse 105 10/07/21 1717  Resp 27 10/07/21 1718  SpO2 100 % 10/07/21 1717  Vitals shown include unvalidated device data.  Last Pain:  Vitals:   10/07/21 1216  TempSrc: Tympanic  PainSc: 0-No pain      Patients Stated Pain Goal: 0 (10/07/21 1216)  Complications: No notable events documented.

## 2021-10-07 NOTE — Progress Notes (Signed)
PHARMACY -  BRIEF ANTIBIOTIC NOTE   Pharmacy has received consult(s) for Cefazolin from an OR provider.  The patient's profile has been reviewed for ht/wt/allergies/indication/available labs.    One time order(s) placed for Cefazolin 2 gm preop  Further antibiotics/pharmacy consults should be ordered by admitting physician if indicated.                       Thank you, Otelia Sergeant, PharmD, [facility] 10/07/2021 2:09 AM

## 2021-10-07 NOTE — Discharge Instructions (Signed)
Your surgeon has performed an operation on your cervical spine (neck) to relieve pressure on the spinal cord and/or nerves. This involved making an incision in the front of your neck and removing one or more of the discs that support your spine. Next, a small piece of bone, a titanium plate, and screws were used to fuse two or more of the vertebrae (bones) together.  The following are instructions to help in your recovery once you have been discharged from the hospital. Even if you feel well, it is important that you follow these activity guidelines. If you do not let your neck heal properly from the surgery, you can increase the chance of return of your symptoms and other complications.  * Do not take anti-inflammatory medications for 3 months after surgery (naproxen [Aleve], ibuprofen [Advil, Motrin], etc.). These medications can prevent your bones from healing properly.  Activity    No bending, lifting, or twisting ("BLT"). Avoid lifting objects heavier than 10 pounds (gallon milk jug).  Where possible, avoid household activities that involve lifting, bending, reaching, pushing, or pulling such as laundry, vacuuming, grocery shopping, and childcare. Try to arrange for help from friends and family for these activities while your back heals.  Increase physical activity slowly as tolerated.  Taking short walks is encouraged, but avoid strenuous exercise. Do not jog, run, bicycle, lift weights, or participate in any other exercises unless specifically allowed by your doctor.  Talk to your doctor before resuming sexual activity.  You should not drive until cleared by your doctor.  Until released by your doctor, you should not return to work or school.  You should rest at home and let your body heal.   You may shower three days after your surgery.  After showering, lightly dab your incision dry. Do not take a tub bath or go swimming until approved by your doctor at your follow-up appointment.  If  your doctor ordered a cervical collar (neck brace) for you, you should wear it whenever you are out of bed. You may remove it when lying down or sleeping, but you should wear it at all other times. Not all neck surgeries require a cervical collar.  If you smoke, we strongly recommend that you quit.  Smoking has been proven to interfere with normal bone healing and will dramatically reduce the success rate of your surgery. Please contact [facility] (800-QUIT-NOW) and use the resources at www.[facility].com for assistance in stopping smoking.  Surgical Incision   If you have a dressing on your incision, you may remove it two days after your surgery. Keep your incision area clean and dry. Your incision was closed with Dermabond glue. The glue should begin to peel away within about a week. Diet           You may return to your usual diet. However, you may experience discomfort when swallowing in the first month after your surgery. This is normal. You may find that softer foods are more comfortable for you to swallow. Be sure to stay hydrated.  When to Contact Us  You may experience pain in your neck and/or pain between your shoulder blades. This is normal and should improve in the next few weeks with the help of pain medication, muscle relaxers, and rest. Some patients report that a warm compress on the back of the neck or between the shoulder blades helps.  However, should you experience any of the following, contact us immediately: New numbness or weakness Pain that is progressively getting   worse, and is not relieved by your pain medication, muscle relaxers, rest, and warm compresses Bleeding, redness, swelling, pain, or drainage from surgical incision Chills or flu-like symptoms Fever greater than 101.0 F (38.3 C) Inability to eat, drink fluids, or take medications Problems with bowel or bladder functions Difficulty breathing or shortness of breath Warmth, tenderness, or swelling in your  calf Contact Information During office hours (Monday-Friday 9 am to 5 pm), please call your physician at [phone] and ask for Kendelyn Jean After hours and weekends, please call [phone] and speak with the answering service, who will contact the doctor on call.  If that fails, call the [facility] Operator at [phone] and ask for the Neurosurgery Resident On Call  For a life-threatening emergency, call 911   

## 2021-10-07 NOTE — Anesthesia Postprocedure Evaluation (Signed)
Anesthesia Post Note  Patient: Sandra Doyle  Procedure(s) Performed: C5/6, C6/7 ARTHROPLASTY (Neck)  Patient location during evaluation: PACU Anesthesia Type: General Level of consciousness: awake and alert Pain management: pain level controlled Vital Signs Assessment: post-procedure vital signs reviewed and stable Respiratory status: spontaneous breathing, nonlabored ventilation, respiratory function stable and patient connected to nasal cannula oxygen Cardiovascular status: blood pressure returned to baseline and stable Postop Assessment: no apparent nausea or vomiting Anesthetic complications: no   No notable events documented.   Last Vitals:  Vitals:   10/07/21 1745 10/07/21 1800  BP: (!) 155/104 (!) 168/110  Pulse: (!) 101 (!) 103  Resp: (!) 8 12  Temp:  (!) 36.2 C  SpO2: 92% 96%    Last Pain:  Vitals:   10/07/21 1820  TempSrc:   PainSc: 2                  Corinda Gubler

## 2021-10-07 NOTE — Interval H&P Note (Signed)
History and Physical Interval Note:  10/07/2021 1:46 PM  Sandra Doyle  has presented today for surgery, with the diagnosis of cervical radiculopathy m54.12.  The various methods of treatment have been discussed with the patient and family. After consideration of risks, benefits and other options for treatment, the patient has consented to  Procedure(s): C5/6, C6/7 ARTHROPLASTY (N/A) as a surgical intervention.  The patient's history has been reviewed, patient examined, no change in status, stable for surgery.  I have reviewed the patient's chart and labs.  Questions were answered to the patient's satisfaction.     Lucy Chris

## 2021-10-07 NOTE — Op Note (Signed)
Operative Note   SURGERY DATE:  10/07/2021   PRE-OP DIAGNOSIS:  Cervical radiculopathy [M54.12]    POST-OP DIAGNOSIS: Post-Op Diagnosis Codes:  Cervical radiculopathy [M54.12]    Procedure(s): C5-6, C6-7 ARTHROPLASTY MICROSURGICAL TECHNIQUES, REQUIRING USE OF OPERATING MICROSCOPE   SURGEON:     * Nathaniel Man, MD        Trena Platt PA Assistant  ANESTHESIA: General    OPERATIVE FINDINGS: Stenosis at C5-6, C6-7    OPERATIVE REPORT:   Indications [name] presented to our clinic on 9/13 with ongoing symptoms of left arm numbness and pain. 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 Given this, we recommended an anterior cervical decompression and arthroplasty to relieve the pressure on the nerves at these levels. The risks of hematoma, infection, migration of disc, spinal cord injury, weakness, numbness, neck pain, stroke, and death were discussed in detail. All questions were answered and the patient elected to proceed with the surgery.     Procedure After obtaining informed consent, the patient was taken to the Operating Room where general anesthesia was induced and the patient intubated. Vascular access was obtained. Decadron was administered.  The head was slightly extended and imaging used to identify a skin crease overlying the C6 vertebral body.     The patient was prepped and draped in the usual sterile fashion and a timeout was performed per protocol. Local anesthesia was instilled with epinephrine along the planned incision site. A transverse cervical incision was performed on the left. The incision was carried to the level of the platysma and then cautery was used to incise the muscle. Blunt dissection was used to expand the plane and the dissection was carried deep medial to the SCM and carotid sheath being careful to identify the trachea and esophagus medially. The prevertebral fascia was identified and this was  bluntly dissected to expose the disc spaces.  A needle was placed beside the disc space and x-ray confirmed the C5-6 disc level.    Next, cautery was used to undermine the longus colli muscles bilaterally and identify the C5-6 disc space. Caspar pins were placed at C5 and C6 and a retractor system placed under the muscles to complete the exposure. Next, a combination of curettes and Kerrison rongeurs were used to remove the disc material.   The microscope was brought into the field for the remainder of the surgery. The drill was used to remove the osteophyte/disc complex deep to the level of the PLL at C5-6. There was significant disc protrusion on the left at this level that was removed with rongeurs. The PLL was entered with a hook and then the PLL was removed along with remaining disc material to decompress centrally and then out into bilateral neuro foramen.  A blunt probe was used to confirm no residual stenosis laterally and a curette used to ensure no compression remained. Floseal was used for hemostasis.    Next, the trial was used to adequately size the artificial disc. Fluoroscopy was used to confirm sizing. A 43mm x 43mm x 35mm artificial disc was used and tamped into position using fluoroscopy to guide.  The disc was placed deep until anterior edge was flush with bone. Xray was obtained confirming good graft placement. The inserter was removed and further xray taken. The caspar pin was moved from C5-C7 and bone wax placed.  Next, cautery was used to undermine the longus colli muscles bilaterally and identify the C6/7 disc  space. Caspar pins had been placed at C6 and C7 and a retractor system placed under the muscles to complete the exposure. Next, a combination of curettes and Kerrison rongeurs were used to remove the disc material.   The microscope was brought into the field for the remainder of the surgery. The drill was used to remove the osteophyte/disc complex deep to the level of the PLL  at C6/7. The PLL was entered with a hook and then the PLL was removed along with remaining disc material to decompress centrally and then out into bilateral neuro foramen. There was a disc fragment pulled from the left side foramen.  A blunt probe was used to confirm no residual stenosis laterally and a curette used to ensure no compression remained. Floseal was used for hemostasis.     Next, the trial was used to adequately size the artificial disc. Fluoroscopy was used to confirm sizing. A 29mm x 20mm x 51mm artificial disc was used and tamped into position using fluoroscopy to guide.  The disc was placed deep until anterior edge was flush with bone. Xray was obtained confirming good graft placement. The inserter was removed and further xray taken. The caspar pins were removed and bone wax placed.      The retractors were removed. The wound was irrigated copiously and hemostasis obtained. The platysma was closed with 2-0 vicryl suture. The dermis was closed with 3-0 vicryl and Dermabond was placed on the skin.     The patient had general anesthesia reversed and was extubated following the procedure. The patient awoke following commands with symmetric movement. The patient was taken to the PACU where recovery continued. Following the procedure, I spoke with the patient's family about the procedure and answered all questions.         ESTIMATED BLOOD LOSS:    20 mL      IMPLANT DISC MOBI-C CERVICAL 13X15 H5 - [id number]  Inventory Item: DISC MOBI-C CERVICAL 13X15 H5 Serial no.:  Model/Cat no.: [id number]  Implant name: DISC MOBI-C CERVICAL 13X15 H5 - [id number] Laterality: N/A Area: Spine Cervical  Manufacturer: [employer] Date of Manufacture:    Action: Implanted Number Used: 1   Device Identifier:  Device Identifier Type:     DISC MOBI-C CERVICAL 13X15 H5 - [id number]  Inventory Item: DISC MOBI-C CERVICAL 13X15 H5 Serial no.:  Model/Cat no.: [id number]  Implant name: DISC MOBI-C CERVICAL 13X15 H5 -  [id number] Laterality: N/A Area: Spine Cervical  Manufacturer: [employer] Date of Manufacture:    Action: Implanted Number Used: 1   Device Identifier:  Device Identifier Type:        I performed the case in its entirety with assistance of PA, Flora Lipps, MD [phone]

## 2021-10-07 NOTE — Anesthesia Preprocedure Evaluation (Signed)
Anesthesia Evaluation  Patient identified by MRN, date of birth, ID band Patient awake    Reviewed: Allergy & Precautions, H&P , NPO status , Patient's Chart, lab work & pertinent test results, reviewed documented beta blocker date and time   History of Anesthesia Complications Negative for: history of anesthetic complications  Airway Mallampati: II  TM Distance: >3 FB Neck ROM: full    Dental  (+) Caps, Dental Advidsory Given, Teeth Intact   Pulmonary neg pulmonary ROS, former smoker,    Pulmonary exam normal breath sounds clear to auscultation       Cardiovascular Exercise Tolerance: Good hypertension, (-) angina(-) Past MI and (-) Cardiac Stents + dysrhythmias (s/p ablation, but still having episodes) Supra Ventricular Tachycardia + Valvular Problems/Murmurs MVP  Rhythm:regular Rate:Normal     Neuro/Psych  Headaches, neg Seizures  Neuromuscular disease (fibromyalgia) negative psych ROS   GI/Hepatic Neg liver ROS, GERD  ,  Endo/Other  negative endocrine ROS  Renal/GU CRFRenal disease  negative genitourinary   Musculoskeletal   Abdominal   Peds  Hematology negative hematology ROS (+)   Anesthesia Other Findings Past Medical History: No date: Atypical chest pain No date: Atypical migraine No date: Carpal tunnel syndrome No date: 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 No date: Chronic pain syndrome No date: Fibromyalgia No date: Hypercholesteremia No date: Hypertension No date: Left lumbar radiculopathy No date: Migraines No date: Mitral valve prolapse No date: Orthostatic syncope No date: Palpitations No date: Psoriatic arthritis (HCC) No date: Sacral back pain No date: SVT (supraventricular tachycardia) (HCC) No date: Synovitis and tenosynovitis   Reproductive/Obstetrics negative OB ROS                             Anesthesia Physical Anesthesia Plan  ASA:  3  Anesthesia Plan: General   Post-op Pain Management:    Induction: Intravenous  PONV Risk Score and Plan: 3 and Ondansetron, Dexamethasone, Midazolam, Treatment may vary due to age or medical condition and Promethazine  Airway Management Planned: Oral ETT  Additional Equipment:   Intra-op Plan:   Post-operative Plan: Extubation in OR  Informed Consent: I have reviewed the patients History and Physical, chart, labs and discussed the procedure including the risks, benefits and alternatives for the proposed anesthesia with the patient or authorized representative who has indicated his/her understanding and acceptance.     Dental Advisory Given  Plan Discussed with: Anesthesiologist, CRNA and Surgeon  Anesthesia Plan Comments:         Anesthesia Quick Evaluation

## 2021-10-07 NOTE — Anesthesia Procedure Notes (Signed)
Procedure Name: Intubation Date/Time: 10/07/2021 2:43 PM Performed by: Sandra Tere Mcconaughey, CRNA Pre-anesthesia Checklist: Patient identified, Patient being monitored, Timeout performed, Emergency Drugs available and Suction available Patient Re-evaluated:Patient Re-evaluated prior to induction Oxygen Delivery Method: Circle system utilized Preoxygenation: Pre-oxygenation with 100% oxygen Induction Type: IV induction Ventilation: Mask ventilation without difficulty Laryngoscope Size: McGraph and 4 Grade View: Grade I Tube type: Oral Tube size: 7.0 mm Number of attempts: 1 Airway Equipment and Method: Stylet Placement Confirmation: ETT inserted through vocal cords under direct vision, positive ETCO2 and breath sounds checked- equal and bilateral Secured at: 21 cm Tube secured with: Tape Dental Injury: Teeth and Oropharynx as per pre-operative assessment

## 2021-10-08 ENCOUNTER — Encounter: Payer: Self-pay | Admitting: Neurosurgery

## 2021-10-08 DIAGNOSIS — M5412 Radiculopathy, cervical region: Secondary | ICD-10-CM | POA: Diagnosis not present

## 2021-10-08 MED ORDER — METHOCARBAMOL 500 MG PO TABS: 500.0000 mg | ORAL_TABLET | Freq: Four times a day (QID) | ORAL | 0 refills | Status: AC

## 2021-10-08 MED ORDER — SENNA 8.6 MG PO TABS: 1.0000 | ORAL_TABLET | Freq: Two times a day (BID) | ORAL | 0 refills | Status: AC

## 2021-10-08 MED ORDER — HYDROCODONE-ACETAMINOPHEN 5-325 MG PO TABS: 2.0000 | ORAL_TABLET | Freq: Four times a day (QID) | ORAL | 0 refills | Status: AC | PRN

## 2021-10-08 MED ORDER — CELECOXIB 200 MG PO CAPS: 200.0000 mg | ORAL_CAPSULE | Freq: Two times a day (BID) | ORAL | 0 refills | Status: AC | PRN

## 2021-10-08 NOTE — Evaluation (Signed)
Physical Therapy Evaluation Patient Details Name: Sandra Doyle MRN: [id number] DOB: 1976/02/11 Today's Date: 10/08/2021  History of Present Illness  45 y/o female s/p C5-7 arthroplasty 10/07/21.  Clinical Impression  Pt able to circumambulate the nurses' station w/o AD and w/o overt safety concerns.  She has some issues with mobility at baseline and has inherent guarding from multilevel cervical spinal repair, but overall did well enough to home home. She states she will have 24/7 assist when she initially goes home.  She was able to negotiate up/down steps and reports feeling relatively confident with mobility.  Pt safe to return home, further PT f/u per neuro recommendations.       Recommendations for follow up therapy are one component of a multi-disciplinary discharge planning process, led by the attending physician.  Recommendations may be updated based on patient status, additional functional criteria and insurance authorization.  Follow Up Recommendations Follow physician's recommendations for discharge plan and follow up therapies    Assistance Recommended at Discharge PRN  Functional Status Assessment Patient has had a recent decline in their functional status and demonstrates the ability to make significant improvements in function in a reasonable and predictable amount of time.  Equipment Recommendations  None recommended by PT    Recommendations for Other Services       Precautions / Restrictions Precautions Precautions: Cervical (no formal precautions, educated on appropriate posturing/positioning) Required Braces or Orthoses:  (no neck brace needed) Restrictions Weight Bearing Restrictions: Yes Other Position/Activity Restrictions: post surgical lifting/motion limitations      Mobility  Bed Mobility Overal bed mobility: Modified Independent                  Transfers Overall transfer level: Modified independent Equipment used: None                General transfer comment: minimal hesitancy, but able to transfer w/o assist or safety issuees    Ambulation/Gait Ambulation/Gait assistance: Supervision Gait Distance (Feet): 250 Feet Assistive device: None       General Gait Details: Pt with safe but guarded gait.  No LOBs, no fatigue, good overall tolerance  Stairs Stairs: Yes Stairs assistance: Min guard Stair Management: One rail Right Number of Stairs: 6 General stair comments: easily and confidently negotiate up/down steps w/o assist, minimal UE use  Wheelchair Mobility    Modified Rankin (Stroke Patients Only)       Balance Overall balance assessment: Modified Independent                                           Pertinent Vitals/Pain Pain Assessment: 0-10 Pain Score: 5  Pain Location: posterior neck    Home Living Family/patient expects to be discharged to:: Private residence Living Arrangements: Children;Other relatives     Home Access: Stairs to enter   Entrance Stairs-Number of Steps: 6            Prior Function Prior Level of Function : Independent/Modified Independent             Mobility Comments: pt does not work (disability) but can be active and relatively independent with moderate amount of activity       Hand Dominance        Extremity/Trunk Assessment   Upper Extremity Assessment Upper Extremity Assessment: Generalized weakness (improved sensation and coordination since sx)    Lower Extremity Assessment  Lower Extremity Assessment: Overall WFL for tasks assessed (baseline achilles issues as well as some mild weakness)       Communication   Communication: No difficulties  Cognition Arousal/Alertness: Awake/alert Behavior During Therapy: WFL for tasks assessed/performed Overall Cognitive Status: Within Functional Limits for tasks assessed                                          General Comments      Exercises      Assessment/Plan    PT Assessment Patient does not need any further PT services  PT Problem List Decreased coordination;Decreased balance;Decreased mobility;Decreased strength;Decreased activity tolerance;Decreased safety awareness       PT Treatment Interventions Gait training;Patient/family education;Therapeutic exercise;Therapeutic activities;Stair training;Functional mobility training    PT Goals (Current goals can be found in the Care Plan section)  Acute Rehab PT Goals Patient Stated Goal: Go home PT Goal Formulation: All assessment and education complete, DC therapy    Frequency     Barriers to discharge        Co-evaluation               AM-PAC PT "6 Clicks" Mobility  Outcome Measure Help needed turning from your back to your side while in a flat bed without using bedrails?: None Help needed moving from lying on your back to sitting on the side of a flat bed without using bedrails?: None Help needed moving to and from a bed to a chair (including a wheelchair)?: None Help needed standing up from a chair using your arms (e.g., wheelchair or bedside chair)?: None Help needed to walk in hospital room?: None Help needed climbing 3-5 steps with a railing? : None 6 Click Score: 24    End of Session Equipment Utilized During Treatment: Gait belt Activity Tolerance: Patient tolerated treatment well;Patient limited by pain Patient left: in chair Nurse Communication: Mobility status PT Visit Diagnosis: Pain;Muscle weakness (generalized) (M62.81) Pain - part of body:  (cervicalgia)    Time: 7824-2353 PT Time Calculation (min) (ACUTE ONLY): 25 min   Charges:   PT Evaluation $PT Eval Low Complexity: 1 Low          Malachi Pro, DPT 10/08/2021, 12:19 PM

## 2021-10-08 NOTE — Discharge Summary (Signed)
Physician Discharge Summary  Patient ID: Sandra Doyle MRN: [id number] DOB/AGE: 47-Feb-1977 47 y.o.  Admit date: 10/07/2021 Discharge date: 10/08/2021  Admission Diagnoses: cervical radiculopathy   Discharge Diagnoses:  Active Problems:   Cervical radiculopathy   Discharged Condition: good  Hospital Course: Sandra Doyle is a 45 y.o presenting with cervical radiculopathy s/p C5-7 arthoplasty. Her interoperative course was uncomplicated and she was admitted overnight for pain control and monitoring. She reports improvement of her pre-op arm numbness. She reports swallowing well. She was evaluated by PT and deemed appropriate for discharge. She was discharged with Norco, Robaxin, Senna, and Celebrex.   Consults: None  Significant Diagnostic Studies: none  Treatments: surgery: AS above. See separately dictated operative report for further details  Discharge Exam: Blood pressure (!) 150/99, pulse 90, temperature 97.9 F (36.6 C), temperature source Oral, resp. rate 16, height 5\' 4"  (1.626 m), weight 72.6 kg, SpO2 96 %. CN II-XII grossly intact 5/5 throughout BUE Incision c/d/I with dermabond in place Trachea midline  Disposition:   Discharge Instructions     Incentive spirometry RT   Complete by: As directed       Allergies as of 10/08/2021       Reactions   Seasonal Ic [cholestatin] Anaphylaxis, Hives, Shortness Of Breath   Patient has an epi pen for "everything" including seasonal allergies. She said she is very reactive to everyday allergens outside, cats, dogs and cockroaches         Medication List     STOP taking these medications    acetaminophen 650 MG CR tablet Commonly known as: TYLENOL   EPINEPHrine 0.3 mg/0.3 mL Soaj injection Commonly known as: EPI-PEN       TAKE these medications    ARIPiprazole 10 MG tablet Commonly known as: ABILIFY Take 10 mg by mouth at bedtime.   celecoxib 200 MG capsule Commonly known as: CELEBREX Take 1  capsule (200 mg total) by mouth every 12 (twelve) hours as needed for moderate pain.   cetirizine 10 MG tablet Commonly known as: ZYRTEC Take 10 mg by mouth daily.   chlorhexidine 0.12 % solution Commonly known as: PERIDEX Use as directed 15 mLs in the mouth or throat 2 (two) times daily.   clobetasol ointment 0.05 % Commonly known as: TEMOVATE Apply 1 application topically 2 (two) times daily.   DULoxetine 60 MG capsule Commonly known as: Cymbalta Take 1 capsule (60 mg total) by mouth daily. What changed: when to take this   fluticasone 50 MCG/ACT nasal spray Commonly known as: FLONASE Place 1 spray into both nostrils 2 (two) times daily as needed for allergies or rhinitis.   folic acid 1 MG tablet Commonly known as: FOLVITE Take 1 mg by mouth daily.   HYDROcodone-acetaminophen 5-325 MG tablet Commonly known as: NORCO/VICODIN Take 2 tablets by mouth every 6 (six) hours as needed for up to 7 days for severe pain ((score 7 to 10)).   linaclotide 145 MCG Caps capsule Commonly known as: LINZESS Take 145 mcg by mouth at bedtime.   methocarbamol 500 MG tablet Commonly known as: ROBAXIN Take 1 tablet (500 mg total) by mouth every 6 (six) hours.   nortriptyline 10 MG capsule Commonly known as: PAMELOR Take 2 capsules (20 mg total) by mouth at bedtime.   Opzelura 1.5 % Crea Generic drug: Ruxolitinib Phosphate Apply 1 application topically at bedtime.   pregabalin 75 MG capsule Commonly known as: Lyrica Take 1 capsule (75 mg total) by mouth 3 (three) times daily.  rosuvastatin 40 MG tablet Commonly known as: CRESTOR Take 40 mg by mouth every evening.   senna 8.6 MG Tabs tablet Commonly known as: SENOKOT Take 1 tablet (8.6 mg total) by mouth 2 (two) times daily.   Sodium Fluoride 5000 Sensitive 1.1-5 % Gel Generic drug: Sod Fluoride-Potassium Nitrate Place 1 application onto teeth daily.   sulfaSALAzine 500 MG EC tablet Commonly known as: AZULFIDINE Take 1,000  mg by mouth in the morning and at bedtime.   SUMAtriptan 100 MG tablet Commonly known as: IMITREX Take 100 mg by mouth every 2 (two) hours as needed for migraine.   tacrolimus 0.1 % ointment Commonly known as: PROTOPIC Apply 1 application topically 2 (two) times daily.   triamcinolone cream 0.5 % Commonly known as: KENALOG Apply 1 application topically 2 (two) times daily.        Follow-up Information     Susanne Borders, PA Follow up in 2 week(s).   Why: for post-op follow up. This appointment should already be scheduled with the [facility]. Please call with any questions regarding appointment date or time Contact information: [address] [place] [address] [phone]                 Signed: Susanne Borders 10/08/2021, 8:13 AM

## 2021-10-08 NOTE — Progress Notes (Signed)
    Attending Progress Note  History: Sandra Doyle presenting with cervical radiculopathy s/p C5-7 arthoplasty.   POD#1: NAEO. Pt tolerating PO intake and was able to ambulate overnight. Reports good pain control on current medications  Physical Exam: Vitals:   10/08/21 0533 10/08/21 0726  BP: (!) 152/114 (!) 150/99  Pulse: 92 90  Resp: 16 16  Temp: 97.6 F (36.4 C) 97.9 F (36.6 C)  SpO2: 97% 96%    AA Ox3 CNI  Strength:5/5 throughout BUE Incision c/d/I with dermabond in place  Data:  No results for input(s): NA, K, CL, CO2, BUN, CREATININE, LABGLOM, GLUCOSE, CALCIUM in the last 168 hours. No results for input(s): AST, ALT, ALKPHOS in the last 168 hours.  Invalid input(s): TBILI   No results for input(s): WBC, HGB, HCT, PLT in the last 168 hours. No results for input(s): APTT, INR in the last 168 hours.       Other tests/results: none  Assessment/Plan:  Sandra Doyle is a 45 y.o presenting with cervical radiculopathy s/p C5-7 arthoplasty.   - mobilize - pain control - DVT prophylaxis - PT  Manning Charity PA-C Department of Neurosurgery

## 2021-10-08 NOTE — Plan of Care (Signed)
  Problem: Education: Goal: Knowledge of General Education information will improve Description: Including pain rating scale, medication(s)/side effects and non-pharmacologic comfort measures Outcome: Progressing   Problem: Activity: Goal: Risk for activity intolerance will decrease Outcome: Progressing   Problem: Nutrition: Goal: Adequate nutrition will be maintained Outcome: Progressing   Problem: Coping: Goal: Level of anxiety will decrease Outcome: Progressing   Problem: Pain Managment: Goal: General experience of comfort will improve Outcome: Progressing   Problem: Safety: Goal: Ability to remain free from injury will improve Outcome: Progressing   Problem: Skin Integrity: Goal: Risk for impaired skin integrity will decrease Outcome: Progressing   Problem: Activity: Goal: Ability to tolerate increased activity will improve Outcome: Progressing   Problem: Respiratory: Goal: Will regain and/or maintain adequate ventilation Outcome: Progressing   Problem: Skin Integrity: Goal: Demonstration of wound healing without infection will improve Outcome: Progressing

## 2021-10-10 ENCOUNTER — Encounter: Payer: Self-pay | Admitting: Neurosurgery

## 2021-10-15 ENCOUNTER — Encounter: Payer: Self-pay | Admitting: Neurosurgery

## 2021-10-16 ENCOUNTER — Other Ambulatory Visit: Payer: Self-pay | Admitting: Student in an Organized Health Care Education/Training Program

## 2021-10-16 DIAGNOSIS — M542 Cervicalgia: Secondary | ICD-10-CM

## 2021-10-16 DIAGNOSIS — G894 Chronic pain syndrome: Secondary | ICD-10-CM

## 2021-10-16 DIAGNOSIS — M797 Fibromyalgia: Secondary | ICD-10-CM

## 2021-10-16 DIAGNOSIS — M5412 Radiculopathy, cervical region: Secondary | ICD-10-CM

## 2022-12-19 ENCOUNTER — Other Ambulatory Visit: Payer: Self-pay | Admitting: Family Medicine

## 2022-12-19 DIAGNOSIS — Z1231 Encounter for screening mammogram for malignant neoplasm of breast: Secondary | ICD-10-CM

## 2022-12-29 ENCOUNTER — Ambulatory Visit
Admission: RE | Admit: 2022-12-29 | Discharge: 2022-12-29 | Disposition: A | Discharge status: Home / Self Care | Payer: Medicaid Other | Source: Ambulatory Visit | Attending: Family Medicine | Admitting: Family Medicine

## 2022-12-29 DIAGNOSIS — Z1231 Encounter for screening mammogram for malignant neoplasm of breast: Secondary | ICD-10-CM | POA: Diagnosis present

## 2023-10-02 ENCOUNTER — Encounter: Payer: Self-pay | Admitting: Emergency Medicine

## 2023-10-02 ENCOUNTER — Ambulatory Visit
Admission: EM | Admit: 2023-10-02 | Discharge: 2023-10-02 | Disposition: A | Discharge status: Home / Self Care | Payer: Medicaid Other | Attending: Internal Medicine | Admitting: Internal Medicine

## 2023-10-02 ENCOUNTER — Ambulatory Visit (INDEPENDENT_AMBULATORY_CARE_PROVIDER_SITE_OTHER): Payer: Medicaid Other

## 2023-10-02 DIAGNOSIS — M79642 Pain in left hand: Secondary | ICD-10-CM

## 2023-10-02 DIAGNOSIS — S60222A Contusion of left hand, initial encounter: Secondary | ICD-10-CM | POA: Diagnosis not present

## 2023-10-02 NOTE — ED Triage Notes (Addendum)
Patient states that she was putting her arm through her coat sleeve and hit the back of her left hand on a wooden chair.  Patient c/o left hand pain.  Patient unable to make a fist.

## 2023-10-02 NOTE — Discharge Instructions (Addendum)
Elevate, ice, and continue ibuprofen over-the-counter as needed.  Ace wrap to the hand as needed for compression and support.  Please follow-up with your PCP if your symptoms do not improve.  Please go to the ER for any worsening symptoms.  I hope you feel better soon!

## 2023-10-02 NOTE — ED Provider Notes (Addendum)
[facility]    CSN: [id number] Arrival date & time: 10/02/23  1302      History   Chief Complaint Chief Complaint  Patient presents with   Hand Injury    left    HPI Sandra Doyle is a 47 y.o. female presents for hand pain.  Patient is right-hand dominant.  Patient reports last night while putting on her coat she excellently hit the back of her left hand on a chair.  Reports since then she has been having pain, swelling and throbbing of the back of the hand.  Intermittent numbness and tingling.  No bruising.  No history of injuries or surgeries to the hand in the past.  She has been taking ibuprofen and icing which has given her some relief.  No other injuries or concerns at this time.   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   Chronic pain syndrome    Fibromyalgia    Hypercholesteremia    Hypertension    Left lumbar radiculopathy    Migraines    Mitral valve prolapse    Orthostatic syncope    Palpitations    Psoriatic arthritis (HCC)    Sacral back pain    SVT (supraventricular tachycardia) (HCC)    Synovitis and tenosynovitis     Patient Active Problem List   Diagnosis Date Noted   Cervical radiculopathy 10/07/2021   Cervical radicular pain 07/11/2021   Cervicalgia 07/11/2021   Easy bruising 04/24/2021   Chronic migraine without aura without status migrainosus, not intractable 04/11/2020   Fibromyalgia 02/16/2020   Psoriatic arthritis (HCC) 02/16/2020   Chronic pain syndrome 02/16/2020   Left lumbar radiculopathy 04/17/2016   Sacral back pain 04/17/2016    Past Surgical History:  Procedure Laterality Date   ABDOMINAL HYSTERECTOMY     CARDIAC ELECTROPHYSIOLOGY MAPPING AND ABLATION     CARDIAC SURGERY     ablasion   CERVICAL DISC ARTHROPLASTY N/A 10/07/2021   Procedure: C5/6, C6/7 ARTHROPLASTY;   Surgeon: Lucy Chris, MD;  Location: [facility] ORS;  Service: Neurosurgery;  Laterality: N/A;   CERVICAL SPINE SURGERY     WRIST SURGERY      OB History   No obstetric history on file.      Home Medications    Prior to Admission medications   Medication Sig Start Date End Date Taking? Authorizing Provider  cetirizine (ZYRTEC) 10 MG tablet Take 10 mg by mouth daily.   Yes [provider]  fluticasone (FLONASE) 50 MCG/ACT nasal spray Place 1 spray into both nostrils 2 (two) times daily as needed for allergies or rhinitis.   Yes [provider]  rosuvastatin (CRESTOR) 40 MG tablet Take 40 mg by mouth every evening.   Yes [provider]  ARIPiprazole (ABILIFY) 10 MG tablet Take 10 mg by mouth at bedtime. 09/10/21   [provider]  celecoxib (CELEBREX) 200 MG capsule Take 1 capsule (200 mg total) by mouth every 12 (twelve) hours as needed for moderate pain. 10/08/21   Susanne Borders, PA  chlorhexidine (PERIDEX) 0.12 % solution Use as directed 15 mLs in the mouth or throat 2 (two) times daily. 01/24/21   [provider]  clobetasol ointment (TEMOVATE) 0.05 % Apply 1 application topically 2 (two) times daily. 04/17/21   [provider]  DULoxetine (CYMBALTA) 60 MG capsule Take 1 capsule (60 mg total) by mouth daily. Patient taking differently: Take 60 mg by mouth in the morning. 09/23/21 01/21/22  Edward Jolly, MD  folic acid (FOLVITE) 1 MG tablet Take 1 mg by mouth daily. 05/23/21   [provider]  linaclotide (LINZESS) 145 MCG CAPS capsule Take 145 mcg by mouth at bedtime. 02/06/21   [provider]  methocarbamol (ROBAXIN) 500 MG tablet Take 1 tablet (500 mg total) by mouth every 6 (six) hours. 10/08/21   Susanne Borders, PA  nortriptyline (PAMELOR) 10 MG capsule Take 2 capsules (20 mg total) by mouth at bedtime. 09/23/21 01/21/22  Edward Jolly, MD  OPZELURA 1.5 % CREA Apply 1 application topically at bedtime. 04/11/21    [provider]  pregabalin (LYRICA) 75 MG capsule Take 1 capsule (75 mg total) by mouth 3 (three) times daily. 09/23/21 01/21/22  Edward Jolly, MD  senna (SENOKOT) 8.6 MG TABS tablet Take 1 tablet (8.6 mg total) by mouth 2 (two) times daily. 10/08/21   Susanne Borders, PA  Sod Fluoride-Potassium Nitrate (SODIUM FLUORIDE 5000 SENSITIVE) 1.1-5 % GEL Place 1 application onto teeth daily.    [provider]  sulfaSALAzine (AZULFIDINE) 500 MG EC tablet Take 1,000 mg by mouth in the morning and at bedtime. 06/20/21   [provider]  SUMAtriptan (IMITREX) 100 MG tablet Take 100 mg by mouth every 2 (two) hours as needed for migraine. 07/17/20   [provider]  tacrolimus (PROTOPIC) 0.1 % ointment Apply 1 application topically 2 (two) times daily. 04/17/21   [provider]  triamcinolone cream (KENALOG) 0.5 % Apply 1 application topically 2 (two) times daily. 04/03/20   [provider]    Family History Family History  Problem Relation Age of Onset   Hypertension Mother    Stroke Mother    Osteoarthritis Mother    Anuerysm Father     Social History Social History   Tobacco Use   Smoking status: Former    Current packs/day: 0.00    Types: Cigarettes    Quit date: 2017    Years since quitting: 7.8   Smokeless tobacco: Never  Vaping Use   Vaping status: Some Days  Substance Use Topics   Alcohol use: Yes    Comment: occasionally   Drug use: No     Allergies   Seasonal ic [cholestatin]   Review of Systems Review of Systems  Musculoskeletal:        Left hand pain      Physical Exam Triage Vital Signs ED Triage Vitals  Encounter Vitals Group     BP 10/02/23 1333 (!) 145/97     Systolic BP Percentile --      Diastolic BP Percentile --      Pulse Rate 10/02/23 1333 66     Resp 10/02/23 1333 14     Temp 10/02/23 1333 98.1 F (36.7 C)     Temp Source 10/02/23 1333 Oral     SpO2 10/02/23 1333 100 %     Weight 10/02/23 1328  150 lb (68 kg)     Height 10/02/23 1328 5\' 4"  (1.626 m)     Head Circumference --      Peak Flow --      Pain Score 10/02/23 1328 5     Pain Loc --      Pain Education --      Exclude from Growth Chart --    No  data found.  Updated Vital Signs BP (!) 145/97 (BP Location: Right Arm)   Pulse 66   Temp 98.1 F (36.7 C) (Oral)   Resp 14   Ht 5\' 4"  (1.626 m)   Wt 150 lb (68 kg)   SpO2 100%   BMI 25.75 kg/m   Visual Acuity Right Eye Distance:   Left Eye Distance:   Bilateral Distance:    Right Eye Near:   Left Eye Near:    Bilateral Near:     Physical Exam Vitals and nursing note reviewed.  Constitutional:      Appearance: Normal appearance.  HENT:     Head: Normocephalic and atraumatic.  Eyes:     Pupils: Pupils are equal, round, and reactive to light.  Cardiovascular:     Rate and Rhythm: Normal rate.  Pulmonary:     Effort: Pulmonary effort is normal.  Musculoskeletal:       Hands:     Comments: Minimal swelling with very faint ecchymosis overlying the mid 3rd through 5th metacarpals.  Tender to palpation to site.  Full range of motion of fingers with flexion and extension that does cause some discomfort.  Cap refill +2 in all digits.  Skin is intact.  Skin:    General: Skin is warm and dry.  Neurological:     General: No focal deficit present.     Mental Status: She is alert and oriented to person, place, and time.  Psychiatric:        Mood and Affect: Mood normal.        Behavior: Behavior normal.      UC Treatments / Results  Labs (all labs ordered are listed, but only abnormal results are displayed) Labs Reviewed - No data to display  EKG   Radiology DG Hand Complete Left  Result Date: 10/02/2023 CLINICAL DATA:  Left hand pain due to injury yesterday. EXAM: LEFT HAND - COMPLETE 3+ VIEW COMPARISON:  05/12/2021 FINDINGS: There is no evidence of fracture or dislocation. The alignment and joint spaces are preserved. There is no evidence of  arthropathy or other focal bone abnormality. Soft tissues are unremarkable. IMPRESSION: Negative radiographs of the left hand. Electronically Signed   By: Narda Rutherford M.D.   On: 10/02/2023 13:55    Procedures Procedures (including critical care time)  Medications Ordered in UC Medications - No data to display  Initial Impression / Assessment and Plan / UC Course  I have reviewed the triage vital signs and the nursing notes.  Pertinent labs & imaging results that were available during my care of the patient were reviewed by me and considered in my medical decision making (see chart for details).     Reviewed exam and symptoms with patient.  No red flags.  X-ray negative for fracture.  Discussed contusion of hand.  RICE therapy reviewed and Ace wrap applied in clinic.  Patient to continue OTC analgesics and ice as needed.  PCP follow-up as symptoms do not improve.  ER precautions reviewed and patient verbalized understanding. Final Clinical Impressions(s) / UC Diagnoses   Final diagnoses:  Contusion of left hand, initial encounter     Discharge Instructions      Elevate, ice, and continue ibuprofen over-the-counter as needed.  Ace wrap to the hand as needed for compression and support.  Please follow-up with your PCP if your symptoms do not improve.  Please go to the ER for any worsening symptoms.  I hope you feel better soon!  ED Prescriptions   None    PDMP not reviewed this encounter.   Radford Pax, NP 10/02/23 1403    Radford Pax, NP 10/02/23 [phone]

## 2023-10-30 ENCOUNTER — Other Ambulatory Visit: Payer: Self-pay

## 2023-10-30 ENCOUNTER — Emergency Department: Payer: Medicaid Other

## 2023-10-30 ENCOUNTER — Emergency Department
Admission: EM | Admit: 2023-10-30 | Discharge: 2023-10-31 | Disposition: A | Discharge status: Home / Self Care | Payer: Medicaid Other | Attending: Emergency Medicine | Admitting: Emergency Medicine

## 2023-10-30 ENCOUNTER — Ambulatory Visit
Admission: EM | Admit: 2023-10-30 | Discharge: 2023-10-30 | Disposition: A | Discharge status: Home / Self Care | Payer: Medicaid Other | Attending: Family Medicine | Admitting: Family Medicine

## 2023-10-30 DIAGNOSIS — K921 Melena: Secondary | ICD-10-CM | POA: Diagnosis not present

## 2023-10-30 DIAGNOSIS — K5792 Diverticulitis of intestine, part unspecified, without perforation or abscess without bleeding: Secondary | ICD-10-CM

## 2023-10-30 DIAGNOSIS — R1030 Lower abdominal pain, unspecified: Secondary | ICD-10-CM

## 2023-10-30 DIAGNOSIS — R1032 Left lower quadrant pain: Secondary | ICD-10-CM | POA: Diagnosis present

## 2023-10-30 DIAGNOSIS — K5732 Diverticulitis of large intestine without perforation or abscess without bleeding: Secondary | ICD-10-CM | POA: Insufficient documentation

## 2023-10-30 LAB — URINALYSIS, ROUTINE W REFLEX MICROSCOPIC
Bacteria, UA: NONE SEEN
Bilirubin Urine: NEGATIVE
Glucose, UA: NEGATIVE mg/dL
Ketones, ur: 5 mg/dL — AB
Leukocytes,Ua: NEGATIVE
Nitrite: NEGATIVE
Protein, ur: NEGATIVE mg/dL
Specific Gravity, Urine: 1.016 (ref 1.005–1.030)
pH: 5 (ref 5.0–8.0)

## 2023-10-30 LAB — CBC
HCT: 42 % (ref 36.0–46.0)
Hemoglobin: 14.3 g/dL (ref 12.0–15.0)
MCH: 31.9 pg (ref 26.0–34.0)
MCHC: 34 g/dL (ref 30.0–36.0)
MCV: 93.8 fL (ref 80.0–100.0)
Platelets: 318 10*3/uL (ref 150–400)
RBC: 4.48 MIL/uL (ref 3.87–5.11)
RDW: 12.4 % (ref 11.5–15.5)
WBC: 14.4 10*3/uL — ABNORMAL HIGH (ref 4.0–10.5)
nRBC: 0 % (ref 0.0–0.2)

## 2023-10-30 LAB — COMPREHENSIVE METABOLIC PANEL
ALT: 59 U/L — ABNORMAL HIGH (ref 0–44)
AST: 69 U/L — ABNORMAL HIGH (ref 15–41)
Albumin: 4.3 g/dL (ref 3.5–5.0)
Alkaline Phosphatase: 113 U/L (ref 38–126)
Anion gap: 12 (ref 5–15)
BUN: 14 mg/dL (ref 6–20)
CO2: 24 mmol/L (ref 22–32)
Calcium: 10.5 mg/dL — ABNORMAL HIGH (ref 8.9–10.3)
Chloride: 102 mmol/L (ref 98–111)
Creatinine, Ser: 0.95 mg/dL (ref 0.44–1.00)
GFR, Estimated: >60 mL/min (ref >60)
Glucose, Bld: 104 mg/dL — ABNORMAL HIGH (ref 70–99)
Potassium: 3.6 mmol/L (ref 3.5–5.1)
Sodium: 138 mmol/L (ref 135–145)
Total Bilirubin: 0.3 mg/dL (ref <1.2)
Total Protein: 7.9 g/dL (ref 6.5–8.1)

## 2023-10-30 LAB — LIPASE, BLOOD: Lipase: 29 U/L (ref 11–51)

## 2023-10-30 MED ORDER — ONDANSETRON HCL 4 MG/2ML IJ SOLN
4.0000 mg | Freq: Once | INTRAMUSCULAR | Status: AC
  Administered 2023-10-30: 4 mg via INTRAVENOUS
  Filled 2023-10-30: qty 2

## 2023-10-30 MED ORDER — DIPHENHYDRAMINE HCL 50 MG/ML IJ SOLN
50.0000 mg | Freq: Once | INTRAMUSCULAR | Status: AC
  Administered 2023-10-30: 50 mg via INTRAVENOUS

## 2023-10-30 MED ORDER — METRONIDAZOLE 500 MG PO TABS
500.0000 mg | ORAL_TABLET | Freq: Once | ORAL | Status: AC
  Administered 2023-10-30: 500 mg via ORAL
  Filled 2023-10-30: qty 1

## 2023-10-30 MED ORDER — METRONIDAZOLE 500 MG PO TABS: 500.0000 mg | ORAL_TABLET | Freq: Two times a day (BID) | ORAL | 0 refills | Status: DC

## 2023-10-30 MED ORDER — CIPROFLOXACIN HCL 500 MG PO TABS: 500.0000 mg | ORAL_TABLET | Freq: Two times a day (BID) | ORAL | 0 refills | Status: AC

## 2023-10-30 MED ORDER — CIPROFLOXACIN IN D5W 200 MG/100ML IV SOLN
200.0000 mg | Freq: Once | INTRAVENOUS | Status: AC
  Administered 2023-10-30: 200 mg via INTRAVENOUS
  Filled 2023-10-30: qty 100

## 2023-10-30 MED ORDER — FAMOTIDINE IN NACL 20-0.9 MG/50ML-% IV SOLN
20.0000 mg | Freq: Once | INTRAVENOUS | Status: AC
  Administered 2023-10-30: 20 mg via INTRAVENOUS
  Filled 2023-10-30: qty 50

## 2023-10-30 MED ORDER — MORPHINE SULFATE (PF) 4 MG/ML IV SOLN
4.0000 mg | Freq: Once | INTRAVENOUS | Status: AC
  Administered 2023-10-30: 4 mg via INTRAVENOUS
  Filled 2023-10-30: qty 1

## 2023-10-30 MED ORDER — DICYCLOMINE HCL 10 MG PO CAPS: 10.0000 mg | ORAL_CAPSULE | Freq: Three times a day (TID) | ORAL | 0 refills | Status: AC

## 2023-10-30 MED ORDER — HYDROCODONE-ACETAMINOPHEN 5-325 MG PO TABS: 1.0000 | ORAL_TABLET | ORAL | 0 refills | Status: AC | PRN

## 2023-10-30 MED ORDER — IOHEXOL 300 MG/ML  SOLN
100.0000 mL | Freq: Once | INTRAMUSCULAR | Status: AC | PRN
  Administered 2023-10-30: 100 mL via INTRAVENOUS

## 2023-10-30 MED ORDER — DOCUSATE SODIUM 100 MG PO CAPS: 100.0000 mg | ORAL_CAPSULE | Freq: Two times a day (BID) | ORAL | 0 refills | Status: AC

## 2023-10-30 MED ORDER — DIPHENHYDRAMINE HCL 50 MG/ML IJ SOLN
50.0000 mg | Freq: Once | INTRAMUSCULAR | Status: DC
  Filled 2023-10-30: qty 1

## 2023-10-30 NOTE — ED Notes (Signed)
Patient is being discharged from the Urgent Care and sent to the Emergency Department via Personal Vehicle. Per Dr. Rachael Darby, patient is in need of higher level of care due to Abdominal Pain and Black Stool. Patient is aware and verbalizes understanding of plan of care.  Vitals:   10/30/23 1504  BP: (!) 126/111  Pulse: 89  Temp: 98.9 F (37.2 C)  SpO2: 100%

## 2023-10-30 NOTE — ED Notes (Addendum)
Pt ambulatory to room. Pt is caox4 and in no acute distress.

## 2023-10-30 NOTE — ED Notes (Signed)
Pt given urine cup to provide specimen. Pt states "they got one at urgent care". Pt was advised we needed one here as well.

## 2023-10-30 NOTE — ED Provider Notes (Signed)
[facility]    CSN: [id number] Arrival date & time: 10/30/23  1441      History   Chief Complaint Chief Complaint  Patient presents with   Abdominal Pain    HPI Sandra Doyle is a 47 y.o. female.   HPI  Sandra Doyle presents for left lower abdominal pain.  Reports pain 4/10 currently but can get to 10/10.  She was sitting in the back deck last and laid there and cried.  She had black stool that started this morning but has been having black stools since yesterday.  Had 2 stools yesterday and one today.  No nausea or vomiting. Movement makes the pain worse. Resting in one position for a while helps. Tried some Tums but that didn't help.    Started having chills today but no fever.  Denies cold symptoms. 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   Chronic pain syndrome    Fibromyalgia    Hypercholesteremia    Hypertension    Left lumbar radiculopathy    Migraines    Mitral valve prolapse    Orthostatic syncope    Palpitations    Psoriatic arthritis (HCC)    Sacral back pain    SVT (supraventricular tachycardia) (HCC)    Synovitis and tenosynovitis     Patient Active Problem List   Diagnosis Date Noted   Cervical radiculopathy 10/07/2021   Cervical radicular pain 07/11/2021   Cervicalgia 07/11/2021   Easy bruising 04/24/2021   Chronic migraine without aura without status migrainosus, not intractable 04/11/2020   Fibromyalgia 02/16/2020   Psoriatic arthritis (HCC) 02/16/2020   Chronic pain syndrome 02/16/2020   Left lumbar radiculopathy 04/17/2016   Sacral back pain 04/17/2016    Past Surgical History:  Procedure Laterality Date   ABDOMINAL HYSTERECTOMY     CARDIAC ELECTROPHYSIOLOGY MAPPING AND ABLATION     CARDIAC SURGERY     ablasion   CERVICAL DISC ARTHROPLASTY  N/A 10/07/2021   Procedure: C5/6, C6/7 ARTHROPLASTY;  Surgeon: Lucy Chris, MD;  Location: [facility] ORS;  Service: Neurosurgery;  Laterality: N/A;   CERVICAL SPINE SURGERY     WRIST SURGERY      OB History   No obstetric history on file.      Home Medications    Prior to Admission medications   Medication Sig Start Date End Date Taking? Authorizing Provider  ARIPiprazole (ABILIFY) 10 MG tablet Take 10 mg by mouth at bedtime. 09/10/21   [provider]  celecoxib (CELEBREX) 200 MG capsule Take 1 capsule (200 mg total) by mouth every 12 (twelve) hours as needed for moderate pain. 10/08/21   Susanne Borders, PA  cetirizine (ZYRTEC) 10 MG tablet Take 10 mg by mouth daily.    [provider]  chlorhexidine (PERIDEX) 0.12 % solution Use as directed 15 mLs in the mouth or throat 2 (two) times daily. 01/24/21   [provider]  clobetasol ointment (TEMOVATE) 0.05 % Apply 1 application topically 2 (two) times daily. 04/17/21   [provider]  DULoxetine (CYMBALTA) 60 MG capsule Take 1 capsule (60 mg total) by mouth daily. Patient taking differently: Take 60 mg by mouth in the morning. 09/23/21 01/21/22  Edward Jolly, MD  fluticasone (FLONASE) 50 MCG/ACT nasal  spray Place 1 spray into both nostrils 2 (two) times daily as needed for allergies or rhinitis.    [provider]  folic acid (FOLVITE) 1 MG tablet Take 1 mg by mouth daily. 05/23/21   [provider]  linaclotide (LINZESS) 145 MCG CAPS capsule Take 145 mcg by mouth at bedtime. 02/06/21   [provider]  methocarbamol (ROBAXIN) 500 MG tablet Take 1 tablet (500 mg total) by mouth every 6 (six) hours. 10/08/21   Susanne Borders, PA  nortriptyline (PAMELOR) 10 MG capsule Take 2 capsules (20 mg total) by mouth at bedtime. 09/23/21 01/21/22  Edward Jolly, MD  OPZELURA 1.5 % CREA Apply 1 application topically at bedtime. 04/11/21   [provider]  pregabalin (LYRICA) 75 MG  capsule Take 1 capsule (75 mg total) by mouth 3 (three) times daily. 09/23/21 01/21/22  Edward Jolly, MD  rosuvastatin (CRESTOR) 40 MG tablet Take 40 mg by mouth every evening.    [provider]  senna (SENOKOT) 8.6 MG TABS tablet Take 1 tablet (8.6 mg total) by mouth 2 (two) times daily. 10/08/21   Susanne Borders, PA  Sod Fluoride-Potassium Nitrate (SODIUM FLUORIDE 5000 SENSITIVE) 1.1-5 % GEL Place 1 application onto teeth daily.    [provider]  sulfaSALAzine (AZULFIDINE) 500 MG EC tablet Take 1,000 mg by mouth in the morning and at bedtime. 06/20/21   [provider]  SUMAtriptan (IMITREX) 100 MG tablet Take 100 mg by mouth every 2 (two) hours as needed for migraine. 07/17/20   [provider]  tacrolimus (PROTOPIC) 0.1 % ointment Apply 1 application topically 2 (two) times daily. 04/17/21   [provider]  triamcinolone cream (KENALOG) 0.5 % Apply 1 application topically 2 (two) times daily. 04/03/20   [provider]    Family History Family History  Problem Relation Age of Onset   Hypertension Mother    Stroke Mother    Osteoarthritis Mother    Anuerysm Father     Social History Social History   Tobacco Use   Smoking status: Former    Current packs/day: 0.00    Types: Cigarettes    Quit date: 2017    Years since quitting: 7.8   Smokeless tobacco: Never  Vaping Use   Vaping status: Some Days  Substance Use Topics   Alcohol use: Yes    Comment: occasionally   Drug use: No     Allergies   Seasonal ic [cholestatin]   Review of Systems Review of Systems :negative unless otherwise stated in HPI.      Physical Exam Triage Vital Signs ED Triage Vitals  Encounter Vitals Group     BP 10/30/23 1504 (!) 126/111     Systolic BP Percentile --      Diastolic BP Percentile --      Pulse Rate 10/30/23 1504 89     Resp --      Temp 10/30/23 1504 98.9 F (37.2 C)     Temp Source 10/30/23 1504 Oral     SpO2 10/30/23  1504 100 %     Weight 10/30/23 1502 150 lb (68 kg)     Height 10/30/23 1502 5\' 3"  (1.6 m)     Head Circumference --      Peak Flow --      Pain Score 10/30/23 1500 10     Pain Loc --      Pain Education --      Exclude from Growth Chart --  No data found.  Updated Vital Signs BP (!) 126/111 (BP Location: Left Arm)   Pulse 89   Temp 98.9 F (37.2 C) (Oral)   Ht 5\' 3"  (1.6 m)   Wt 68 kg   SpO2 100%   BMI 26.57 kg/m   Visual Acuity Right Eye Distance:   Left Eye Distance:   Bilateral Distance:    Right Eye Near:   Left Eye Near:    Bilateral Near:     Physical Exam  GEN: uncomfortable appearing female, in no acute distress  RESP: no increased work of breathing ABD: Bowel sounds present. Soft, LLQ > RLQ tenderness to light palpation, non-distended.  No guarding, no rebound, no appreciable hepatosplenomegaly MSK: no extremity edema SKIN: warm, dry, no rash on visible skin   UC Treatments / Results  Labs (all labs ordered are listed, but only abnormal results are displayed) Labs Reviewed - No data to display  EKG  If EKG performed, see my interpretation and MDM section  Radiology No results found.   Procedures Procedures (including critical care time)  Medications Ordered in UC Medications - No data to display  Initial Impression / Assessment and Plan / UC Course  I have reviewed the triage vital signs and the nursing notes.  Pertinent labs & imaging results that were available during my care of the patient were reviewed by me and considered in my medical decision making (see chart for details).       Patient is a  47 y.o. femalewith history hemorrhoids, MVP, HTN, HLD who presents after having insidious severe LLQ abdominal pain for the past 2 days.  Overall, patient is uncomfortable appearing but well-hydrated. Christinais afebrile and satting well on room air.  Exam is concerning for an acute abdomen.  She has LLQ more than RLQ tenderness to light  palpation.  Urgent care workup truncated.    Recommended ED evaluation as she needs abdominal imaging to rule in or out abdominal abscess versus diverticulitis.  She may need to see GI to determine the cause of the melena.  Patient states she will travel by private vehicle to [facility].  [facility] emergency department and spoke with triage RN who will await patient's arrival.   Discussed MDM, treatment plan and plan for follow-up with patient who agrees with plan.    Final Clinical Impressions(s) / UC Diagnoses   Final diagnoses:  Lower abdominal pain  Melena     Discharge Instructions      You have been advised to follow up immediately in the emergency department for concerning signs or symptoms as discussed during your visit. If you declined EMS transport, please have a family member take you directly to the ED at this time. Do not delay.   Based on concerns about condition, if you do not follow up in the ED, you may risk poor outcomes including worsening of condition, delayed treatment and potentially life threatening issues. If you have declined to go to the ED at this time, you should call your PCP immediately to set up a follow up appointment.       ED Prescriptions   None    PDMP not reviewed this encounter.   Katha Cabal, DO 10/30/23 1640

## 2023-10-30 NOTE — ED Provider Notes (Signed)
[facility] Provider Note  Patient Contact: 9:01 PM (approximate)   History   Abdominal Pain   HPI  Sandra Doyle is a 47 y.o. female who dents the emergency department complaining of left lower quadrant abdominal pain.  Patient went to urgent care, was referred to the ED for labs and imaging.  Patient does have a history of diverticulitis, has never had a perforation but states that her pain is much worse than typical diverticulitis.  She has had some dark stool.  No reported emesis.     Physical Exam   Triage Vital Signs: ED Triage Vitals  Encounter Vitals Group     BP 10/30/23 1557 (!) 125/90     Systolic BP Percentile --      Diastolic BP Percentile --      Pulse Rate 10/30/23 1557 (!) 107     Resp 10/30/23 1557 18     Temp 10/30/23 1557 98.9 F (37.2 C)     Temp src --      SpO2 10/30/23 1557 98 %     Weight --      Height --      Head Circumference --      Peak Flow --      Pain Score 10/30/23 1556 5     Pain Loc --      Pain Education --      Exclude from Growth Chart --     Most recent vital signs: Vitals:   10/30/23 1557 10/30/23 2123  BP: (!) 125/90 (!) 154/87  Pulse: (!) 107 89  Resp: 18 18  Temp: 98.9 F (37.2 C)   SpO2: 98% 100%     General: Alert and in no acute distress.  Cardiovascular:  Good peripheral perfusion Respiratory: Normal respiratory effort without tachypnea or retractions. Lungs CTAB. Good air entry to the bases with no decreased or absent breath sounds. Gastrointestinal: Bowel sounds 4 quadrants.  Soft palpation.  Patient is very tender in the left lower quadrant.. No guarding or rigidity. No palpable masses. No distention. No CVA tenderness. Musculoskeletal: Full range of motion to all extremities.  Neurologic:  No gross focal neurologic deficits are appreciated.  Skin:   No rash noted Other:   ED Results / Procedures / Treatments   Labs (all labs ordered are listed, but only abnormal  results are displayed) Labs Reviewed  COMPREHENSIVE METABOLIC PANEL - Abnormal; Notable for the following components:      Result Value   Glucose, Bld 104 (*)    Calcium 10.5 (*)    AST 69 (*)    ALT 59 (*)    All other components within normal limits  CBC - Abnormal; Notable for the following components:   WBC 14.4 (*)    All other components within normal limits  URINALYSIS, ROUTINE W REFLEX MICROSCOPIC - Abnormal; Notable for the following components:   Color, Urine YELLOW (*)    APPearance HAZY (*)    Hgb urine dipstick LARGE (*)    Ketones, ur 5 (*)    All other components within normal limits  LIPASE, BLOOD     EKG     RADIOLOGY  I personally viewed, evaluated, and interpreted these images as part of my medical decision making, as well as reviewing the written report by the radiologist.  ED Provider Interpretation: CT reveals acute diverticulitis without perforation or abscess.  CT ABDOMEN PELVIS W CONTRAST  Result Date: 10/30/2023 CLINICAL DATA:  Left lower  quadrant abdominal pain. EXAM: CT ABDOMEN AND PELVIS WITH CONTRAST TECHNIQUE: Multidetector CT imaging of the abdomen and pelvis was performed using the standard protocol following bolus administration of intravenous contrast. RADIATION DOSE REDUCTION: This exam was performed according to the departmental dose-optimization program which includes automated exposure control, adjustment of the mA and/or kV according to patient size and/or use of iterative reconstruction technique. CONTRAST:  OMNIPAQUE IOHEXOL 300 MG/ML  SOLN COMPARISON:  None Available. FINDINGS: Lower chest: The visualized lung bases are clear. No intra-abdominal free air or free fluid. Hepatobiliary: The liver is unremarkable. No biliary ductal dilatation. The gallbladder is unremarkable. Pancreas: Unremarkable. No pancreatic ductal dilatation or surrounding inflammatory changes. Spleen: Normal in size without focal abnormality. Adrenals/Urinary  Tract: The adrenal glands are unremarkable there is no hydronephrosis on either side. There is symmetric enhancement and excretion of contrast by both kidneys. The visualized ureters and urinary bladder appear unremarkable. Stomach/Bowel: There is diverticulosis of the distal descending and sigmoid colon. There is active inflammatory changes abutting a distal descending colon diverticula in the left lower abdomen. No abscess or perforation. There is no bowel obstruction. The appendix is normal. Vascular/Lymphatic: Mild aortoiliac atherosclerotic disease. The IVC is unremarkable. No portal venous gas. There is no adenopathy. Reproductive: Hysterectomy.  No adnexal masses. Other: Small fat containing umbilical hernia. Musculoskeletal: Mild degenerative changes of the lower lumbar spine. No acute osseous pathology. IMPRESSION: 1. Acute diverticulitis of the distal descending colon. No abscess or perforation. 2. No bowel obstruction. Normal appendix. 3.  Aortic Atherosclerosis (ICD10-I70.0). Electronically Signed   By: Elgie Collard M.D.   On: 10/30/2023 22:08    PROCEDURES:  Critical Care performed: No  Procedures   MEDICATIONS ORDERED IN ED: Medications  iohexol (OMNIPAQUE) 300 MG/ML solution 100 mL (100 mLs Intravenous Contrast Given 10/30/23 2134)  famotidine (PEPCID) IVPB 20 mg premix (0 mg Intravenous Stopped 10/30/23 2253)  diphenhydrAMINE (BENADRYL) injection 50 mg (50 mg Intravenous Given 10/30/23 2158)  morphine (PF) 4 MG/ML injection 4 mg (4 mg Intravenous Given 10/30/23 2225)  ondansetron (ZOFRAN) injection 4 mg (4 mg Intravenous Given 10/30/23 2227)  ciprofloxacin (CIPRO) IVPB 200 mg (0 mg Intravenous Stopped 10/30/23 2339)  metroNIDAZOLE (FLAGYL) tablet 500 mg (500 mg Oral Given 10/30/23 2230)     IMPRESSION / MDM / ASSESSMENT AND PLAN / ED COURSE  I reviewed the triage vital signs and the nursing notes.                              Clinical Course as of 10/31/23 0024  Fri Oct 30, 2023  2218 Patient returned from CT with hives.  She had hives to the face, neck, abdomen.  No angioedema, wheezing, emesis. [JC]    Clinical Course User Index [JC] Rheanna Sergent, Delorise Royals, PA-C    Differential diagnosis includes, but is not limited to, colitis, diverticulitis, diverticular perforation, abscess, gastroenteritis, UTI, pyelonephritis, nephrolithiasis  Patient's presentation is most consistent with acute presentation with potential threat to life or bodily function.   Patient's diagnosis is consistent with diverticulitis.  Patient presents with left lower quadrant abdominal pain from urgent care.  Patient has a history of diverticulosis/diverticulitis.  Pain was worse than typical.  No fevers, chills, emesis.  Patient had labs and imaging.  Labs are reassuring with only a slight elevation in white blood cell count.  CT reveals findings consistent with diverticulitis without perforation or abscess.  Patient will be treated with Cipro  and Flagyl here.  After CT scan patient did develop hives.  She has tolerated CT scans with contrast in the past without any reaction, there was no meds prior to CT so I do suspect that this was an allergic reaction.  Patient was treated with Benadryl and famotidine.  Given the fact that I felt that this was likely diverticulitis with possible perforation and abscess I did not provide steroids.  Patient has improvement of her hives with Pepcid and Benadryl.  Patient will have Cipro, Flagyl, Bentyl and Colace prescribed.  Follow-up with primary care or GI as needed.  Patient is given ED precautions to return to the ED for any worsening or new symptoms.     FINAL CLINICAL IMPRESSION(S) / ED DIAGNOSES   Final diagnoses:  Diverticulitis     Rx / DC Orders   ED Discharge Orders          Ordered    ciprofloxacin (CIPRO) 500 MG tablet  2 times daily        10/30/23 2252    metroNIDAZOLE (FLAGYL) 500 MG tablet  2 times daily        10/30/23 2252     dicyclomine (BENTYL) 10 MG capsule  3 times daily before meals & bedtime        10/30/23 2252    docusate sodium (COLACE) 100 MG capsule  2 times daily        10/30/23 2252    HYDROcodone-acetaminophen (NORCO/VICODIN) 5-325 MG tablet  Every 4 hours PRN        10/30/23 2252             Note:  This document was prepared using Dragon voice recognition software and may include unintentional dictation errors.   Racheal Patches, PA-C 10/31/23 0024    Minna Antis, MD 11/02/23 [phone]

## 2023-10-30 NOTE — Discharge Instructions (Signed)

## 2023-10-30 NOTE — ED Triage Notes (Signed)
Pt to ED from UC for left sided abd pain started a couple days ago. Denies n/v/d. Denies urinary sx.

## 2023-10-30 NOTE — ED Triage Notes (Signed)
Pt c/o left side abdominal pain x4days  Pt states that it hurts to move, stand, or sit.   Pt has to use a heating pad on her lower abdomen to help with pain.   Pt states that the pain started on Tuesday night but has progressively gotten worse.   Pt has had 3 bowel movements since Tuesday and states that the color is black with green specks and soft.   Pt feels pain in her stomach while using the restroom.

## 2024-02-06 ENCOUNTER — Ambulatory Visit
Admission: RE | Admit: 2024-02-06 | Discharge: 2024-02-06 | Disposition: A | Discharge status: Home / Self Care | Payer: Medicaid Other | Source: Ambulatory Visit | Attending: Family Medicine | Admitting: Family Medicine

## 2024-02-06 VITALS — BP 123/87 | HR 76 | Temp 98.5°F | Resp 15 | Ht 63.0 in | Wt 149.9 lb

## 2024-02-06 DIAGNOSIS — K047 Periapical abscess without sinus: Secondary | ICD-10-CM | POA: Diagnosis not present

## 2024-02-06 DIAGNOSIS — L739 Follicular disorder, unspecified: Secondary | ICD-10-CM | POA: Diagnosis not present

## 2024-02-06 MED ORDER — CLINDAMYCIN HCL 300 MG PO CAPS: 300.0000 mg | ORAL_CAPSULE | Freq: Four times a day (QID) | ORAL | 0 refills | Status: AC

## 2024-02-06 MED ORDER — FLUCONAZOLE 150 MG PO TABS
150.0000 mg | ORAL_TABLET | Freq: Once | ORAL | 0 refills | Status: AC
Start: 2024-02-06 — End: 2024-02-06

## 2024-02-06 NOTE — ED Provider Notes (Signed)
 [facility]    CSN: [id number] Arrival date & time: 02/06/24  1412      History   Chief Complaint Chief Complaint  Patient presents with   Rash    HPI Anela Bensman is a 48 y.o. female.   HPI  Sophi presents for rash under her armpits. Rash started on the left armpit and spread to the right armpit. No new clothing, detergents, soaps or deodorants. Has eczema on her hands but this rash is different.    Last week, she had some nose bleeds which are unusual for her.  Has some dental pain that radiates across her right side of her face. Has some slight nasal congestion with rhinorrhea and cough.. Has painful chewing.     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   Chronic pain syndrome    Fibromyalgia    Hypercholesteremia    Hypertension    Left lumbar radiculopathy    Migraines    Mitral valve prolapse    Orthostatic syncope    Palpitations    Psoriatic arthritis (HCC)    Sacral back pain    SVT (supraventricular tachycardia) (HCC)    Synovitis and tenosynovitis     Patient Active Problem List   Diagnosis Date Noted   Cervical radiculopathy 10/07/2021   Cervical radicular pain 07/11/2021   Cervicalgia 07/11/2021   Easy bruising 04/24/2021   Chronic migraine without aura without status migrainosus, not intractable 04/11/2020   Fibromyalgia 02/16/2020   Psoriatic arthritis (HCC) 02/16/2020   Chronic pain syndrome 02/16/2020   Left lumbar radiculopathy 04/17/2016   Sacral back pain 04/17/2016    Past Surgical History:  Procedure Laterality Date   ABDOMINAL HYSTERECTOMY     CARDIAC ELECTROPHYSIOLOGY MAPPING AND ABLATION     CARDIAC SURGERY     ablasion   CERVICAL DISC ARTHROPLASTY N/A 10/07/2021   Procedure: C5/6, C6/7 ARTHROPLASTY;  Surgeon: Lucy Chris, MD;  Location: [facility];  Service: Neurosurgery;  Laterality:  N/A;   CERVICAL SPINE SURGERY     WRIST SURGERY      OB History   No obstetric history on file.      Home Medications    Prior to Admission medications   Medication Sig Start Date End Date Taking? Authorizing Provider  clindamycin (CLEOCIN) 300 MG capsule Take 1 capsule (300 mg total) by mouth 4 (four) times daily for 7 days. 02/06/24 02/13/24 Yes Morgyn Marut, DO  amphetamine-dextroamphetamine (ADDERALL) 10 MG tablet Take 10 mg by mouth 2 (two) times daily.    [provider]  ARIPiprazole (ABILIFY) 10 MG tablet Take 10 mg by mouth at bedtime. 09/10/21   [provider]  celecoxib (CELEBREX) 200 MG capsule Take 1 capsule (200 mg total) by mouth every 12 (twelve) hours as needed for moderate pain. 10/08/21   Susanne Borders, PA  cetirizine (ZYRTEC) 10 MG tablet Take 10 mg by mouth daily.    [provider]  chlorhexidine (PERIDEX) 0.12 % solution Use as directed 15 mLs in the mouth or throat 2 (two) times daily. 01/24/21   [provider]  clobetasol ointment (TEMOVATE) 0.05 % Apply 1 application topically 2 (two) times daily. 04/17/21   [provider]  dicyclomine (BENTYL) 10 MG capsule Take 1 capsule (10 mg total) by mouth 4 (four) times daily -  before meals and at bedtime. 10/30/23   Cuthriell, Delorise Royals, PA-C  docusate sodium (COLACE) 100 MG capsule Take 1 capsule (100 mg total) by mouth 2 (two) times daily. 10/30/23 10/29/24  Cuthriell, Delorise Royals, PA-C  DULoxetine (CYMBALTA) 60 MG capsule Take 1 capsule (60 mg total) by mouth daily. Patient taking differently: Take 60 mg by mouth in the morning. 09/23/21 01/21/22  Edward Jolly, MD  fluticasone (FLONASE) 50 MCG/ACT nasal spray Place 1 spray into both nostrils 2 (two) times daily as needed for allergies or rhinitis.    [provider]  folic acid (FOLVITE) 1 MG tablet Take 1 mg by mouth daily. 05/23/21   [provider]  HYDROcodone-acetaminophen (NORCO/VICODIN) 5-325 MG  tablet Take 1 tablet by mouth every 4 (four) hours as needed for moderate pain (pain score 4-6). 10/30/23 10/29/24  Cuthriell, Delorise Royals, PA-C  linaclotide (LINZESS) 145 MCG CAPS capsule Take 145 mcg by mouth at bedtime. 02/06/21   [provider]  methocarbamol (ROBAXIN) 500 MG tablet Take 1 tablet (500 mg total) by mouth every 6 (six) hours. 10/08/21   Susanne Borders, PA  nortriptyline (PAMELOR) 10 MG capsule Take 2 capsules (20 mg total) by mouth at bedtime. 09/23/21 01/21/22  Edward Jolly, MD  OPZELURA 1.5 % CREA Apply 1 application topically at bedtime. 04/11/21   [provider]  pregabalin (LYRICA) 75 MG capsule Take 1 capsule (75 mg total) by mouth 3 (three) times daily. 09/23/21 01/21/22  Edward Jolly, MD  rosuvastatin (CRESTOR) 40 MG tablet Take 40 mg by mouth every evening.    [provider]  senna (SENOKOT) 8.6 MG TABS tablet Take 1 tablet (8.6 mg total) by mouth 2 (two) times daily. 10/08/21   Susanne Borders, PA  Sod Fluoride-Potassium Nitrate (SODIUM FLUORIDE 5000 SENSITIVE) 1.1-5 % GEL Place 1 application onto teeth daily.    [provider]  sulfaSALAzine (AZULFIDINE) 500 MG EC tablet Take 1,000 mg by mouth in the morning and at bedtime. 06/20/21   [provider]  SUMAtriptan (IMITREX) 100 MG tablet Take 100 mg by mouth every 2 (two) hours as needed for migraine. 07/17/20   [provider]  tacrolimus (PROTOPIC) 0.1 % ointment Apply 1 application topically 2 (two) times daily. 04/17/21   [provider]  triamcinolone cream (KENALOG) 0.5 % Apply 1 application topically 2 (two) times daily. 04/03/20   [provider]    Family History Family History  Problem Relation Age of Onset   Hypertension Mother    Stroke Mother    Osteoarthritis Mother    Anuerysm Father     Social History Social History   Tobacco Use   Smoking status: Former    Current packs/day: 0.00    Types: Cigarettes    Quit date: 2017     Years since quitting: 8.1   Smokeless tobacco: Never  Vaping Use   Vaping status: Some Days  Substance Use Topics   Alcohol use: Yes    Comment: occasionally   Drug use: No     Allergies   Seasonal ic [cholestatin] and Ivp dye [iodinated contrast media]   Review of Systems Review of Systems :negative unless otherwise stated in HPI.      Physical Exam Triage Vital Signs ED Triage Vitals  Encounter Vitals Group     BP 02/06/24 1434 123/87     Systolic BP Percentile --      Diastolic BP Percentile --      Pulse Rate  02/06/24 1434 76     Resp 02/06/24 1434 15     Temp 02/06/24 1434 98.5 F (36.9 C)     Temp Source 02/06/24 1434 Oral     SpO2 02/06/24 1434 99 %     Weight 02/06/24 1433 149 lb 14.6 oz (68 kg)     Height 02/06/24 1433 5\' 3"  (1.6 m)     Head Circumference --      Peak Flow --      Pain Score 02/06/24 1432 2     Pain Loc --      Pain Education --      Exclude from Growth Chart --    No data found.  Updated Vital Signs BP 123/87 (BP Location: Left Arm)   Pulse 76   Temp 98.5 F (36.9 C) (Oral)   Resp 15   Ht 5\' 3"  (1.6 m)   Wt 68 kg   SpO2 99%   BMI 26.56 kg/m   Visual Acuity Right Eye Distance:   Left Eye Distance:   Bilateral Distance:    Right Eye Near:   Left Eye Near:    Bilateral Near:     Physical Exam  GEN: alert, well appearing female, in no acute distress  RESP: no increased work of breathing MSK: no extremity edema NEURO: alert, moves all extremities appropriately SKIN: warm and dry; erythematous papules and patches in the bilateral axilla     UC Treatments / Results  Labs (all labs ordered are listed, but only abnormal results are displayed) Labs Reviewed - No data to display  EKG   Radiology No results found.  Procedures Procedures (including critical care time)  Medications Ordered in UC Medications - No data to display  Initial Impression / Assessment and Plan / UC Course  I have reviewed the  triage vital signs and the nursing notes.  Pertinent labs & imaging results that were available during my care of the patient were reviewed by me and considered in my medical decision making (see chart for details).     Patient is a 48 y.o. femalewho presents for axillary rash.  Overall, patient is well-appearing and well-hydrated.  Vital signs stable.  Valerie is afebrile.  Exam concerning for folliculitis versus fungal infection.  Treat with antibiotics and Diflucan as below steroid ointment.  Regarding her jaw pain I suspect this may be dental related as she has pain with chewing.  Treat with antibiotics as below.  Follow-up with local dentist.   Reviewed expectations regarding course of current medical issues.  All questions asked were answered.  Outlined signs and symptoms indicating need for more acute intervention. Patient verbalized understanding. After Visit Summary given.   Final Clinical Impressions(s) / UC Diagnoses   Final diagnoses:  Folliculitis of both axillae  Dental abscess     Discharge Instructions      At this time there is no abscess to be drained. You were prescribed an antibiotic for both your armpit rash and your suspected dental infection. Please take this exactly as directed and do not stop taking it until the entire course of medicine is finished, even if you begin to feel better before finishing the course.   Schedule an appointment with your dentist or call local dentists to see if they take your insurance or can do a payment payment plan.  [facility] [facility] are options. Consider [facility] prior to your dental appointment.   Consider the [facility]  dental school  free clinic operates from 6-9 pm on select Wednesdays:  [facility] [employer], [place] [address] [place], [place] [address] Parking Location: [facility]           ED Prescriptions     Medication Sig Dispense Auth.  Provider   clindamycin (CLEOCIN) 300 MG capsule Take 1 capsule (300 mg total) by mouth 4 (four) times daily for 7 days. 28 capsule Romelle Reiley, DO   fluconazole (DIFLUCAN) 150 MG tablet Take 1 tablet (150 mg total) by mouth once for 1 dose. 1 tablet Katha Cabal, DO      PDMP not reviewed this encounter.              Katha Cabal, DO 02/08/24 1530

## 2024-02-06 NOTE — Discharge Instructions (Signed)
 At this time there is no abscess to be drained. You were prescribed an antibiotic for both your armpit rash and your suspected dental infection. Please take this exactly as directed and do not stop taking it until the entire course of medicine is finished, even if you begin to feel better before finishing the course.   Schedule an appointment with your dentist or call local dentists to see if they take your insurance or can do a payment payment plan.  [facility] [employer] [facility] are options. Consider [facility] prior to your dental appointment.   Consider the [facility] dental school  free clinic operates from 6-9 pm on select Wednesdays:  [facility] [employer], [place] [address] [place], [place] [address] Parking Location: [facility]

## 2024-02-06 NOTE — ED Triage Notes (Signed)
 Patient reports a nonitchy rash under her arms for the past week.  Patient reports right sided sinus pain and pressure that started 2 days ago.  Patient denies fevers.
